# Patient Record
Sex: Female | Born: 1935 | Race: White | Hispanic: No | Marital: Married | Smoking: Never smoker
Health system: Southern US, Community
[De-identification: ages and names within clinical notes are randomized; demographics above are authoritative.]

## PROBLEM LIST (undated history)

## (undated) DIAGNOSIS — E119 Type 2 diabetes mellitus without complications: Secondary | ICD-10-CM

## (undated) DIAGNOSIS — E079 Disorder of thyroid, unspecified: Secondary | ICD-10-CM

## (undated) DIAGNOSIS — H409 Unspecified glaucoma: Secondary | ICD-10-CM

## (undated) DIAGNOSIS — M1990 Unspecified osteoarthritis, unspecified site: Secondary | ICD-10-CM

## (undated) DIAGNOSIS — F32A Depression, unspecified: Secondary | ICD-10-CM

## (undated) DIAGNOSIS — I10 Essential (primary) hypertension: Secondary | ICD-10-CM

## (undated) DIAGNOSIS — F419 Anxiety disorder, unspecified: Secondary | ICD-10-CM

## (undated) DIAGNOSIS — I4891 Unspecified atrial fibrillation: Secondary | ICD-10-CM

## (undated) HISTORY — PX: JOINT REPLACEMENT: SHX530

---

## 1997-12-02 ENCOUNTER — Other Ambulatory Visit: Admission: RE | Admit: 1997-12-02 | Discharge: 1997-12-02 | Payer: Self-pay | Admitting: Obstetrics and Gynecology

## 2004-10-31 ENCOUNTER — Ambulatory Visit: Payer: Self-pay | Admitting: Family Medicine

## 2004-11-23 ENCOUNTER — Ambulatory Visit: Payer: Self-pay | Admitting: Family Medicine

## 2004-12-17 ENCOUNTER — Ambulatory Visit: Payer: Self-pay | Admitting: Family Medicine

## 2005-01-02 ENCOUNTER — Ambulatory Visit: Payer: Self-pay | Admitting: Family Medicine

## 2005-02-12 ENCOUNTER — Ambulatory Visit: Payer: Self-pay | Admitting: Family Medicine

## 2005-04-24 ENCOUNTER — Ambulatory Visit: Payer: Self-pay | Admitting: Family Medicine

## 2005-06-14 ENCOUNTER — Ambulatory Visit: Payer: Self-pay | Admitting: Family Medicine

## 2005-07-25 ENCOUNTER — Ambulatory Visit: Payer: Self-pay | Admitting: Family Medicine

## 2005-08-08 ENCOUNTER — Ambulatory Visit: Payer: Self-pay | Admitting: Family Medicine

## 2005-09-13 ENCOUNTER — Ambulatory Visit: Payer: Self-pay | Admitting: Family Medicine

## 2005-11-08 ENCOUNTER — Ambulatory Visit: Payer: Self-pay | Admitting: Family Medicine

## 2017-06-09 DIAGNOSIS — R531 Weakness: Secondary | ICD-10-CM | POA: Diagnosis not present

## 2017-06-09 DIAGNOSIS — R197 Diarrhea, unspecified: Secondary | ICD-10-CM | POA: Diagnosis not present

## 2017-06-09 DIAGNOSIS — N12 Tubulo-interstitial nephritis, not specified as acute or chronic: Secondary | ICD-10-CM

## 2017-06-09 DIAGNOSIS — N179 Acute kidney failure, unspecified: Secondary | ICD-10-CM

## 2017-06-10 DIAGNOSIS — N179 Acute kidney failure, unspecified: Secondary | ICD-10-CM | POA: Diagnosis not present

## 2017-06-10 DIAGNOSIS — R197 Diarrhea, unspecified: Secondary | ICD-10-CM | POA: Diagnosis not present

## 2017-06-10 DIAGNOSIS — N12 Tubulo-interstitial nephritis, not specified as acute or chronic: Secondary | ICD-10-CM | POA: Diagnosis not present

## 2017-06-11 DIAGNOSIS — E785 Hyperlipidemia, unspecified: Secondary | ICD-10-CM | POA: Diagnosis not present

## 2017-06-11 DIAGNOSIS — I10 Essential (primary) hypertension: Secondary | ICD-10-CM | POA: Diagnosis not present

## 2017-06-11 DIAGNOSIS — N179 Acute kidney failure, unspecified: Secondary | ICD-10-CM | POA: Diagnosis not present

## 2017-06-11 DIAGNOSIS — N12 Tubulo-interstitial nephritis, not specified as acute or chronic: Secondary | ICD-10-CM | POA: Diagnosis not present

## 2017-06-11 DIAGNOSIS — R197 Diarrhea, unspecified: Secondary | ICD-10-CM | POA: Diagnosis not present

## 2017-06-11 DIAGNOSIS — R531 Weakness: Secondary | ICD-10-CM | POA: Diagnosis not present

## 2017-06-11 DIAGNOSIS — E1169 Type 2 diabetes mellitus with other specified complication: Secondary | ICD-10-CM | POA: Diagnosis not present

## 2017-06-12 DIAGNOSIS — E785 Hyperlipidemia, unspecified: Secondary | ICD-10-CM | POA: Diagnosis not present

## 2017-06-12 DIAGNOSIS — N179 Acute kidney failure, unspecified: Secondary | ICD-10-CM | POA: Diagnosis not present

## 2017-06-12 DIAGNOSIS — E1169 Type 2 diabetes mellitus with other specified complication: Secondary | ICD-10-CM | POA: Diagnosis not present

## 2017-06-12 DIAGNOSIS — R197 Diarrhea, unspecified: Secondary | ICD-10-CM | POA: Diagnosis not present

## 2017-06-12 DIAGNOSIS — I10 Essential (primary) hypertension: Secondary | ICD-10-CM | POA: Diagnosis not present

## 2017-06-12 DIAGNOSIS — R531 Weakness: Secondary | ICD-10-CM | POA: Diagnosis not present

## 2017-06-12 DIAGNOSIS — N12 Tubulo-interstitial nephritis, not specified as acute or chronic: Secondary | ICD-10-CM | POA: Diagnosis not present

## 2020-08-24 DIAGNOSIS — I340 Nonrheumatic mitral (valve) insufficiency: Secondary | ICD-10-CM | POA: Diagnosis not present

## 2020-08-24 DIAGNOSIS — I361 Nonrheumatic tricuspid (valve) insufficiency: Secondary | ICD-10-CM | POA: Diagnosis not present

## 2020-08-26 DIAGNOSIS — G459 Transient cerebral ischemic attack, unspecified: Secondary | ICD-10-CM

## 2020-08-30 ENCOUNTER — Emergency Department (HOSPITAL_COMMUNITY): Payer: Medicare Other

## 2020-08-30 ENCOUNTER — Encounter (HOSPITAL_COMMUNITY): Payer: Self-pay | Admitting: *Deleted

## 2020-08-30 ENCOUNTER — Other Ambulatory Visit: Payer: Self-pay

## 2020-08-30 ENCOUNTER — Inpatient Hospital Stay (HOSPITAL_COMMUNITY)
Admission: EM | Admit: 2020-08-30 | Discharge: 2020-09-17 | DRG: 853 | Disposition: A | Discharge status: Skilled Nursing Facility | Payer: Medicare Other | Attending: Family Medicine | Admitting: Family Medicine

## 2020-08-30 DIAGNOSIS — R791 Abnormal coagulation profile: Secondary | ICD-10-CM | POA: Diagnosis present

## 2020-08-30 DIAGNOSIS — I951 Orthostatic hypotension: Secondary | ICD-10-CM | POA: Diagnosis present

## 2020-08-30 DIAGNOSIS — Z7401 Bed confinement status: Secondary | ICD-10-CM

## 2020-08-30 DIAGNOSIS — A419 Sepsis, unspecified organism: Secondary | ICD-10-CM | POA: Diagnosis not present

## 2020-08-30 DIAGNOSIS — Z79890 Hormone replacement therapy (postmenopausal): Secondary | ICD-10-CM

## 2020-08-30 DIAGNOSIS — C3492 Malignant neoplasm of unspecified part of left bronchus or lung: Secondary | ICD-10-CM

## 2020-08-30 DIAGNOSIS — Z79899 Other long term (current) drug therapy: Secondary | ICD-10-CM

## 2020-08-30 DIAGNOSIS — F419 Anxiety disorder, unspecified: Secondary | ICD-10-CM | POA: Insufficient documentation

## 2020-08-30 DIAGNOSIS — Z7982 Long term (current) use of aspirin: Secondary | ICD-10-CM

## 2020-08-30 DIAGNOSIS — R6520 Severe sepsis without septic shock: Secondary | ICD-10-CM

## 2020-08-30 DIAGNOSIS — J42 Unspecified chronic bronchitis: Secondary | ICD-10-CM | POA: Diagnosis present

## 2020-08-30 DIAGNOSIS — M48061 Spinal stenosis, lumbar region without neurogenic claudication: Secondary | ICD-10-CM | POA: Diagnosis present

## 2020-08-30 DIAGNOSIS — R531 Weakness: Secondary | ICD-10-CM

## 2020-08-30 DIAGNOSIS — K5900 Constipation, unspecified: Secondary | ICD-10-CM | POA: Diagnosis present

## 2020-08-30 DIAGNOSIS — Z23 Encounter for immunization: Secondary | ICD-10-CM

## 2020-08-30 DIAGNOSIS — J189 Pneumonia, unspecified organism: Secondary | ICD-10-CM | POA: Diagnosis present

## 2020-08-30 DIAGNOSIS — R059 Cough, unspecified: Secondary | ICD-10-CM

## 2020-08-30 DIAGNOSIS — F32A Depression, unspecified: Secondary | ICD-10-CM | POA: Diagnosis present

## 2020-08-30 DIAGNOSIS — Z8673 Personal history of transient ischemic attack (TIA), and cerebral infarction without residual deficits: Secondary | ICD-10-CM

## 2020-08-30 DIAGNOSIS — W1830XA Fall on same level, unspecified, initial encounter: Secondary | ICD-10-CM | POA: Diagnosis present

## 2020-08-30 DIAGNOSIS — E871 Hypo-osmolality and hyponatremia: Secondary | ICD-10-CM | POA: Diagnosis present

## 2020-08-30 DIAGNOSIS — R609 Edema, unspecified: Secondary | ICD-10-CM

## 2020-08-30 DIAGNOSIS — R52 Pain, unspecified: Secondary | ICD-10-CM

## 2020-08-30 DIAGNOSIS — R937 Abnormal findings on diagnostic imaging of other parts of musculoskeletal system: Secondary | ICD-10-CM

## 2020-08-30 DIAGNOSIS — G8929 Other chronic pain: Secondary | ICD-10-CM | POA: Diagnosis present

## 2020-08-30 DIAGNOSIS — Z7984 Long term (current) use of oral hypoglycemic drugs: Secondary | ICD-10-CM

## 2020-08-30 DIAGNOSIS — I480 Paroxysmal atrial fibrillation: Secondary | ICD-10-CM | POA: Insufficient documentation

## 2020-08-30 DIAGNOSIS — E039 Hypothyroidism, unspecified: Secondary | ICD-10-CM

## 2020-08-30 DIAGNOSIS — F061 Catatonic disorder due to known physiological condition: Secondary | ICD-10-CM | POA: Diagnosis not present

## 2020-08-30 DIAGNOSIS — N390 Urinary tract infection, site not specified: Secondary | ICD-10-CM | POA: Diagnosis present

## 2020-08-30 DIAGNOSIS — E119 Type 2 diabetes mellitus without complications: Secondary | ICD-10-CM | POA: Diagnosis present

## 2020-08-30 DIAGNOSIS — E669 Obesity, unspecified: Secondary | ICD-10-CM | POA: Diagnosis present

## 2020-08-30 DIAGNOSIS — I10 Essential (primary) hypertension: Secondary | ICD-10-CM | POA: Diagnosis present

## 2020-08-30 DIAGNOSIS — M5136 Other intervertebral disc degeneration, lumbar region: Secondary | ICD-10-CM | POA: Diagnosis present

## 2020-08-30 DIAGNOSIS — G3184 Mild cognitive impairment, so stated: Secondary | ICD-10-CM | POA: Diagnosis present

## 2020-08-30 DIAGNOSIS — E785 Hyperlipidemia, unspecified: Secondary | ICD-10-CM | POA: Diagnosis present

## 2020-08-30 DIAGNOSIS — G9341 Metabolic encephalopathy: Secondary | ICD-10-CM | POA: Diagnosis not present

## 2020-08-30 DIAGNOSIS — M79641 Pain in right hand: Secondary | ICD-10-CM

## 2020-08-30 DIAGNOSIS — A4189 Other specified sepsis: Secondary | ICD-10-CM | POA: Diagnosis not present

## 2020-08-30 DIAGNOSIS — I959 Hypotension, unspecified: Secondary | ICD-10-CM

## 2020-08-30 DIAGNOSIS — Z20822 Contact with and (suspected) exposure to covid-19: Secondary | ICD-10-CM | POA: Diagnosis present

## 2020-08-30 DIAGNOSIS — S52611A Displaced fracture of right ulna styloid process, initial encounter for closed fracture: Secondary | ICD-10-CM | POA: Diagnosis present

## 2020-08-30 DIAGNOSIS — R627 Adult failure to thrive: Secondary | ICD-10-CM | POA: Diagnosis present

## 2020-08-30 DIAGNOSIS — M4186 Other forms of scoliosis, lumbar region: Secondary | ICD-10-CM | POA: Diagnosis present

## 2020-08-30 DIAGNOSIS — Z96651 Presence of right artificial knee joint: Secondary | ICD-10-CM | POA: Diagnosis present

## 2020-08-30 DIAGNOSIS — Z66 Do not resuscitate: Secondary | ICD-10-CM | POA: Diagnosis present

## 2020-08-30 HISTORY — DX: Unspecified atrial fibrillation: I48.91

## 2020-08-30 HISTORY — DX: Unspecified osteoarthritis, unspecified site: M19.90

## 2020-08-30 HISTORY — DX: Essential (primary) hypertension: I10

## 2020-08-30 HISTORY — DX: Depression, unspecified: F32.A

## 2020-08-30 HISTORY — DX: Unspecified glaucoma: H40.9

## 2020-08-30 HISTORY — DX: Type 2 diabetes mellitus without complications: E11.9

## 2020-08-30 HISTORY — DX: Anxiety disorder, unspecified: F41.9

## 2020-08-30 HISTORY — DX: Disorder of thyroid, unspecified: E07.9

## 2020-08-30 LAB — COMPREHENSIVE METABOLIC PANEL
ALT: 34 U/L (ref 0–44)
AST: 94 U/L — ABNORMAL HIGH (ref 15–41)
Albumin: 2.7 g/dL — ABNORMAL LOW (ref 3.5–5.0)
Alkaline Phosphatase: 63 U/L (ref 38–126)
Anion gap: 12 (ref 5–15)
BUN: 30 mg/dL — ABNORMAL HIGH (ref 8–23)
CO2: 21 mmol/L — ABNORMAL LOW (ref 22–32)
Calcium: 8.5 mg/dL — ABNORMAL LOW (ref 8.9–10.3)
Chloride: 97 mmol/L — ABNORMAL LOW (ref 98–111)
Creatinine, Ser: 1.25 mg/dL — ABNORMAL HIGH (ref 0.44–1.00)
GFR, Estimated: 42 mL/min — ABNORMAL LOW (ref >60)
Glucose, Bld: 149 mg/dL — ABNORMAL HIGH (ref 70–99)
Potassium: 4.9 mmol/L (ref 3.5–5.1)
Sodium: 130 mmol/L — ABNORMAL LOW (ref 135–145)
Total Bilirubin: 0.8 mg/dL (ref 0.3–1.2)
Total Protein: 6.3 g/dL — ABNORMAL LOW (ref 6.5–8.1)

## 2020-08-30 LAB — CBC WITH DIFFERENTIAL/PLATELET
Abs Immature Granulocytes: 0.11 10*3/uL — ABNORMAL HIGH (ref 0.00–0.07)
Basophils Absolute: 0 10*3/uL (ref 0.0–0.1)
Basophils Relative: 0 %
Eosinophils Absolute: 0 10*3/uL (ref 0.0–0.5)
Eosinophils Relative: 0 %
HCT: 38.7 % (ref 36.0–46.0)
Hemoglobin: 12.8 g/dL (ref 12.0–15.0)
Immature Granulocytes: 1 %
Lymphocytes Relative: 6 %
Lymphs Abs: 1 10*3/uL (ref 0.7–4.0)
MCH: 31.3 pg (ref 26.0–34.0)
MCHC: 33.1 g/dL (ref 30.0–36.0)
MCV: 94.6 fL (ref 80.0–100.0)
Monocytes Absolute: 1.5 10*3/uL — ABNORMAL HIGH (ref 0.1–1.0)
Monocytes Relative: 9 %
Neutro Abs: 14.2 10*3/uL — ABNORMAL HIGH (ref 1.7–7.7)
Neutrophils Relative %: 84 %
Platelets: 210 10*3/uL (ref 150–400)
RBC: 4.09 MIL/uL (ref 3.87–5.11)
RDW: 14.9 % (ref 11.5–15.5)
WBC: 16.9 10*3/uL — ABNORMAL HIGH (ref 4.0–10.5)
nRBC: 0 % (ref 0.0–0.2)

## 2020-08-30 LAB — LACTIC ACID, PLASMA
Lactic Acid, Venous: 2.4 mmol/L (ref 0.5–1.9)
Lactic Acid, Venous: 3 mmol/L (ref 0.5–1.9)

## 2020-08-30 LAB — RESP PANEL BY RT-PCR (FLU A&B, COVID) ARPGX2
Influenza A by PCR: NEGATIVE
Influenza B by PCR: NEGATIVE
SARS Coronavirus 2 by RT PCR: NEGATIVE

## 2020-08-30 LAB — URINALYSIS, ROUTINE W REFLEX MICROSCOPIC
Bilirubin Urine: NEGATIVE
Glucose, UA: 50 mg/dL — AB
Ketones, ur: NEGATIVE mg/dL
Leukocytes,Ua: NEGATIVE
Nitrite: NEGATIVE
Protein, ur: 100 mg/dL — AB
Specific Gravity, Urine: 1.027 (ref 1.005–1.030)
pH: 5 (ref 5.0–8.0)

## 2020-08-30 LAB — PROTIME-INR
INR: 1.3 — ABNORMAL HIGH (ref 0.8–1.2)
Prothrombin Time: 15.5 seconds — ABNORMAL HIGH (ref 11.4–15.2)

## 2020-08-30 LAB — TROPONIN I (HIGH SENSITIVITY)
Troponin I (High Sensitivity): 20 ng/L — ABNORMAL HIGH (ref <18)
Troponin I (High Sensitivity): 22 ng/L — ABNORMAL HIGH (ref <18)

## 2020-08-30 LAB — MAGNESIUM: Magnesium: 2.1 mg/dL (ref 1.7–2.4)

## 2020-08-30 LAB — APTT: aPTT: 25 seconds (ref 24–36)

## 2020-08-30 MED ORDER — SODIUM CHLORIDE 0.9% FLUSH
3.0000 mL | Freq: Two times a day (BID) | INTRAVENOUS | Status: DC
  Administered 2020-08-31 – 2020-09-16 (×32): 3 mL via INTRAVENOUS

## 2020-08-30 MED ORDER — ACETAMINOPHEN 325 MG PO TABS
650.0000 mg | ORAL_TABLET | Freq: Four times a day (QID) | ORAL | Status: DC | PRN
  Administered 2020-09-01 – 2020-09-16 (×20): 650 mg via ORAL
  Filled 2020-08-30 (×21): qty 2

## 2020-08-30 MED ORDER — SODIUM CHLORIDE 0.9 % IV BOLUS
1500.0000 mL | Freq: Once | INTRAVENOUS | Status: AC
  Administered 2020-08-30: 20:00:00 1500 mL via INTRAVENOUS

## 2020-08-30 MED ORDER — LEVOTHYROXINE SODIUM 50 MCG PO TABS
50.0000 ug | ORAL_TABLET | Freq: Every day | ORAL | Status: DC
  Administered 2020-08-31 – 2020-09-17 (×16): 50 ug via ORAL
  Filled 2020-08-30 (×17): qty 1

## 2020-08-30 MED ORDER — ENOXAPARIN SODIUM 40 MG/0.4ML ~~LOC~~ SOLN
40.0000 mg | SUBCUTANEOUS | Status: DC
  Administered 2020-08-30 – 2020-09-03 (×5): 40 mg via SUBCUTANEOUS
  Filled 2020-08-30 (×5): qty 0.4

## 2020-08-30 MED ORDER — CLONAZEPAM 0.5 MG PO TABS
0.5000 mg | ORAL_TABLET | Freq: Every day | ORAL | Status: DC
  Administered 2020-08-30 – 2020-09-05 (×7): 0.5 mg via ORAL
  Filled 2020-08-30 (×7): qty 1

## 2020-08-30 MED ORDER — SODIUM CHLORIDE 0.9 % IV SOLN
2.0000 g | Freq: Two times a day (BID) | INTRAVENOUS | Status: DC
  Administered 2020-08-30 – 2020-09-02 (×6): 2 g via INTRAVENOUS
  Filled 2020-08-30 (×6): qty 2

## 2020-08-30 MED ORDER — SODIUM CHLORIDE 0.9 % IV BOLUS (SEPSIS)
1000.0000 mL | Freq: Once | INTRAVENOUS | Status: AC
  Administered 2020-08-30: 20:00:00 1000 mL via INTRAVENOUS

## 2020-08-30 MED ORDER — POLYETHYLENE GLYCOL 3350 17 G PO PACK
17.0000 g | PACK | Freq: Every day | ORAL | Status: DC | PRN
  Administered 2020-09-04 – 2020-09-14 (×5): 17 g via ORAL
  Filled 2020-08-30 (×4): qty 1

## 2020-08-30 MED ORDER — ACETAMINOPHEN 650 MG RE SUPP: 650.0000 mg | Freq: Four times a day (QID) | RECTAL | Status: DC | PRN

## 2020-08-30 MED ORDER — CARVEDILOL 3.125 MG PO TABS
3.1250 mg | ORAL_TABLET | Freq: Two times a day (BID) | ORAL | Status: DC
  Administered 2020-08-31 – 2020-09-17 (×34): 3.125 mg via ORAL
  Filled 2020-08-30 (×37): qty 1

## 2020-08-30 MED ORDER — INSULIN ASPART 100 UNIT/ML ~~LOC~~ SOLN
0.0000 [IU] | Freq: Three times a day (TID) | SUBCUTANEOUS | Status: DC
  Administered 2020-08-31: 17:00:00 2 [IU] via SUBCUTANEOUS
  Administered 2020-08-31: 08:00:00 3 [IU] via SUBCUTANEOUS
  Administered 2020-09-01 (×2): 2 [IU] via SUBCUTANEOUS
  Administered 2020-09-01: 13:00:00 5 [IU] via SUBCUTANEOUS
  Administered 2020-09-02 (×3): 2 [IU] via SUBCUTANEOUS
  Administered 2020-09-03: 19:00:00 3 [IU] via SUBCUTANEOUS
  Administered 2020-09-03: 10:00:00 2 [IU] via SUBCUTANEOUS
  Administered 2020-09-03: 13:00:00 3 [IU] via SUBCUTANEOUS
  Administered 2020-09-04: 14:00:00 5 [IU] via SUBCUTANEOUS
  Administered 2020-09-04: 08:00:00 2 [IU] via SUBCUTANEOUS
  Administered 2020-09-05 (×2): 3 [IU] via SUBCUTANEOUS
  Administered 2020-09-05: 09:00:00 2 [IU] via SUBCUTANEOUS
  Administered 2020-09-06: 14:00:00 11 [IU] via SUBCUTANEOUS
  Administered 2020-09-06 – 2020-09-07 (×2): 3 [IU] via SUBCUTANEOUS
  Administered 2020-09-07: 18:00:00 8 [IU] via SUBCUTANEOUS
  Administered 2020-09-07 – 2020-09-08 (×2): 5 [IU] via SUBCUTANEOUS
  Administered 2020-09-08 – 2020-09-09 (×3): 3 [IU] via SUBCUTANEOUS
  Administered 2020-09-10 (×2): 2 [IU] via SUBCUTANEOUS
  Administered 2020-09-10: 5 [IU] via SUBCUTANEOUS
  Administered 2020-09-11: 2 [IU] via SUBCUTANEOUS
  Administered 2020-09-11: 3 [IU] via SUBCUTANEOUS
  Administered 2020-09-12: 2 [IU] via SUBCUTANEOUS
  Administered 2020-09-12: 3 [IU] via SUBCUTANEOUS
  Administered 2020-09-12 – 2020-09-13 (×2): 2 [IU] via SUBCUTANEOUS
  Administered 2020-09-13: 3 [IU] via SUBCUTANEOUS
  Administered 2020-09-13 – 2020-09-14 (×2): 2 [IU] via SUBCUTANEOUS
  Administered 2020-09-14: 3 [IU] via SUBCUTANEOUS
  Administered 2020-09-14 – 2020-09-17 (×7): 2 [IU] via SUBCUTANEOUS

## 2020-08-30 MED ORDER — LACTATED RINGERS IV SOLN: INTRAVENOUS | Status: AC

## 2020-08-30 NOTE — ED Provider Notes (Signed)
[name] EMERGENCY DEPARTMENT Provider Note   CSN: [id number] Arrival date & time: 08/30/20  1703     History CC:  Confusion, hypotension  Ashlee Greer is a 86 y.o. female with a history of A. fib present emergency department with hypotension and weakness.  Patient cannot provide a reliable history, but her daughter is present at bedside and does provide additional history.  Her daughter reports the patient was discharged from [facility] 1 day ago, after being admitted and treated for TIA, as well as pneumonia and UTI.  Her daughter reports the patient had a CT scan and MRI of the brain which was "normal".  The patient was also noted while in the hospital to have profound orthostatic hypotension, and had been referred for SNF placement, but the family instead felt they can manage it at home.  However upon discharge home, they noted the patient blood pressure dropped severely when she was attempting to stand up during PT, to the point the patient had syncope today.  Her daughter is a Marine scientist and states that she was not prepared for this level of management and believes the patient needed further management in the hospital before stabilization for discharge.  Her daughter also reports me the patient is being treated with Levaquin for pneumonia as well as Bactrim for cystitis while in the hospital.  The patient has a history of A. fib and her daughter believes that she is in A. fib all the time, reports that she was in A. fib throughout her hospital stay at [facility].  She states the patient is not currently taking any anticoagulation, although she had been Eliquis in the past.  He said the doctors to discuss this but decided not to initiate any further anticoagulation while in the hospital.  Her daughter confirms the only blood pressure medication the patient on his Coreg 3.125 mg BID which she has been compliant with.  HPI     Past Medical History:  Diagnosis Date   . A-fib ([place])   . Anxiety   . Arthritis   . Depression   . Diabetes mellitus without complication ([place])   . Glaucoma   . Hypertension   . Thyroid disease    hypo    Patient Active Problem List   Diagnosis Date Noted  . Severe sepsis ([place]) 08/30/2020  . Acute lower UTI 08/30/2020  . Pneumonia 08/30/2020  . Orthostatic hypotension 08/30/2020  . History of TIA (transient ischemic attack) 08/30/2020  . Unspecified atrial fibrillation ([place]) 08/30/2020  . Anxiety 08/30/2020  . Depression 08/30/2020  . Diabetes ([place]) 08/30/2020  . Hypothyroidism 08/30/2020     OB History   No obstetric history on file.     No family history on file.  Social History   Tobacco Use  . Smoking status: Never Smoker  . Smokeless tobacco: Never Used  Substance Use Topics  . Alcohol use: Not Currently  . Drug use: Not Currently    Home Medications Prior to Admission medications   Medication Sig Start Date End Date Taking? Authorizing Provider  aspirin 325 MG tablet Take 325 mg by mouth at bedtime.   Yes [provider]  carvedilol (COREG) 3.125 MG tablet Take 3.125 mg by mouth 2 (two) times daily with a meal.   Yes [provider]  citalopram (CELEXA) 40 MG tablet Take 40 mg by mouth at bedtime.   Yes [provider]  clonazePAM (KLONOPIN) 2 MG tablet Take 2 mg by mouth  at bedtime.   Yes [provider]  donepezil (ARICEPT) 10 MG tablet Take 10 mg by mouth at bedtime.   Yes [provider]  latanoprost (XALATAN) 0.005 % ophthalmic solution Place 1 drop into both eyes at bedtime.   Yes [provider]  levofloxacin (LEVAQUIN) 500 MG tablet Take 500 mg by mouth daily.   Yes [provider]  levothyroxine (SYNTHROID) 50 MCG tablet Take 50 mcg by mouth daily before breakfast.   Yes [provider]  metFORMIN (GLUCOPHAGE) 1000 MG tablet Take 1,000 mg by mouth 2 (two) times daily with a meal.   Yes [provider]   nortriptyline (PAMELOR) 10 MG capsule Take 20 mg by mouth at bedtime.   Yes [provider]  pantoprazole (PROTONIX) 40 MG tablet Take 40 mg by mouth daily.   Yes [provider]  psyllium (METAMUCIL) 58.6 % packet Take 1 packet by mouth at bedtime.   Yes [provider]  sulfamethoxazole-trimethoprim (BACTRIM DS) 800-160 MG tablet Take 1 tablet by mouth 2 (two) times daily.   Yes [provider]    Allergies    Nisoldipine, Penicillin g, Ace inhibitors, and Eliquis [apixaban]  Review of Systems   Review of Systems  Unable to perform ROS: Mental status change (level 5 caveat)    Physical Exam Updated Vital Signs BP (!) 132/96   Pulse (!) 108   Temp 98.4 F (36.9 C) (Oral)   Resp 19   Ht 5\' 1"  (1.549 m)   Wt 88.5 kg   SpO2 100%   BMI 36.84 kg/m   Physical Exam Vitals and nursing note reviewed.  Constitutional:      General: She is not in acute distress.    Appearance: She is well-developed and well-nourished.  HENT:     Head: Normocephalic and atraumatic.  Eyes:     Conjunctiva/sclera: Conjunctivae normal.  Cardiovascular:     Rate and Rhythm: Tachycardia present. Rhythm irregular.     Pulses: Normal pulses.     Comments: HR 105 Pulmonary:     Effort: Pulmonary effort is normal. No respiratory distress.     Breath sounds: Normal breath sounds.  Abdominal:     General: There is no distension.     Palpations: Abdomen is soft.     Tenderness: There is no abdominal tenderness.  Musculoskeletal:        General: No edema.     Cervical back: Neck supple.  Skin:    General: Skin is warm and dry.  Neurological:     General: No focal deficit present.     Mental Status: She is alert and oriented to person, place, and time.  Psychiatric:        Mood and Affect: Mood and affect normal.     ED Results / Procedures / Treatments   Labs (all labs ordered are listed, but only abnormal results are displayed) Labs Reviewed  LACTIC ACID,  PLASMA - Abnormal; Notable for the following components:      Result Value   Lactic Acid, Venous 2.4 (*)    All other components within normal limits  LACTIC ACID, PLASMA - Abnormal; Notable for the following components:   Lactic Acid, Venous 3.0 (*)    All other components within normal limits  COMPREHENSIVE METABOLIC PANEL - Abnormal; Notable for the following components:   Sodium 130 (*)    Chloride 97 (*)    CO2 21 (*)    Glucose, Bld 149 (*)  BUN 30 (*)    Creatinine, Ser 1.25 (*)    Calcium 8.5 (*)    Total Protein 6.3 (*)    Albumin 2.7 (*)    AST 94 (*)    GFR, Estimated 42 (*)    All other components within normal limits  CBC WITH DIFFERENTIAL/PLATELET - Abnormal; Notable for the following components:   WBC 16.9 (*)    Neutro Abs 14.2 (*)    Monocytes Absolute 1.5 (*)    Abs Immature Granulocytes 0.11 (*)    All other components within normal limits  PROTIME-INR - Abnormal; Notable for the following components:   Prothrombin Time 15.5 (*)    INR 1.3 (*)    All other components within normal limits  URINALYSIS, ROUTINE W REFLEX MICROSCOPIC - Abnormal; Notable for the following components:   Color, Urine AMBER (*)    APPearance HAZY (*)    Glucose, UA 50 (*)    Hgb urine dipstick SMALL (*)    Protein, ur 100 (*)    Bacteria, UA RARE (*)    All other components within normal limits  TROPONIN I (HIGH SENSITIVITY) - Abnormal; Notable for the following components:   Troponin I (High Sensitivity) 20 (*)    All other components within normal limits  TROPONIN I (HIGH SENSITIVITY) - Abnormal; Notable for the following components:   Troponin I (High Sensitivity) 22 (*)    All other components within normal limits  CULTURE, BLOOD (SINGLE)  RESP PANEL BY RT-PCR (FLU A&B, COVID) ARPGX2  URINE CULTURE  CULTURE, BLOOD (SINGLE)  MRSA PCR SCREENING  APTT  MAGNESIUM  PROTIME-INR  PROCALCITONIN  TSH  COMPREHENSIVE METABOLIC PANEL  CBC  LACTIC ACID, PLASMA  LACTIC ACID,  PLASMA  OSMOLALITY    EKG EKG Interpretation  Date/Time:  Wednesday August 30 2020 17:20:07 EST Ventricular Rate:  108 PR Interval:    QRS Duration: 86 QT Interval:  320 QTC Calculation: 421 R Axis:   3 Text Interpretation: Atrial fibrillation Ventricular premature complex Borderline low voltage, extremity leads No STEMI Confirmed by Octaviano Glow [phone]) on 08/30/2020 5:23:51 PM   Radiology DG Chest Port 1 View  Result Date: 08/30/2020 CLINICAL DATA:  Sepsis. EXAM: PORTABLE CHEST 1 VIEW COMPARISON:  08/28/2020 FINDINGS: Cardiomegaly remains stable. Mild infiltrate or atelectasis in the left lung base appears increased since previous study. Linear opacity at the right lung base is stable and may be due to subsegmental atelectasis or scarring. IMPRESSION: Increased mild left basilar infiltrate or atelectasis. Stable right basilar atelectasis versus scarring. Stable cardiomegaly. Electronically Signed   By: Marlaine Hind M.D.   On: 08/30/2020 18:27    Procedures .Critical Care Performed by: Wyvonnia Dusky, MD Authorized by: Wyvonnia Dusky, MD   Critical care provider statement:    Critical care time (minutes):  45   Critical care was necessary to treat or prevent imminent or life-threatening deterioration of the following conditions:  Sepsis   Critical care was time spent personally by me on the following activities:  Discussions with consultants, evaluation of patient's response to treatment, examination of patient, ordering and performing treatments and interventions, ordering and review of laboratory studies, ordering and review of radiographic studies, pulse oximetry, re-evaluation of patient's condition, obtaining history from patient or surrogate and review of old charts   (including critical care time)  Medications Ordered in ED Medications  lactated ringers infusion (0 mLs Intravenous Hold 08/30/20 2103)  ceFEPIme (MAXIPIME) 2 g in sodium chloride 0.9 % 100  mL  IVPB (0 g Intravenous Stopped 08/30/20 2142)  enoxaparin (LOVENOX) injection 40 mg (40 mg Subcutaneous Given 08/30/20 2102)  sodium chloride flush (NS) 0.9 % injection 3 mL (3 mLs Intravenous Not Given 08/30/20 2030)  acetaminophen (TYLENOL) tablet 650 mg (has no administration in time range)    Or  acetaminophen (TYLENOL) suppository 650 mg (has no administration in time range)  polyethylene glycol (MIRALAX / GLYCOLAX) packet 17 g (has no administration in time range)  insulin aspart (novoLOG) injection 0-15 Units (has no administration in time range)  carvedilol (COREG) tablet 3.125 mg (has no administration in time range)  levothyroxine (SYNTHROID) tablet 50 mcg (has no administration in time range)  clonazePAM (KLONOPIN) tablet 0.5 mg (0 mg Oral Hold 08/30/20 2119)  sodium chloride 0.9 % bolus 1,000 mL (0 mLs Intravenous Stopped 08/30/20 2103)  sodium chloride 0.9 % bolus 1,500 mL (1,500 mLs Intravenous New Bag/Given 08/30/20 2011)    ED Course  I have reviewed the triage vital signs and the nursing notes.  Pertinent labs & imaging results that were available during my care of the patient were reviewed by me and considered in my medical decision making (see chart for details).  This patient presents with concern for fever, fatigue, weakness. This involves an extensive number of treatment options, and is a complaint that carries with it a high risk of complications and morbidity.  The differential diagnosis includes sepsis (poss 2/2 PNA or UTI) vs Covid-19 vs orthostatic hypotension vs ACS vs other  She is in A Fib now HR 105 bpm, BP stable.  This may be chronic per her medical hx and family's report.  Febrile on arrival - will check lactate, if elevated consider sepsis initiation.  Covid also ordered.  I ordered, reviewed, and interpreted labs, which included WBC 16.9, lactate 2.4, Trop 20 --> 22, Cr 1.25, BUN 30 I ordered medication IV cefepime for sepsis coverage (suspected urinary  or pulmonary source, PCN allergies) I ordered imaging studies which included dg chest I independently visualized and interpreted imaging which showed basilar atelectasis and the monitor tracing which showed A Fib Additional history was obtained from patient's daughter at bedside   After the interventions stated above, I reevaluated the patient and found HR and BP stable.   Clinical Course as of 08/30/20 2259  Wed Aug 30, 2020  1722 No answer from Romie Levee emergency contact when I tried calling.  Patient's PCP office note from August 2021 has only Coreg 3.125 mg BID listed as HTN medication.  No blood thinners shown.  I do not have access to her hospital records from [facility] at this time. [MT]  1946 Daughter now at bedside, updated contact info, additional hx added to note.  With lactate elevated and WBC 16.9, patient made sepsis alert, 30 cc/kg NS bolus ordered, IV zosyn ordered for PNA and UTI coverage as suspected sources.   [MT]  2002 Pt and daughter updated regarding w/u and admission for sepsis w/u [MT]    Clinical Course User Index [MT] Langston Masker Carola Rhine, MD    Final Clinical Impression(s) / ED Diagnoses Final diagnoses:  Sepsis, due to unspecified organism, unspecified whether acute organ dysfunction present ([place])  Hypotension, unspecified hypotension type  Weakness    Rx / DC Orders ED Discharge Orders    None       Wyvonnia Dusky, MD 08/30/20 2259

## 2020-08-30 NOTE — Progress Notes (Signed)
Pharmacy Antibiotic Note  Ashlee Greer is a 86 y.o. female admitted on 08/30/2020 with sepsis.  Pharmacy has been consulted for Cefepime dosing.  WBC elevated at 16.9. LA 2.4. SCr 1.25.   Plan: -Cefepime 2 gm IV Q 12 hours -Monitor CBC, renal fx, cultures and clinical progress  Height: 5\' 1"  (154.9 cm) Weight: 88.5 kg (195 lb) IBW/kg (Calculated) : 47.8  Temp (24hrs), Avg:99.9 F (37.7 C), Min:98.4 F (36.9 C), Max:101.4 F (38.6 C)  Recent Labs  Lab 08/30/20 1833  WBC 16.9*  CREATININE 1.25*  LATICACIDVEN 2.4*    Estimated Creatinine Clearance: 33.3 mL/min (A) (by C-G formula based on SCr of 1.25 mg/dL (H)).    Allergies  Allergen Reactions  . Ace Inhibitors Other (See Comments)    HYPERKALEMIA  . Eliquis [Apixaban]   . Nisoldipine Swelling  . Penicillin G Swelling    Antimicrobials this admission: Cefepime 12/22 >>   Dose adjustments this admission:  Microbiology results: 12/22 BCx:  12/22 UCx:     Thank you for allowing pharmacy to be a part of this patient's care.  Albertina Parr, PharmD., BCPS, BCCCP Clinical Pharmacist Please refer to [facility] for unit-specific pharmacist

## 2020-08-30 NOTE — H&P (Addendum)
History and Physical   Ashlee Greer EGB:[id number] DOB: Feb 07, 1933 DOA: 08/30/2020  PCP: [name], MD   Patient coming from: Her family's home  Chief Complaint: Syncope, orthostatic hypotension  HPI: Ashlee Greer is a 86 y.o. female with medical history significant of A. fib not on anticoagulation, anxiety/depression, arthritis, diabetes, glaucoma, hypertension, hypothyroidism who presents with recurrent loss of consciousness secondary to orthostatic hypotension.  Patient was recently discharged from [facility] after treatment for TIA, UTI, pneumonia.  She also had some fatigue and orthostasis while she was there.  She was discharged on Bactrim for UTI and Levaquin for her pneumonia.  She was recommended to go to an SNF at discharge but family is an Therapist, sports and thought they could handle her at home.  Family states that however protested her discharged and got an extra day because of this prior to discharge.  However, after returning home, her blood pressures consistently dropped on standing and has gotten so bad that she had episodes of syncope while working with PT.  They came to [facility] the preferred not to go back to [facility].  Patient alert and oriented to self and situation in ED.  History obtained with assistance of patient's daughter.  Patient reports some chest pain though this is recurrent for her.  She also reports cough.  She denies abdominal pain, shortness of breath, subjective fever, constipation, diarrhea, nausea.  ED Course: Vital signs in the ED significant for temperature 101.4, respiratory rate in the 20s, heart rate in the 100s, blood pressure in the 371G to 626 systolic and saturating well.  Chest x-ray showed increased left basilar infiltrate versus atelectasis.  BMP showed sodium of 130, creatinine 1.25 unclear baseline, glucose 149.  LFTs showed AST 94, calcium 8.5 which corrects with albumin of 2.7, protein 6.3.  CBC showed leukocytosis to 16.9.  PT/INR  elevated to 15.5 and 1.3 respectively.  PTT normal.  Lactic acid elevated at 2.4 and 3 on repeat prior to IV fluid bolus.  Troponin mildly elevated at 20 and 22 on repeat.  Respiratory panel for flu and Covid negative.  Pending labs include urine culture, urinalysis, blood culture.  Patient started on cefepime and given IV fluid boluses and consult placed for admission.  Review of Systems: As per HPI otherwise all other systems reviewed and are negative.  Past Medical History:  Diagnosis Date  . A-fib ([place])   . Anxiety   . Arthritis   . Depression   . Diabetes mellitus without complication ([place])   . Glaucoma   . Hypertension   . Thyroid disease    hypo    Past Surgical History:  Procedure Laterality Date  . JOINT REPLACEMENT Right    knee replacement    Social History  reports that she has never smoked. She has never used smokeless tobacco. She reports previous alcohol use. She reports previous drug use.  Allergies  Allergen Reactions  . Ace Inhibitors Other (See Comments)    HYPERKALEMIA  . Eliquis [Apixaban]     Altered mental status   . Nisoldipine Swelling  . Penicillin G Swelling    No family history on file. Reviewed admission  Prior to Admission medications   Not on File    Physical Exam: Vitals:   08/30/20 1748 08/30/20 1930 08/30/20 1945  BP:  111/74 (!) 143/87  Pulse:  (!) 126 (!) 107  Resp: (!) 27 19 (!) 22  Temp: (!) 101.4 F (38.6 C)  98.4 F (36.9  C)  TempSrc: Rectal  Oral  SpO2:  98% 94%  Weight: 88.5 kg    Height: 5\' 1"  (1.549 m)     Physical Exam Constitutional:      General: She is not in acute distress.    Appearance: Normal appearance. She is obese.  HENT:     Head: Normocephalic and atraumatic.     Mouth/Throat:     Mouth: Mucous membranes are moist.     Pharynx: Oropharynx is clear.  Eyes:     Extraocular Movements: Extraocular movements intact.     Pupils: Pupils are equal, round, and reactive to light.  Cardiovascular:      Rate and Rhythm: Tachycardia present. Rhythm irregular.     Pulses: Normal pulses.     Heart sounds: Normal heart sounds.  Pulmonary:     Effort: Pulmonary effort is normal. No respiratory distress.     Comments: Trace rhonchi at left base Abdominal:     General: Bowel sounds are normal. There is no distension.     Palpations: Abdomen is soft.     Tenderness: There is no abdominal tenderness.  Musculoskeletal:        General: No swelling or deformity.  Skin:    General: Skin is warm and dry.  Neurological:     General: No focal deficit present.     Mental Status: Mental status is at baseline.    Labs on Admission: I have personally reviewed following labs and imaging studies  CBC: Recent Labs  Lab 08/30/20 1833  WBC 16.9*  NEUTROABS 14.2*  HGB 12.8  HCT 38.7  MCV 94.6  PLT 814    Basic Metabolic Panel: Recent Labs  Lab 08/30/20 1833  NA 130*  K 4.9  CL 97*  CO2 21*  GLUCOSE 149*  BUN 30*  CREATININE 1.25*  CALCIUM 8.5*  MG 2.1    GFR: Estimated Creatinine Clearance: 33.3 mL/min (A) (by C-G formula based on SCr of 1.25 mg/dL (H)).  Liver Function Tests: Recent Labs  Lab 08/30/20 1833  AST 94*  ALT 34  ALKPHOS 63  BILITOT 0.8  PROT 6.3*  ALBUMIN 2.7*    Urine analysis:    Component Value Date/Time   COLORURINE AMBER (A) 08/30/2020 1833   APPEARANCEUR HAZY (A) 08/30/2020 1833   LABSPEC 1.027 08/30/2020 1833   PHURINE 5.0 08/30/2020 1833   GLUCOSEU 50 (A) 08/30/2020 1833   HGBUR SMALL (A) 08/30/2020 1833   BILIRUBINUR NEGATIVE 08/30/2020 1833   [place] 08/30/2020 1833   PROTEINUR 100 (A) 08/30/2020 1833   NITRITE NEGATIVE 08/30/2020 1833   LEUKOCYTESUR NEGATIVE 08/30/2020 1833    Radiological Exams on Admission: DG Chest Port 1 View  Result Date: 08/30/2020 CLINICAL DATA:  Sepsis. EXAM: PORTABLE CHEST 1 VIEW COMPARISON:  08/28/2020 FINDINGS: Cardiomegaly remains stable. Mild infiltrate or atelectasis in the left lung base  appears increased since previous study. Linear opacity at the right lung base is stable and may be due to subsegmental atelectasis or scarring. IMPRESSION: Increased mild left basilar infiltrate or atelectasis. Stable right basilar atelectasis versus scarring. Stable cardiomegaly. Electronically Signed   By: Marlaine Hind M.D.   On: 08/30/2020 18:27    EKG: Independently reviewed.  Atrial fibrillation with a rate of108 bpm.  Low voltage.  Assessment/Plan Principal Problem:   Severe sepsis (HCC) Active Problems:   Acute lower UTI   Pneumonia   Orthostatic hypotension   History of TIA (transient ischemic attack)   Unspecified atrial fibrillation ([place])  Anxiety   Depression   Diabetes ([place])   Hypothyroidism   UTI Pneumonia Severe sepsis > Patient recently discharged from outside hospital where problems included UTI and pneumonia > Has been discharged on Bactrim for UTI and Levaquin for pneumonia but has fever continues to be on hold. > We treated for severe sepsis with fever of 100.4, leukocytosis to 16.9, tachypnea in the 20s, tachycardia in the 100s, lactic acid elevated to 2.4 and 3 indicating or dysfunction. > Source is urinary versus pulmonary > Status post 2.5 L bolus in ED, lactic acid increased from 2.4 to 3, however repeat lactic acid was checked before IV fluid bolus was given. - Telemetry - Continue cefepime started in ED - MRSA PCR screen - Continue IV fluids - Repeat lactic acid and trend - Follow PT/INR - Follow-up urinalysis, urine cultures, blood cultures - Trend fever curve and white count  Orthostatic hypotension > History of this at recent hospitalization, had severe episodes when discharged likely worsening in the setting of sepsis as above. > No recurrent syncopal episodes, will not repeat orthostatics at this time - We will need follow-up orthostatics prior to discharge  History of TIA - Recent work-up at outside hospital  Hypertension Atrial  fibrillation > Not on anticoagulation, despite recent TIA work-up, do not have records from this. - Continue home Coreg  CKD Hypovolemic hyponatremia > Creatinine is elevated to 1.25, daughter states that she has had known renal insufficiency.  This is likely her renal insufficiency/CKD. > Hyponatremia likely due to decreased volume, will trend, can consider checking Legionella urinary antigen considering concurrent pneumonia. - Avoid nephrotoxic agents - Trend renal function and electrolytes - Check serum Osm  Anxiety and depression > Daughter states she does very poorly she does not get her evening clonazepam > Awaiting med rec for details of medications and doses - Continue lower dose clonazepam nightly  Diabetes > On Metformin at home - SSI  Hypothyroidism - Continue home Synthroid  DVT prophylaxis: Lovenox  Code Status:   Partial, no ACLS, no CPR.  Intubation okay. Family Communication:  Discussed with daughter at bedside. Disposition Plan:   Patient is from:  Home  Anticipated DC to:  Nursing facility  Anticipated DC date:  Pending clinical course  Anticipated DC barriers: None  Consults called:  None  Admission status:  Observation, telemetry   Severity of Illness: The appropriate patient status for this patient is OBSERVATION. Observation status is judged to be reasonable and necessary in order to provide the required intensity of service to ensure the patient's safety. The patient's presenting symptoms, physical exam findings, and initial radiographic and laboratory data in the context of their medical condition is felt to place them at decreased risk for further clinical deterioration. Furthermore, it is anticipated that the patient will be medically stable for discharge from the hospital within 2 midnights of admission. The following factors support the patient status of observation.   " The patient's presenting symptoms include orthostatic hypotension, fever,  tachypnea, tachycardia. " The physical exam findings include obesity, mild rhonchi left base otherwise stable exam. " The initial radiographic and laboratory data are concerning for worsening infiltrate, elevated lactic acid, elevated white count, hyponatremia, elevated creatinine.   Marcelyn Bruins MD [facility]  How to contact the [facility] Attending or Consulting provider [place] or covering provider during after hours [place], for this patient?   1. Check the care team in [facility] and look for a) attending/consulting TR[facility] provider listed  and b) the [facility] team listed 2. Log into [email or web address] and use [place]'s universal password to access. If you do not have the password, please contact the hospital operator. 3. Locate the [facility] provider you are looking for under [facility] and page to a number that you can be directly reached. 4. If you still have difficulty reaching the provider, please page the [facility] (Director on Call) for the Hospitalists listed on amion for assistance.  08/30/2020, 9:00 PM

## 2020-08-30 NOTE — ED Notes (Signed)
Pt restated to 88% on room air. Was repositioned and sat up with no improvement in spo2 level. Pt placed on 2 L supplemental oxygen spo2 increased to 94%

## 2020-08-30 NOTE — ED Triage Notes (Signed)
Pt recently discharged from [facility] yesterday, after being tx for tia, uti and pnx.  Pt had multiple episodes of losing consciousness today, when she stood up.  Pt alert to self and situation presently.  CBG 225.

## 2020-08-30 NOTE — Sepsis Progress Note (Addendum)
Following for code sepsis monitoring. Lactic acid is rising. Repeat lactic acid trends are ordered.   Notified bedside nurse of need to draw repeat lactic acid.  Difficult stick. Continued attempts being made

## 2020-08-31 DIAGNOSIS — R4182 Altered mental status, unspecified: Secondary | ICD-10-CM | POA: Diagnosis not present

## 2020-08-31 DIAGNOSIS — M79609 Pain in unspecified limb: Secondary | ICD-10-CM | POA: Diagnosis not present

## 2020-08-31 DIAGNOSIS — Z7984 Long term (current) use of oral hypoglycemic drugs: Secondary | ICD-10-CM | POA: Diagnosis not present

## 2020-08-31 DIAGNOSIS — E785 Hyperlipidemia, unspecified: Secondary | ICD-10-CM | POA: Diagnosis present

## 2020-08-31 DIAGNOSIS — F061 Catatonic disorder due to known physiological condition: Secondary | ICD-10-CM | POA: Diagnosis not present

## 2020-08-31 DIAGNOSIS — E039 Hypothyroidism, unspecified: Secondary | ICD-10-CM | POA: Diagnosis present

## 2020-08-31 DIAGNOSIS — E119 Type 2 diabetes mellitus without complications: Secondary | ICD-10-CM | POA: Diagnosis present

## 2020-08-31 DIAGNOSIS — F419 Anxiety disorder, unspecified: Secondary | ICD-10-CM | POA: Diagnosis present

## 2020-08-31 DIAGNOSIS — C3492 Malignant neoplasm of unspecified part of left bronchus or lung: Secondary | ICD-10-CM | POA: Diagnosis present

## 2020-08-31 DIAGNOSIS — A419 Sepsis, unspecified organism: Secondary | ICD-10-CM | POA: Diagnosis not present

## 2020-08-31 DIAGNOSIS — R791 Abnormal coagulation profile: Secondary | ICD-10-CM | POA: Diagnosis present

## 2020-08-31 DIAGNOSIS — G9341 Metabolic encephalopathy: Secondary | ICD-10-CM | POA: Diagnosis not present

## 2020-08-31 DIAGNOSIS — Z7982 Long term (current) use of aspirin: Secondary | ICD-10-CM | POA: Diagnosis not present

## 2020-08-31 DIAGNOSIS — W1830XA Fall on same level, unspecified, initial encounter: Secondary | ICD-10-CM | POA: Diagnosis not present

## 2020-08-31 DIAGNOSIS — J189 Pneumonia, unspecified organism: Secondary | ICD-10-CM | POA: Diagnosis present

## 2020-08-31 DIAGNOSIS — I480 Paroxysmal atrial fibrillation: Secondary | ICD-10-CM | POA: Diagnosis not present

## 2020-08-31 DIAGNOSIS — N390 Urinary tract infection, site not specified: Secondary | ICD-10-CM | POA: Diagnosis present

## 2020-08-31 DIAGNOSIS — Z66 Do not resuscitate: Secondary | ICD-10-CM | POA: Diagnosis present

## 2020-08-31 DIAGNOSIS — Z96651 Presence of right artificial knee joint: Secondary | ICD-10-CM | POA: Diagnosis present

## 2020-08-31 DIAGNOSIS — F32A Depression, unspecified: Secondary | ICD-10-CM | POA: Diagnosis present

## 2020-08-31 DIAGNOSIS — Z8673 Personal history of transient ischemic attack (TIA), and cerebral infarction without residual deficits: Secondary | ICD-10-CM | POA: Diagnosis not present

## 2020-08-31 DIAGNOSIS — R627 Adult failure to thrive: Secondary | ICD-10-CM | POA: Diagnosis present

## 2020-08-31 DIAGNOSIS — Z23 Encounter for immunization: Secondary | ICD-10-CM | POA: Diagnosis present

## 2020-08-31 DIAGNOSIS — S52611A Displaced fracture of right ulna styloid process, initial encounter for closed fracture: Secondary | ICD-10-CM | POA: Diagnosis present

## 2020-08-31 DIAGNOSIS — R531 Weakness: Secondary | ICD-10-CM | POA: Diagnosis present

## 2020-08-31 DIAGNOSIS — M7989 Other specified soft tissue disorders: Secondary | ICD-10-CM | POA: Diagnosis not present

## 2020-08-31 DIAGNOSIS — Z7401 Bed confinement status: Secondary | ICD-10-CM | POA: Diagnosis not present

## 2020-08-31 DIAGNOSIS — E871 Hypo-osmolality and hyponatremia: Secondary | ICD-10-CM | POA: Diagnosis present

## 2020-08-31 DIAGNOSIS — Z79899 Other long term (current) drug therapy: Secondary | ICD-10-CM | POA: Diagnosis not present

## 2020-08-31 DIAGNOSIS — R6520 Severe sepsis without septic shock: Secondary | ICD-10-CM | POA: Diagnosis not present

## 2020-08-31 DIAGNOSIS — A4189 Other specified sepsis: Secondary | ICD-10-CM | POA: Diagnosis present

## 2020-08-31 DIAGNOSIS — Z20822 Contact with and (suspected) exposure to covid-19: Secondary | ICD-10-CM | POA: Diagnosis present

## 2020-08-31 DIAGNOSIS — R918 Other nonspecific abnormal finding of lung field: Secondary | ICD-10-CM | POA: Diagnosis not present

## 2020-08-31 DIAGNOSIS — Z79890 Hormone replacement therapy (postmenopausal): Secondary | ICD-10-CM | POA: Diagnosis not present

## 2020-08-31 DIAGNOSIS — Z01818 Encounter for other preprocedural examination: Secondary | ICD-10-CM | POA: Diagnosis not present

## 2020-08-31 LAB — COMPREHENSIVE METABOLIC PANEL
ALT: 33 U/L (ref 0–44)
AST: 85 U/L — ABNORMAL HIGH (ref 15–41)
Albumin: 2.3 g/dL — ABNORMAL LOW (ref 3.5–5.0)
Alkaline Phosphatase: 57 U/L (ref 38–126)
Anion gap: 10 (ref 5–15)
BUN: 22 mg/dL (ref 8–23)
CO2: 21 mmol/L — ABNORMAL LOW (ref 22–32)
Calcium: 8 mg/dL — ABNORMAL LOW (ref 8.9–10.3)
Chloride: 102 mmol/L (ref 98–111)
Creatinine, Ser: 0.95 mg/dL (ref 0.44–1.00)
GFR, Estimated: 59 mL/min — ABNORMAL LOW (ref >60)
Glucose, Bld: 149 mg/dL — ABNORMAL HIGH (ref 70–99)
Potassium: 4.6 mmol/L (ref 3.5–5.1)
Sodium: 133 mmol/L — ABNORMAL LOW (ref 135–145)
Total Bilirubin: 0.8 mg/dL (ref 0.3–1.2)
Total Protein: 5.7 g/dL — ABNORMAL LOW (ref 6.5–8.1)

## 2020-08-31 LAB — CBC
HCT: 36.5 % (ref 36.0–46.0)
Hemoglobin: 11.5 g/dL — ABNORMAL LOW (ref 12.0–15.0)
MCH: 30.3 pg (ref 26.0–34.0)
MCHC: 31.5 g/dL (ref 30.0–36.0)
MCV: 96.1 fL (ref 80.0–100.0)
Platelets: 188 10*3/uL (ref 150–400)
RBC: 3.8 MIL/uL — ABNORMAL LOW (ref 3.87–5.11)
RDW: 15 % (ref 11.5–15.5)
WBC: 13.4 10*3/uL — ABNORMAL HIGH (ref 4.0–10.5)
nRBC: 0 % (ref 0.0–0.2)

## 2020-08-31 LAB — CBG MONITORING, ED
Glucose-Capillary: 145 mg/dL — ABNORMAL HIGH (ref 70–99)
Glucose-Capillary: 158 mg/dL — ABNORMAL HIGH (ref 70–99)
Glucose-Capillary: 142 mg/dL — ABNORMAL HIGH (ref 70–99)

## 2020-08-31 LAB — LACTIC ACID, PLASMA: Lactic Acid, Venous: 2.1 mmol/L (ref 0.5–1.9)

## 2020-08-31 LAB — PROCALCITONIN: Procalcitonin: 0.68 ng/mL

## 2020-08-31 LAB — MRSA PCR SCREENING: MRSA by PCR: NEGATIVE

## 2020-08-31 LAB — OSMOLALITY: Osmolality: 289 mOsm/kg (ref 275–295)

## 2020-08-31 LAB — PROTIME-INR
INR: 1.4 — ABNORMAL HIGH (ref 0.8–1.2)
Prothrombin Time: 16.5 seconds — ABNORMAL HIGH (ref 11.4–15.2)

## 2020-08-31 LAB — TSH: TSH: 2.479 u[IU]/mL (ref 0.350–4.500)

## 2020-08-31 MED ORDER — PANTOPRAZOLE SODIUM 40 MG PO TBEC
40.0000 mg | DELAYED_RELEASE_TABLET | Freq: Every day | ORAL | Status: DC
Start: 2020-08-31 — End: 2020-09-17
  Administered 2020-09-01 – 2020-09-17 (×17): 40 mg via ORAL
  Filled 2020-08-31 (×17): qty 1

## 2020-08-31 MED ORDER — LATANOPROST 0.005 % OP SOLN
1.0000 [drp] | Freq: Every day | OPHTHALMIC | Status: DC
  Administered 2020-08-31 – 2020-09-16 (×17): 1 [drp] via OPHTHALMIC
  Filled 2020-08-31 (×2): qty 2.5

## 2020-08-31 MED ORDER — ASPIRIN 325 MG PO TABS
325.0000 mg | ORAL_TABLET | Freq: Every day | ORAL | Status: DC
  Administered 2020-08-31 – 2020-09-03 (×4): 325 mg via ORAL
  Filled 2020-08-31 (×4): qty 1

## 2020-08-31 MED ORDER — DONEPEZIL HCL 10 MG PO TABS
10.0000 mg | ORAL_TABLET | Freq: Every day | ORAL | Status: DC
  Administered 2020-08-31 – 2020-09-16 (×17): 10 mg via ORAL
  Filled 2020-08-31 (×17): qty 1

## 2020-08-31 MED ORDER — METRONIDAZOLE 500 MG PO TABS
500.0000 mg | ORAL_TABLET | Freq: Three times a day (TID) | ORAL | Status: DC
  Administered 2020-08-31 – 2020-09-06 (×19): 500 mg via ORAL
  Filled 2020-08-31 (×19): qty 1

## 2020-08-31 NOTE — Hospital Course (Addendum)
Ashlee Greer is a 86 y.o. female with medical history significant of A. fib not on anticoagulation, anxiety/depression, arthritis, diabetes, glaucoma, hypertension, hypothyroidism who presented with recurrent loss of consciousness secondary to orthostatic hypotension.  Patient was recently discharged from [facility] after treatment for TIA, UTI, pneumonia.  She also had some fatigue and orthostasis while she was there.  She was discharged on Bactrim for UTI and Levaquin for her pneumonia.  She was recommended to go to an SNF at discharge but family is an Therapist, sports and stated they would care for patient at home.  However, her BP remained low prior to discharge which concerned family.  Due to ongoing ortho hypoTN on returning home, family brought patient to the hospital.    Workup in the ER was notable for temp 101.4, tachypnea, tachycardia. CXR showed left basilar infiltrate vs atelectasis.  Lactic was elevated and she was started on IVF and cefepime. Covid and flu swab negative.  She continued to improve with antibiotics. She was evaluated by physical therapy and recommended for SNF rehab at discharge.  This was discussed with her family who was also in agreement.

## 2020-08-31 NOTE — Progress Notes (Signed)
Declined PT this afternoon and agreed to work tomorrow.  08/31/20 1700  PT Visit Information  Reason Eval/Treat Not Completed Fatigue/lethargy limiting ability to participate    Mee Hives, PT MS Acute Rehab Dept. Number: [place]

## 2020-08-31 NOTE — ED Notes (Signed)
Lunch Tray Ordered @ 1046. 

## 2020-08-31 NOTE — Assessment & Plan Note (Signed)
-  See orthostatic hypotension -Clonazepam dose significantly reduced and holding nortriptyline -Celexa on hold but will try to resume soon

## 2020-08-31 NOTE — Assessment & Plan Note (Signed)
Continue Synthroid °

## 2020-08-31 NOTE — ED Notes (Signed)
phlebotomy at bedside unable to collect Lactic acid. Sepsis RN informed

## 2020-08-31 NOTE — ED Notes (Signed)
CBG 158  

## 2020-08-31 NOTE — ED Notes (Signed)
Help get patient reposition in the bed patient is resting with call bell in reach and family at bedside

## 2020-08-31 NOTE — Assessment & Plan Note (Signed)
-   Continue SSI and CBG monitoring ?

## 2020-08-31 NOTE — Assessment & Plan Note (Addendum)
-  Also possibly multifactorial in setting of infection as well as polypharmacy.  Her medications reviewed and she is on sedating medications including clonazepam as well as anticholinergic mechanisms with nortriptyline which will be held; I have also counseled her bedside that her clonazepam dose needs to be reduced at discharge and ideally would benefit from weaning off of it altogether -resume celexa at lower dose, 20 mg daily on 12/25 -Continue monitoring blood pressure which is improving -Repeat orthostatic blood pressure: negative on 12/24

## 2020-08-31 NOTE — Sepsis Progress Note (Signed)
Sepsis monitoring Complete

## 2020-08-31 NOTE — Assessment & Plan Note (Addendum)
-  Discussed bedside with patient; Eliquis made her confused in the past.  Denies any significant bleeding events.  She is open to alternative anticoagulation -Discussed that we would trial Xarelto (started 12/27) -Continue Coreg

## 2020-08-31 NOTE — Progress Notes (Signed)
   08/31/20 1901  Assess: MEWS Score  Temp 98.3 F (36.8 C)  BP 138/69  Pulse Rate (!) 102  Resp (!) 24  Level of Consciousness Alert  SpO2 98 %  O2 Device Nasal Cannula  O2 Flow Rate (L/min) 4 L/min  Assess: MEWS Score  MEWS Temp 0  MEWS Systolic 0  MEWS Pulse 1  MEWS RR 1  MEWS LOC 0  MEWS Score 2  MEWS Score Color Yellow  Assess: if the MEWS score is Yellow or Red  Were vital signs taken at a resting state? Yes  Focused Assessment No change from prior assessment  Early Detection of Sepsis Score *See Row Information* High  MEWS guidelines implemented *See Row Information* Yes  Treat  Pain Scale 0-10  Pain Score 0  Escalate  MEWS: Escalate Yellow: discuss with charge nurse/RN and consider discussing with provider and RRT  Notify: Charge Nurse/RN  Name of Charge Nurse/RN Notified Tina  Date Charge Nurse/RN Notified 08/31/20  Time Charge Nurse/RN Notified 1910

## 2020-08-31 NOTE — Progress Notes (Signed)
PROGRESS NOTE    Ashlee Greer   KYH:[id number]  DOB: [birth year]  DOA: 08/30/2020     0  PCP: [name], MD  CC: hypotension at home  Hospital Course: Ashlee Greer is a 86 y.o. female with medical history significant of A. fib not on anticoagulation, anxiety/depression, arthritis, diabetes, glaucoma, hypertension, hypothyroidism who presented with recurrent loss of consciousness secondary to orthostatic hypotension.  Patient was recently discharged from [facility] after treatment for TIA, UTI, pneumonia.  She also had some fatigue and orthostasis while she was there.  She was discharged on Bactrim for UTI and Levaquin for her pneumonia.  She was recommended to go to an SNF at discharge but family is an Therapist, sports and stated they would care for patient at home.  However, her BP remained low prior to discharge which concerned family.  Due to ongoing ortho hypoTN on returning home, family brought patient to the hospital.    Workup in the ER was notable for temp 101.4, tachypnea, tachycardia. CXR showed left basilar infiltrate vs atelectasis.  Lactic was elevated and she was started on IVF and cefepime. Covid and flu swab negative.    Interval History:  Seen in the ER, comfortable and no distress. Mentation slowed but answer all questions easily and appropriately.   Old records reviewed in assessment of this patient  ROS: Constitutional: negative for chills, Respiratory: positive for cough, Cardiovascular: negative for chest pain and Gastrointestinal: negative for abdominal pain  Assessment & Plan: * Severe sepsis ([place]) - source suspected pulm at this time given subtle LLE opacity; possibility of aspiration if syncopizing as well - PCT 0.68, continue trending - continue cefepime; add flagyl given elevated PCT and above - follow up cultures  Orthostatic hypotension -Also possibly multifactorial in setting of infection as well as polypharmacy.  Her medications reviewed and  she is on sedating medications including clonazepam as well as anticholinergic mechanisms with nortriptyline which will be held; I have also counseled her bedside that her clonazepam dose needs to be reduced at discharge and ideally would benefit from weaning off of it altogether -Also continue holding Celexa for now -Continue monitoring blood pressure which is improving -Repeat orthostatic blood pressure in a.m.  History of TIA (transient ischemic attack) -Continue aspirin for now -Obtaining records from [facility] and depression -See orthostatic hypotension -Clonazepam dose significantly reduced and holding nortriptyline -Celexa on hold but will try to resume soon  Unspecified atrial fibrillation ([place]) -Still in A. fib with.  Unclear discussion about anticoagulation.  Obtaining records from [place] for further clarification -Continue Coreg for now  Acute lower UTI -She does endorse some symptoms.  UA not very impressive however (neg nitrite, neg LE, 0-5 WBC, and only rare bacteria) - regardless treatment for PNA will cover urine most likely   Hypothyroidism -Continue Synthroid  Diabetes (HCC) -Continue SSI and CBG monitoring    Antimicrobials: Cefepime 12/22>>current  Flagyl 12/23>>current  DVT prophylaxis: Lovenox Code Status: Partial, Intubate only  Family Communication:  Disposition Plan: Status is: Inpatient  Remains inpatient appropriate because:Ongoing diagnostic testing needed not appropriate for outpatient work up, Unsafe d/c plan, IV treatments appropriate due to intensity of illness or inability to take PO and Inpatient level of care appropriate due to severity of illness   Dispo: The patient is from: Home              Anticipated d/c is to: Home  Anticipated d/c date is: 3 days              Patient currently is not medically stable to d/c.       Objective: Blood pressure (!) 142/81, pulse 95, temperature 98.8 F (37.1 C),  temperature source Oral, resp. rate (!) 25, height 5\' 1"  (1.549 m), weight 88.5 kg, SpO2 97 %.  Examination: General appearance: alert, cooperative and no distress Head: Normocephalic, without obvious abnormality, atraumatic Eyes: EOMI Lungs: mildly coarse BS, no wheezing Heart: irregularly irregular rhythm and S1, S2 normal Abdomen: normal findings: bowel sounds normal Extremities: no edema Skin: mobility and turgor normal Neurologic: Grossly normal  Consultants:   n/a  Procedures:   n/a  Data Reviewed: I have personally reviewed following labs and imaging studies Results for orders placed or performed during the hospital encounter of 08/30/20 (from the past 24 hour(s))  Lactic acid, plasma     Status: Abnormal   Collection Time: 08/30/20  6:33 PM  Result Value Ref Range   Lactic Acid, Venous 2.4 (HH) 0.5 - 1.9 mmol/L  Comprehensive metabolic panel     Status: Abnormal   Collection Time: 08/30/20  6:33 PM  Result Value Ref Range   Sodium 130 (L) 135 - 145 mmol/L   Potassium 4.9 3.5 - 5.1 mmol/L   Chloride 97 (L) 98 - 111 mmol/L   CO2 21 (L) 22 - 32 mmol/L   Glucose, Bld 149 (H) 70 - 99 mg/dL   BUN 30 (H) 8 - 23 mg/dL   Creatinine, Ser 1.25 (H) 0.44 - 1.00 mg/dL   Calcium 8.5 (L) 8.9 - 10.3 mg/dL   Total Protein 6.3 (L) 6.5 - 8.1 g/dL   Albumin 2.7 (L) 3.5 - 5.0 g/dL   AST 94 (H) 15 - 41 U/L   ALT 34 0 - 44 U/L   Alkaline Phosphatase 63 38 - 126 U/L   Total Bilirubin 0.8 0.3 - 1.2 mg/dL   GFR, Estimated 42 (L) >60 mL/min   Anion gap 12 5 - 15  CBC WITH DIFFERENTIAL     Status: Abnormal   Collection Time: 08/30/20  6:33 PM  Result Value Ref Range   WBC 16.9 (H) 4.0 - 10.5 K/uL   RBC 4.09 3.87 - 5.11 MIL/uL   Hemoglobin 12.8 12.0 - 15.0 g/dL   HCT 38.7 36.0 - 46.0 %   MCV 94.6 80.0 - 100.0 fL   MCH 31.3 26.0 - 34.0 pg   MCHC 33.1 30.0 - 36.0 g/dL   RDW 14.9 11.5 - 15.5 %   Platelets 210 150 - 400 K/uL   nRBC 0.0 0.0 - 0.2 %   Neutrophils Relative % 84 %    Neutro Abs 14.2 (H) 1.7 - 7.7 K/uL   Lymphocytes Relative 6 %   Lymphs Abs 1.0 0.7 - 4.0 K/uL   Monocytes Relative 9 %   Monocytes Absolute 1.5 (H) 0.1 - 1.0 K/uL   Eosinophils Relative 0 %   Eosinophils Absolute 0.0 0.0 - 0.5 K/uL   Basophils Relative 0 %   Basophils Absolute 0.0 0.0 - 0.1 K/uL   Immature Granulocytes 1 %   Abs Immature Granulocytes 0.11 (H) 0.00 - 0.07 K/uL  Protime-INR     Status: Abnormal   Collection Time: 08/30/20  6:33 PM  Result Value Ref Range   Prothrombin Time 15.5 (H) 11.4 - 15.2 seconds   INR 1.3 (H) 0.8 - 1.2  APTT     Status: None  Collection Time: 08/30/20  6:33 PM  Result Value Ref Range   aPTT 25 24 - 36 seconds  Blood culture (routine single)     Status: None (Preliminary result)   Collection Time: 08/30/20  6:33 PM   Specimen: BLOOD  Result Value Ref Range   Specimen Description BLOOD BLOOD RIGHT FOREARM    Special Requests      BOTTLES DRAWN AEROBIC ONLY Blood Culture results may not be optimal due to an inadequate volume of blood received in culture bottles   Culture      NO GROWTH < 12 HOURS Performed at [facility] [address] [place], [place] [address]    Report Status PENDING   Urinalysis, Routine w reflex microscopic Urine, Clean Catch     Status: Abnormal   Collection Time: 08/30/20  6:33 PM  Result Value Ref Range   Color, Urine AMBER (A) YELLOW   APPearance HAZY (A) CLEAR   Specific Gravity, Urine 1.027 1.005 - 1.030   pH 5.0 5.0 - 8.0   Glucose, UA 50 (A) NEGATIVE mg/dL   Hgb urine dipstick SMALL (A) NEGATIVE   Bilirubin Urine NEGATIVE NEGATIVE   Ketones, ur NEGATIVE NEGATIVE mg/dL   Protein, ur 100 (A) NEGATIVE mg/dL   Nitrite NEGATIVE NEGATIVE   Leukocytes,Ua NEGATIVE NEGATIVE   RBC / HPF 0-5 0 - 5 RBC/hpf   WBC, UA 0-5 0 - 5 WBC/hpf   Bacteria, UA RARE (A) NONE SEEN   Squamous Epithelial / LPF 0-5 0 - 5   Hyaline Casts, UA PRESENT   Troponin I (High Sensitivity)     Status: Abnormal   Collection Time:  08/30/20  6:33 PM  Result Value Ref Range   Troponin I (High Sensitivity) 20 (H) <18 ng/L  Magnesium     Status: None   Collection Time: 08/30/20  6:33 PM  Result Value Ref Range   Magnesium 2.1 1.7 - 2.4 mg/dL  Resp Panel by RT-PCR (Flu A&B, Covid) Nasopharyngeal Swab     Status: None   Collection Time: 08/30/20  6:33 PM   Specimen: Nasopharyngeal Swab; Nasopharyngeal(NP) swabs in vial transport medium  Result Value Ref Range   SARS Coronavirus 2 by RT PCR NEGATIVE NEGATIVE   Influenza A by PCR NEGATIVE NEGATIVE   Influenza B by PCR NEGATIVE NEGATIVE  Lactic acid, plasma     Status: Abnormal   Collection Time: 08/30/20  7:24 PM  Result Value Ref Range   Lactic Acid, Venous 3.0 (HH) 0.5 - 1.9 mmol/L  Troponin I (High Sensitivity)     Status: Abnormal   Collection Time: 08/30/20  7:24 PM  Result Value Ref Range   Troponin I (High Sensitivity) 22 (H) <18 ng/L  Culture, blood (single)     Status: None (Preliminary result)   Collection Time: 08/30/20  8:25 PM   Specimen: BLOOD RIGHT HAND  Result Value Ref Range   Specimen Description BLOOD RIGHT HAND    Special Requests      BOTTLES DRAWN AEROBIC AND ANAEROBIC Blood Culture results may not be optimal due to an inadequate volume of blood received in culture bottles   Culture      NO GROWTH < 12 HOURS Performed at [facility], [address], [place], [place] [address]    Report Status PENDING   CBG monitoring, ED     Status: Abnormal   Collection Time: 08/31/20  1:04 AM  Result Value Ref Range   Glucose-Capillary 145 (H)  70 - 99 mg/dL  TSH     Status: None   Collection Time: 08/31/20  1:07 AM  Result Value Ref Range   TSH 2.479 0.350 - 4.500 uIU/mL  MRSA PCR Screening     Status: None   Collection Time: 08/31/20  1:07 AM   Specimen: Nasal Mucosa; Nasopharyngeal  Result Value Ref Range   MRSA by PCR NEGATIVE NEGATIVE  Lactic acid, plasma     Status: Abnormal   Collection Time: 08/31/20  1:07 AM  Result Value Ref  Range   Lactic Acid, Venous 2.1 (HH) 0.5 - 1.9 mmol/L  Osmolality     Status: None   Collection Time: 08/31/20  1:07 AM  Result Value Ref Range   Osmolality 289 275 - 295 mOsm/kg  Protime-INR     Status: Abnormal   Collection Time: 08/31/20  6:03 AM  Result Value Ref Range   Prothrombin Time 16.5 (H) 11.4 - 15.2 seconds   INR 1.4 (H) 0.8 - 1.2  Procalcitonin     Status: None   Collection Time: 08/31/20  6:03 AM  Result Value Ref Range   Procalcitonin 0.68 ng/mL  Comprehensive metabolic panel     Status: Abnormal   Collection Time: 08/31/20  6:03 AM  Result Value Ref Range   Sodium 133 (L) 135 - 145 mmol/L   Potassium 4.6 3.5 - 5.1 mmol/L   Chloride 102 98 - 111 mmol/L   CO2 21 (L) 22 - 32 mmol/L   Glucose, Bld 149 (H) 70 - 99 mg/dL   BUN 22 8 - 23 mg/dL   Creatinine, Ser 0.95 0.44 - 1.00 mg/dL   Calcium 8.0 (L) 8.9 - 10.3 mg/dL   Total Protein 5.7 (L) 6.5 - 8.1 g/dL   Albumin 2.3 (L) 3.5 - 5.0 g/dL   AST 85 (H) 15 - 41 U/L   ALT 33 0 - 44 U/L   Alkaline Phosphatase 57 38 - 126 U/L   Total Bilirubin 0.8 0.3 - 1.2 mg/dL   GFR, Estimated 59 (L) >60 mL/min   Anion gap 10 5 - 15  CBC     Status: Abnormal   Collection Time: 08/31/20  6:03 AM  Result Value Ref Range   WBC 13.4 (H) 4.0 - 10.5 K/uL   RBC 3.80 (L) 3.87 - 5.11 MIL/uL   Hemoglobin 11.5 (L) 12.0 - 15.0 g/dL   HCT 36.5 36.0 - 46.0 %   MCV 96.1 80.0 - 100.0 fL   MCH 30.3 26.0 - 34.0 pg   MCHC 31.5 30.0 - 36.0 g/dL   RDW 15.0 11.5 - 15.5 %   Platelets 188 150 - 400 K/uL   nRBC 0.0 0.0 - 0.2 %  CBG monitoring, ED     Status: Abnormal   Collection Time: 08/31/20  7:51 AM  Result Value Ref Range   Glucose-Capillary 158 (H) 70 - 99 mg/dL    Recent Results (from the past 240 hour(s))  Blood culture (routine single)     Status: None (Preliminary result)   Collection Time: 08/30/20  6:33 PM   Specimen: BLOOD  Result Value Ref Range Status   Specimen Description BLOOD BLOOD RIGHT FOREARM  Final   Special Requests    Final    BOTTLES DRAWN AEROBIC ONLY Blood Culture results may not be optimal due to an inadequate volume of blood received in culture bottles   Culture   Final    NO GROWTH < 12 HOURS Performed at [facility], [address] [place], [place] [address]    Report Status PENDING  Incomplete  Resp Panel by RT-PCR (Flu A&B, Covid) Nasopharyngeal Swab     Status: None   Collection Time: 08/30/20  6:33 PM   Specimen: Nasopharyngeal Swab; Nasopharyngeal(NP) swabs in vial transport medium  Result Value Ref Range Status   SARS Coronavirus 2 by RT PCR NEGATIVE NEGATIVE Final    Comment: (NOTE) SARS-CoV-2 target nucleic acids are NOT DETECTED.  The SARS-CoV-2 RNA is generally detectable in upper respiratory specimens during the acute phase of infection. The lowest concentration of SARS-CoV-2 viral copies this assay can detect is 138 copies/mL. A negative result does not preclude SARS-Cov-2 infection and should not be used as the sole basis for treatment or other patient management decisions. A negative result may occur with  improper specimen collection/handling, submission of specimen other than nasopharyngeal swab, presence of viral mutation(s) within the areas targeted by this assay, and inadequate number of viral copies(<138 copies/mL). A negative result must be combined with clinical observations, patient history, and epidemiological information. The expected result is Negative.  Fact Sheet for Patients:  EntrepreneurPulse.com.au  Fact Sheet for Healthcare Providers:  IncredibleEmployment.be  This test is no t yet approved or cleared by the [place] FDA and  has been authorized for detection and/or diagnosis of SARS-CoV-2 by FDA under an Emergency Use Authorization (EUA). This EUA will remain  in effect (meaning this test can be used) for the duration of the COVID-19 declaration under Section 564(b)(1) of the Act, 21 U.S.C.section  360bbb-3(b)(1), unless the authorization is terminated  or revoked sooner.       Influenza A by PCR NEGATIVE NEGATIVE Final   Influenza B by PCR NEGATIVE NEGATIVE Final    Comment: (NOTE) The Xpert Xpress SARS-CoV-2/FLU/RSV plus assay is intended as an aid in the diagnosis of influenza from Nasopharyngeal swab specimens and should not be used as a sole basis for treatment. Nasal washings and aspirates are unacceptable for Xpert Xpress SARS-CoV-2/FLU/RSV testing.  Fact Sheet for Patients: EntrepreneurPulse.com.au  Fact Sheet for Healthcare Providers: IncredibleEmployment.be  This test is not yet approved or cleared by the [place] FDA and has been authorized for detection and/or diagnosis of SARS-CoV-2 by FDA under an Emergency Use Authorization (EUA). This EUA will remain in effect (meaning this test can be used) for the duration of the COVID-19 declaration under Section 564(b)(1) of the Act, 21 U.S.C. section 360bbb-3(b)(1), unless the authorization is terminated or revoked.  Performed at [facility], [place] [address] [place], [place] [address]   Culture, blood (single)     Status: None (Preliminary result)   Collection Time: 08/30/20  8:25 PM   Specimen: BLOOD RIGHT HAND  Result Value Ref Range Status   Specimen Description BLOOD RIGHT HAND  Final   Special Requests   Final    BOTTLES DRAWN AEROBIC AND ANAEROBIC Blood Culture results may not be optimal due to an inadequate volume of blood received in culture bottles   Culture   Final    NO GROWTH < 12 HOURS Performed at [facility], [place] [address], [place],  [address]    Report Status PENDING  Incomplete  MRSA PCR Screening     Status: None   Collection Time: 08/31/20  1:07 AM   Specimen: Nasal Mucosa; Nasopharyngeal  Result Value Ref Range Status   MRSA by PCR NEGATIVE NEGATIVE Final    Comment:  The GeneXpert MRSA Assay (FDA approved for NASAL  specimens only), is one component of a comprehensive MRSA colonization surveillance program. It is not intended to diagnose MRSA infection nor to guide or monitor treatment for MRSA infections. Performed at [facility], [place] [address], [place], [place] [address]      Radiology Studies: DG Chest Port 1 View  Result Date: 08/30/2020 CLINICAL DATA:  Sepsis. EXAM: PORTABLE CHEST 1 VIEW COMPARISON:  08/28/2020 FINDINGS: Cardiomegaly remains stable. Mild infiltrate or atelectasis in the left lung base appears increased since previous study. Linear opacity at the right lung base is stable and may be due to subsegmental atelectasis or scarring. IMPRESSION: Increased mild left basilar infiltrate or atelectasis. Stable right basilar atelectasis versus scarring. Stable cardiomegaly. Electronically Signed   By: Marlaine Hind M.D.   On: 08/30/2020 18:27   DG Chest Port 1 View  Final Result      Scheduled Meds: . aspirin  325 mg Oral QHS  . carvedilol  3.125 mg Oral BID WC  . clonazePAM  0.5 mg Oral QHS  . donepezil  10 mg Oral QHS  . enoxaparin (LOVENOX) injection  40 mg Subcutaneous Q24H  . insulin aspart  0-15 Units Subcutaneous TID WC  . latanoprost  1 drop Both Eyes QHS  . levothyroxine  50 mcg Oral QAC breakfast  . metroNIDAZOLE  500 mg Oral Q8H  . pantoprazole  40 mg Oral Daily  . sodium chloride flush  3 mL Intravenous Q12H   PRN Meds: acetaminophen **OR** acetaminophen, polyethylene glycol Continuous Infusions: . ceFEPime (MAXIPIME) IV Stopped (08/31/20 1117)     LOS: 0 days  Time spent: Greater than 50% of the 35 minute visit was spent in counseling/coordination of care for the patient as laid out in the A&P.   Dwyane Dee, MD [facility] 08/31/2020, 4:13 PM

## 2020-08-31 NOTE — Assessment & Plan Note (Signed)
-  She does endorse some symptoms.  UA not very impressive however (neg nitrite, neg LE, 0-5 WBC, and only rare bacteria) - regardless treatment for PNA will cover urine most likely

## 2020-08-31 NOTE — Assessment & Plan Note (Addendum)
-   see afib - never received records from [facility]

## 2020-08-31 NOTE — ED Notes (Signed)
Repeat lactic collected at this time

## 2020-08-31 NOTE — Assessment & Plan Note (Addendum)
-   source suspected pulm at this time given subtle LLE opacity; possibility of aspiration if syncopizing as well - PCT 0.68>>0.79>>0.5 - now clinically improved on IV abx; okay to change to Azithro (continue flagyl) and will complete course (d/c cefepime) - follow up cultures: negative to date - finishing out 7 day course total

## 2020-09-01 DIAGNOSIS — R6520 Severe sepsis without septic shock: Secondary | ICD-10-CM | POA: Diagnosis not present

## 2020-09-01 DIAGNOSIS — A419 Sepsis, unspecified organism: Secondary | ICD-10-CM | POA: Diagnosis not present

## 2020-09-01 LAB — RESPIRATORY PANEL BY PCR

## 2020-09-01 LAB — BASIC METABOLIC PANEL
Anion gap: 7 (ref 5–15)
BUN: 16 mg/dL (ref 8–23)
CO2: 24 mmol/L (ref 22–32)
Calcium: 8.4 mg/dL — ABNORMAL LOW (ref 8.9–10.3)
Chloride: 102 mmol/L (ref 98–111)
Creatinine, Ser: 0.94 mg/dL (ref 0.44–1.00)
GFR, Estimated: 59 mL/min — ABNORMAL LOW (ref >60)
Glucose, Bld: 127 mg/dL — ABNORMAL HIGH (ref 70–99)
Potassium: 4.1 mmol/L (ref 3.5–5.1)
Sodium: 133 mmol/L — ABNORMAL LOW (ref 135–145)

## 2020-09-01 LAB — CBC WITH DIFFERENTIAL/PLATELET
Abs Immature Granulocytes: 0.11 10*3/uL — ABNORMAL HIGH (ref 0.00–0.07)
Basophils Absolute: 0 10*3/uL (ref 0.0–0.1)
Basophils Relative: 0 %
Eosinophils Absolute: 0.1 10*3/uL (ref 0.0–0.5)
Eosinophils Relative: 1 %
HCT: 32.8 % — ABNORMAL LOW (ref 36.0–46.0)
Hemoglobin: 10.4 g/dL — ABNORMAL LOW (ref 12.0–15.0)
Immature Granulocytes: 1 %
Lymphocytes Relative: 13 %
Lymphs Abs: 1.6 10*3/uL (ref 0.7–4.0)
MCH: 29.8 pg (ref 26.0–34.0)
MCHC: 31.7 g/dL (ref 30.0–36.0)
MCV: 94 fL (ref 80.0–100.0)
Monocytes Absolute: 1 10*3/uL (ref 0.1–1.0)
Monocytes Relative: 8 %
Neutro Abs: 9.1 10*3/uL — ABNORMAL HIGH (ref 1.7–7.7)
Neutrophils Relative %: 77 %
Platelets: 192 10*3/uL (ref 150–400)
RBC: 3.49 MIL/uL — ABNORMAL LOW (ref 3.87–5.11)
RDW: 14.8 % (ref 11.5–15.5)
WBC: 11.8 10*3/uL — ABNORMAL HIGH (ref 4.0–10.5)
nRBC: 0 % (ref 0.0–0.2)

## 2020-09-01 LAB — PROCALCITONIN: Procalcitonin: 0.79 ng/mL

## 2020-09-01 LAB — GLUCOSE, CAPILLARY
Glucose-Capillary: 163 mg/dL — ABNORMAL HIGH (ref 70–99)
Glucose-Capillary: 148 mg/dL — ABNORMAL HIGH (ref 70–99)
Glucose-Capillary: 249 mg/dL — ABNORMAL HIGH (ref 70–99)
Glucose-Capillary: 149 mg/dL — ABNORMAL HIGH (ref 70–99)
Glucose-Capillary: 117 mg/dL — ABNORMAL HIGH (ref 70–99)

## 2020-09-01 LAB — MAGNESIUM: Magnesium: 1.9 mg/dL (ref 1.7–2.4)

## 2020-09-01 NOTE — TOC Initial Note (Addendum)
Transition of Care [facility]) - Initial/Assessment Note    Patient Details  Name: Ashlee Greer MRN: [id number] Date of Birth: March 13, 1933  Transition of [facility] CM/SW Contact:    Verdell Carmine, RN Phone Number: 09/01/2020, 9:20 AM  Clinical Narrative:                 Patient just at [facility] with UTI and Pneumonia presents here with AMS, fever. Sepsis, lactic acid elevation. Lives with family who is Therapist, sports. DO not want SNF will care for patient at home. MD have decreased sedative medications to have patient be more alert and decrease orthostasis. Patient currently not receiving home health services.  CM will follow for needs.  1000 called daughter and spoke to her regarding SNF vs home with home health. She will discuss with family and call this RNCM back.  With decision. In the meantime, PT will evaluate and make recommendations.  Daughter sates that patient only received one dose of COVID immunization, she reacted strongly to it and was sickly feeling. She is willing for her to have a second dose when MD states it ok for her to have it.    Expected Discharge Plan: [place] Barriers to Discharge: Continued Medical Work up   Patient Goals and CMS Choice        Expected Discharge Plan and Services Expected Discharge Plan: [place]   Discharge Planning Services: CM Consult Post Acute Care Choice: Durable Medical Equipment Living arrangements for the past 2 months: Single Family Home                                      Prior Living Arrangements/Services Living arrangements for the past 2 months: Single Family Home Lives with:: Adult Children,Relatives Patient language and need for interpreter reviewed:: Yes        Need for Family Participation in Patient Care: Yes (Comment) Care giver support system in place?: Yes (comment) Current home services: DME Criminal Activity/Legal Involvement Pertinent to Current  Situation/Hospitalization: No - Comment as needed  Activities of Daily Living Home Assistive Devices/Equipment: Walker (specify type) ADL Screening (condition at time of admission) Patient's cognitive ability adequate to safely complete daily activities?: No Is the patient deaf or have difficulty hearing?: Yes Does the patient have difficulty seeing, even when wearing glasses/contacts?: Yes Does the patient have difficulty concentrating, remembering, or making decisions?: Yes Patient able to express need for assistance with ADLs?: Yes Does the patient have difficulty dressing or bathing?: Yes Independently performs ADLs?: No Communication: Independent Does the patient have difficulty walking or climbing stairs?: Yes Weakness of Legs: Both Weakness of Arms/Hands: Both  Permission Sought/Granted                  Emotional Assessment       Orientation: : Fluctuating Orientation (Suspected and/or reported Sundowners) Alcohol / Substance Use: Not Applicable Psych Involvement: No (comment)  Admission diagnosis:  Weakness [R53.1] Severe sepsis ([place]) [A41.9, R65.20] Hypotension, unspecified hypotension type [I95.9] Sepsis, due to unspecified organism, unspecified whether acute organ dysfunction present [facility]) [A41.9] Patient Active Problem List   Diagnosis Date Noted  . Severe sepsis ([place]) 08/30/2020  . Acute lower UTI 08/30/2020  . Pneumonia 08/30/2020  . Orthostatic hypotension 08/30/2020  . History of TIA (transient ischemic attack) 08/30/2020  . Unspecified atrial fibrillation ([place]) 08/30/2020  . Anxiety and depression  08/30/2020  . Depression 08/30/2020  . Diabetes ([place]) 08/30/2020  . Hypothyroidism 08/30/2020   PCP:  [name], MD Pharmacy:   [facility] [place] - [address] [place] [address] Phone: [phone] Fax: [phone]     Social Determinants of Health (SDOH) Interventions    Readmission  Risk Interventions No flowsheet data found.

## 2020-09-01 NOTE — Evaluation (Signed)
Physical Therapy Evaluation Patient Details Name: Ashlee Greer MRN: [id number] DOB: 06-11-1933 Today's Date: 09/01/2020   History of Present Illness  Ashlee Greer is a 86 y.o. female with medical history significant of A. fib not on anticoagulation, anxiety/depression, arthritis, diabetes, glaucoma, hypertension, hypothyroidism who presents with recurrent loss of consciousness secondary to orthostatic hypotension. Patient was recently discharged from [facility] after treatment for TIA, UTI, pneumonia.  She also had some fatigue and orthostasis while she was there. She was recommended to go to an SNF at discharge but family is an Therapist, sports and thought they could handle her at home.  Family states that however protested her discharged and got an extra day because of this prior to discharge.  However, after returning home, her blood pressures consistently dropped on standing and has gotten so bad that she had episodes of syncope while working with PT.  Clinical Impression  PTA, patient was requiring heavy assistance for mobility over the past week. Patient demos R side neglect with heavy L lateral lean, cues required for attending to R side. Patient requires heavy assistance for all mobility with poor sitting and standing balance. Attempted orthostatics,however unable to get standing BP (see flowsheet for values). Patient presents with deficits in mobility, endurance, strength, and balance. Patient will benefit from skilled PT services to address listed deficits. Recommend SNF following discharge to maximize functional mobility.     Follow Up Recommendations SNF;Supervision/Assistance - 24 hour    Equipment Recommendations   (defer to post acute Greer)    Recommendations for Other Services       Precautions / Restrictions Precautions Precautions: Fall Restrictions Weight Bearing Restrictions: No      Mobility  Bed Mobility Overal bed mobility: Needs Assistance Bed Mobility: Supine to  Sit;Sit to Supine;Rolling Rolling: Max assist;+2 for physical assistance;+2 for safety/equipment   Supine to sit: Max assist;+2 for physical assistance;+2 for safety/equipment;HOB elevated Sit to supine: Total assist;+2 for physical assistance;+2 for safety/equipment   General bed mobility comments: patient able to initiate B LE movement and rolling. Overall maxA-totalA+2 for bed mobility    Transfers Overall transfer level: Needs assistance Equipment used: Rolling Ashlee Greer (2 wheeled) Transfers: Sit to/from Stand Sit to Stand: Max assist;+2 physical assistance;+2 safety/equipment;From elevated surface         General transfer comment: patient able to initiate but unable to complete without heavy assistance. Patient able to stand ~1 minute to assess orthostatic vitals however unable to get standing BP  Ambulation/Gait                Stairs            Wheelchair Mobility    Modified Rankin (Stroke Patients Only)       Balance Overall balance assessment: Needs assistance Sitting-balance support: Single extremity supported;Feet supported Sitting balance-Leahy Scale: Poor Sitting balance - Comments: required modA to maintain sitting EOB with heavy L lateral lean Postural control: Left lateral lean Standing balance support: Bilateral upper extremity supported Standing balance-Leahy Scale: Zero Standing balance comment: maxA+2 to maintain standing to get standing orthostatic vitals                             Pertinent Vitals/Pain Pain Assessment: Faces Faces Pain Scale: Hurts a little bit Pain Location: unable to locate Pain Descriptors / Indicators: Grimacing Pain Intervention(s): Limited activity within patient's tolerance;Monitored during session    Home Living Family/patient expects to be discharged to:: Skilled  nursing facility                 Additional Comments: family agreeable to SNF recommendation    Prior Function Level of  Independence: Needs assistance   Gait / Transfers Assistance Needed: +2 assist for mobility in the past week  ADL's / Homemaking Assistance Needed: requires assistance        Hand Dominance        Extremity/Trunk Assessment   Upper Extremity Assessment Upper Extremity Assessment: Generalized weakness    Lower Extremity Assessment Lower Extremity Assessment: Generalized weakness       Communication   Communication: No difficulties  Cognition Arousal/Alertness: Awake/alert Behavior During Therapy: WFL for tasks assessed/performed Overall Cognitive Status: Impaired/Different from baseline                                 General Comments: beginning of session, patient A&Ox4, however at end of session patient pleasantly confused and asks PT "Did you buy this house" and when asked where are we, she states "Ashlee Greer". Notified RN of confusion. Daughter states she has been like that throughout the day      General Comments General comments (skin integrity, edema, etc.): Patient with R side neglect and tendency to lean heavily to L, cues required to attend to R side. Encouraged use of R hand for feeding and have discussions on the R    Exercises     Assessment/Plan    PT Assessment Patient needs continued PT services  PT Problem List Decreased strength;Decreased balance;Decreased activity tolerance;Decreased mobility       PT Treatment Interventions DME instruction;Gait training;Functional mobility training;Therapeutic activities;Therapeutic exercise;Balance training;Patient/family education;Wheelchair mobility training    PT Goals (Current goals can be found in the Care Plan section)  Acute Greer PT Goals Patient Stated Goal: to get stronger PT Goal Formulation: With patient/family Time For Goal Achievement: 09/15/20 Potential to Achieve Goals: Fair    Frequency Min 2X/week   Barriers to discharge        Co-evaluation                AM-PAC PT "6 Clicks" Mobility  Outcome Measure Help needed turning from your back to your side while in a flat bed without using bedrails?: A Lot Help needed moving from lying on your back to sitting on the side of a flat bed without using bedrails?: A Lot Help needed moving to and from a bed to a chair (including a wheelchair)?: Total Help needed standing up from a chair using your arms (e.g., wheelchair or bedside chair)?: A Lot Help needed to walk in hospital room?: Total Help needed climbing 3-5 steps with a railing? : Total 6 Click Score: 9    End of Session Equipment Utilized During Treatment: Gait belt Activity Tolerance: Patient limited by fatigue Patient left: in bed;with call bell/phone within reach;with bed alarm set Nurse Communication: Mobility status PT Visit Diagnosis: Unsteadiness on feet (R26.81);Other abnormalities of gait and mobility (R26.89);Muscle weakness (generalized) (M62.81);Difficulty in walking, not elsewhere classified (R26.2)    Time: 7793-9030 PT Time Calculation (min) (ACUTE ONLY): 47 min   Charges:   PT Evaluation $PT Eval Moderate Complexity: 1 Mod PT Treatments $Therapeutic Activity: 23-37 mins        Perrin Maltese, PT, DPT Acute Rehabilitation Services Pager [phone] Office [phone]   Melene Plan Bartelson 09/01/2020, 12:55 PM

## 2020-09-01 NOTE — Progress Notes (Signed)
Pharmacy Antibiotic Note  Ashlee Greer is a 86 y.o. female admitted on 08/30/2020 with sepsis thought to be from UTI and PNA.  Pharmacy has been consulted for cefepime dosing; also on PO Flagyl. Today is day 2 of therapy.  Patient is afebrile, WBC trending down, and renal function is stable.  Plan: -Continue cefepime 2 g IV q12h -Monitor CBC, renal fx, cultures and clinical progress  Height: 5\' 1"  (154.9 cm) Weight: 88.5 kg (195 lb) IBW/kg (Calculated) : 47.8  Temp (24hrs), Avg:98.8 F (37.1 C), Min:98.3 F (36.8 C), Max:99.9 F (37.7 C)  Recent Labs  Lab 08/30/20 1833 08/30/20 1924 08/31/20 0107 08/31/20 0603 09/01/20 0145  WBC 16.9*  --   --  13.4* 11.8*  CREATININE 1.25*  --   --  0.95 0.94  LATICACIDVEN 2.4* 3.0* 2.1*  --   --     Estimated Creatinine Clearance: 44.3 mL/min (by C-G formula based on SCr of 0.94 mg/dL).    Allergies  Allergen Reactions  . Nisoldipine Shortness Of Breath, Swelling and Other (See Comments)    Possibly made the body swell  . Penicillin G Shortness Of Breath, Swelling and Other (See Comments)    Body swells  . Ace Inhibitors Other (See Comments)    HYPERKALEMIA  . Eliquis [Apixaban] Other (See Comments)    Altered mental status     Antimicrobials this admission: Cefepime 12/22 >>  Flagyl 12/23 >>  Dose adjustments this admission:  Microbiology results: 12/22 BCx: ngtd 12/22 UCx:   12/23 MRSA PCR neg   Thank you for involving pharmacy in this patient's care.  Renold Genta, PharmD, BCPS Clinical Pharmacist Clinical phone for 09/01/2020 until 3p is [phone] 09/01/2020 10:51 AM  **Pharmacist phone directory can be found on [email or web address] listed under [place]**

## 2020-09-01 NOTE — Progress Notes (Signed)
PROGRESS NOTE    HANNAN TETZLAFF   AOZ:[id number]  DOB: [birth year]  DOA: 08/30/2020     1  PCP: [name], MD  CC: hypotension at home  Hospital Course: Ashlee Greer is a 86 y.o. female with medical history significant of A. fib not on anticoagulation, anxiety/depression, arthritis, diabetes, glaucoma, hypertension, hypothyroidism who presented with recurrent loss of consciousness secondary to orthostatic hypotension.  Patient was recently discharged from [facility] after treatment for TIA, UTI, pneumonia.  She also had some fatigue and orthostasis while she was there.  She was discharged on Bactrim for UTI and Levaquin for her pneumonia.  She was recommended to go to an SNF at discharge but family is an Therapist, sports and stated they would care for patient at home.  However, her BP remained low prior to discharge which concerned family.  Due to ongoing ortho hypoTN on returning home, family brought patient to the hospital.    Workup in the ER was notable for temp 101.4, tachypnea, tachycardia. CXR showed left basilar infiltrate vs atelectasis.  Lactic was elevated and she was started on IVF and cefepime. Covid and flu swab negative.    Interval History:  No events overnight. Complaining of L knee pain. Says she's been bedbound for about 1 month. Doesn't seem enthusiastic about getting up with PT and says she's been told this often.  They declined rehab from [facility] at discharge and her daughter is a retired Marine scientist so they chose to take her home.  Patient was too tired to work with PT yesterday.   Old records reviewed in assessment of this patient  ROS: Constitutional: negative for chills, Respiratory: positive for cough, Cardiovascular: negative for chest pain and Gastrointestinal: negative for abdominal pain  Assessment & Plan: * Severe sepsis ([place]) - source suspected pulm at this time given subtle LLE opacity; possibility of aspiration if syncopizing as well - PCT 0.68,  continue trending - continue cefepime; add flagyl given elevated PCT and above - follow up cultures  Orthostatic hypotension -Also possibly multifactorial in setting of infection as well as polypharmacy.  Her medications reviewed and she is on sedating medications including clonazepam as well as anticholinergic mechanisms with nortriptyline which will be held; I have also counseled her bedside that her clonazepam dose needs to be reduced at discharge and ideally would benefit from weaning off of it altogether -Also continue holding Celexa for now -Continue monitoring blood pressure which is improving -Repeat orthostatic blood pressure  History of TIA (transient ischemic attack) -Continue aspirin for now -Obtaining [facility] and depression -See orthostatic hypotension -Clonazepam dose significantly reduced and holding nortriptyline -Celexa on hold but will try to resume soon  Unspecified atrial fibrillation ([place]) -Still in A. fib with.  Unclear discussion about anticoagulation.  Obtaining records from [place] [facility] for further clarification -Continue Coreg for now  Acute lower UTI -She does endorse some symptoms.  UA not very impressive however (neg nitrite, neg LE, 0-5 WBC, and only rare bacteria) - regardless treatment for PNA will cover urine most likely   Hypothyroidism -Continue Synthroid  Diabetes (HCC) -Continue SSI and CBG monitoring   Antimicrobials: Cefepime 12/22>>current  Flagyl 12/23>>current  DVT prophylaxis: Lovenox Code Status: Partial, Intubate only  Family Communication:  Disposition Plan: Status is: Inpatient  Remains inpatient appropriate because:Ongoing diagnostic testing needed not appropriate for outpatient work up, Unsafe d/c plan, IV treatments appropriate due to intensity of illness or inability to take PO and Inpatient level of care  appropriate due to severity of illness   Dispo: The patient is from: Home               Anticipated d/c is to: Home              Anticipated d/c date is: 1 day              Patient currently is not medically stable to d/c.  Objective: Blood pressure 132/70, pulse 92, temperature 98.3 F (36.8 C), resp. rate 20, height 5\' 1"  (1.549 m), weight 88.5 kg, SpO2 99 %.  Examination: General appearance: alert, cooperative and no distress Head: Normocephalic, without obvious abnormality, atraumatic Eyes: EOMI Lungs: mildly coarse BS, no wheezing Heart: irregularly irregular rhythm and S1, S2 normal Abdomen: normal findings: bowel sounds normal Extremities: no edema Skin: mobility and turgor normal Neurologic: Grossly normal  Consultants:   n/a  Procedures:   n/a  Data Reviewed: I have personally reviewed following labs and imaging studies Results for orders placed or performed during the hospital encounter of 08/30/20 (from the past 24 hour(s))  CBG monitoring, ED     Status: Abnormal   Collection Time: 08/31/20  5:08 PM  Result Value Ref Range   Glucose-Capillary 142 (H) 70 - 99 mg/dL  Glucose, capillary     Status: Abnormal   Collection Time: 08/31/20  9:03 PM  Result Value Ref Range   Glucose-Capillary 163 (H) 70 - 99 mg/dL  Basic metabolic panel     Status: Abnormal   Collection Time: 09/01/20  1:45 AM  Result Value Ref Range   Sodium 133 (L) 135 - 145 mmol/L   Potassium 4.1 3.5 - 5.1 mmol/L   Chloride 102 98 - 111 mmol/L   CO2 24 22 - 32 mmol/L   Glucose, Bld 127 (H) 70 - 99 mg/dL   BUN 16 8 - 23 mg/dL   Creatinine, Ser 0.94 0.44 - 1.00 mg/dL   Calcium 8.4 (L) 8.9 - 10.3 mg/dL   GFR, Estimated 59 (L) >60 mL/min   Anion gap 7 5 - 15  CBC with Differential/Platelet     Status: Abnormal   Collection Time: 09/01/20  1:45 AM  Result Value Ref Range   WBC 11.8 (H) 4.0 - 10.5 K/uL   RBC 3.49 (L) 3.87 - 5.11 MIL/uL   Hemoglobin 10.4 (L) 12.0 - 15.0 g/dL   HCT 32.8 (L) 36.0 - 46.0 %   MCV 94.0 80.0 - 100.0 fL   MCH 29.8 26.0 - 34.0 pg   MCHC 31.7 30.0 - 36.0  g/dL   RDW 14.8 11.5 - 15.5 %   Platelets 192 150 - 400 K/uL   nRBC 0.0 0.0 - 0.2 %   Neutrophils Relative % 77 %   Neutro Abs 9.1 (H) 1.7 - 7.7 K/uL   Lymphocytes Relative 13 %   Lymphs Abs 1.6 0.7 - 4.0 K/uL   Monocytes Relative 8 %   Monocytes Absolute 1.0 0.1 - 1.0 K/uL   Eosinophils Relative 1 %   Eosinophils Absolute 0.1 0.0 - 0.5 K/uL   Basophils Relative 0 %   Basophils Absolute 0.0 0.0 - 0.1 K/uL   Immature Granulocytes 1 %   Abs Immature Granulocytes 0.11 (H) 0.00 - 0.07 K/uL  Magnesium     Status: None   Collection Time: 09/01/20  1:45 AM  Result Value Ref Range   Magnesium 1.9 1.7 - 2.4 mg/dL  Procalcitonin     Status: None   Collection Time:  09/01/20  1:45 AM  Result Value Ref Range   Procalcitonin 0.79 ng/mL  Glucose, capillary     Status: Abnormal   Collection Time: 09/01/20  7:55 AM  Result Value Ref Range   Glucose-Capillary 148 (H) 70 - 99 mg/dL    Recent Results (from the past 240 hour(s))  Blood culture (routine single)     Status: None (Preliminary result)   Collection Time: 08/30/20  6:33 PM   Specimen: BLOOD  Result Value Ref Range Status   Specimen Description BLOOD BLOOD RIGHT FOREARM  Final   Special Requests   Final    BOTTLES DRAWN AEROBIC ONLY Blood Culture results may not be optimal due to an inadequate volume of blood received in culture bottles   Culture   Final    NO GROWTH < 12 HOURS Performed at [facility], [place] [address] [place], [place] [address]    Report Status PENDING  Incomplete  Resp Panel by RT-PCR (Flu A&B, Covid) Nasopharyngeal Swab     Status: None   Collection Time: 08/30/20  6:33 PM   Specimen: Nasopharyngeal Swab; Nasopharyngeal(NP) swabs in vial transport medium  Result Value Ref Range Status   SARS Coronavirus 2 by RT PCR NEGATIVE NEGATIVE Final    Comment: (NOTE) SARS-CoV-2 target nucleic acids are NOT DETECTED.  The SARS-CoV-2 RNA is generally detectable in upper respiratory specimens during the acute  phase of infection. The lowest concentration of SARS-CoV-2 viral copies this assay can detect is 138 copies/mL. A negative result does not preclude SARS-Cov-2 infection and should not be used as the sole basis for treatment or other patient management decisions. A negative result may occur with  improper specimen collection/handling, submission of specimen other than nasopharyngeal swab, presence of viral mutation(s) within the areas targeted by this assay, and inadequate number of viral copies(<138 copies/mL). A negative result must be combined with clinical observations, patient history, and epidemiological information. The expected result is Negative.  Fact Sheet for Patients:  EntrepreneurPulse.com.au  Fact Sheet for Healthcare Providers:  IncredibleEmployment.be  This test is no t yet approved or cleared by the [place] FDA and  has been authorized for detection and/or diagnosis of SARS-CoV-2 by FDA under an Emergency Use Authorization (EUA). This EUA will remain  in effect (meaning this test can be used) for the duration of the COVID-19 declaration under Section 564(b)(1) of the Act, 21 U.S.C.section 360bbb-3(b)(1), unless the authorization is terminated  or revoked sooner.       Influenza A by PCR NEGATIVE NEGATIVE Final   Influenza B by PCR NEGATIVE NEGATIVE Final    Comment: (NOTE) The Xpert Xpress SARS-CoV-2/FLU/RSV plus assay is intended as an aid in the diagnosis of influenza from Nasopharyngeal swab specimens and should not be used as a sole basis for treatment. Nasal washings and aspirates are unacceptable for Xpert Xpress SARS-CoV-2/FLU/RSV testing.  Fact Sheet for Patients: EntrepreneurPulse.com.au  Fact Sheet for Healthcare Providers: IncredibleEmployment.be  This test is not yet approved or cleared by the [place] FDA and has been authorized for detection and/or diagnosis of  SARS-CoV-2 by FDA under an Emergency Use Authorization (EUA). This EUA will remain in effect (meaning this test can be used) for the duration of the COVID-19 declaration under Section 564(b)(1) of the Act, 21 U.S.C. section 360bbb-3(b)(1), unless the authorization is terminated or revoked.  Performed at [facility], [place] [address], [place], [place] [address]   Culture, blood (single)     Status: None (  Preliminary result)   Collection Time: 08/30/20  8:25 PM   Specimen: BLOOD RIGHT HAND  Result Value Ref Range Status   Specimen Description BLOOD RIGHT HAND  Final   Special Requests   Final    BOTTLES DRAWN AEROBIC AND ANAEROBIC Blood Culture results may not be optimal due to an inadequate volume of blood received in culture bottles   Culture   Final    NO GROWTH < 12 HOURS Performed at [facility], [place] [address], [place], [place] [address]    Report Status PENDING  Incomplete  MRSA PCR Screening     Status: None   Collection Time: 08/31/20  1:07 AM   Specimen: Nasal Mucosa; Nasopharyngeal  Result Value Ref Range Status   MRSA by PCR NEGATIVE NEGATIVE Final    Comment:        The GeneXpert MRSA Assay (FDA approved for NASAL specimens only), is one component of a comprehensive MRSA colonization surveillance program. It is not intended to diagnose MRSA infection nor to guide or monitor treatment for MRSA infections. Performed at [facility], [place] [address] [place], [place] [address]      Radiology Studies: DG Chest Port 1 View  Result Date: 08/30/2020 CLINICAL DATA:  Sepsis. EXAM: PORTABLE CHEST 1 VIEW COMPARISON:  08/28/2020 FINDINGS: Cardiomegaly remains stable. Mild infiltrate or atelectasis in the left lung base appears increased since previous study. Linear opacity at the right lung base is stable and may be due to subsegmental atelectasis or scarring. IMPRESSION: Increased mild left basilar infiltrate or atelectasis. Stable right basilar  atelectasis versus scarring. Stable cardiomegaly. Electronically Signed   By: Marlaine Hind M.D.   On: 08/30/2020 18:27   DG Chest Port 1 View  Final Result      Scheduled Meds: . aspirin  325 mg Oral QHS  . carvedilol  3.125 mg Oral BID WC  . clonazePAM  0.5 mg Oral QHS  . donepezil  10 mg Oral QHS  . enoxaparin (LOVENOX) injection  40 mg Subcutaneous Q24H  . insulin aspart  0-15 Units Subcutaneous TID WC  . latanoprost  1 drop Both Eyes QHS  . levothyroxine  50 mcg Oral QAC breakfast  . metroNIDAZOLE  500 mg Oral Q8H  . pantoprazole  40 mg Oral Daily  . sodium chloride flush  3 mL Intravenous Q12H   PRN Meds: acetaminophen **OR** acetaminophen, polyethylene glycol Continuous Infusions: . ceFEPime (MAXIPIME) IV 2 g (08/31/20 2126)     LOS: 1 day  Time spent: Greater than 50% of the 35 minute visit was spent in counseling/coordination of care for the patient as laid out in the A&P.   Dwyane Dee, MD [facility] 09/01/2020, 9:39 AM

## 2020-09-02 ENCOUNTER — Inpatient Hospital Stay (HOSPITAL_COMMUNITY): Payer: Medicare Other

## 2020-09-02 DIAGNOSIS — R6520 Severe sepsis without septic shock: Secondary | ICD-10-CM | POA: Diagnosis not present

## 2020-09-02 DIAGNOSIS — A419 Sepsis, unspecified organism: Secondary | ICD-10-CM | POA: Diagnosis not present

## 2020-09-02 LAB — BASIC METABOLIC PANEL
Anion gap: 10 (ref 5–15)
BUN: 15 mg/dL (ref 8–23)
CO2: 22 mmol/L (ref 22–32)
Calcium: 8.4 mg/dL — ABNORMAL LOW (ref 8.9–10.3)
Chloride: 101 mmol/L (ref 98–111)
Creatinine, Ser: 0.79 mg/dL (ref 0.44–1.00)
GFR, Estimated: >60 mL/min (ref >60)
Glucose, Bld: 123 mg/dL — ABNORMAL HIGH (ref 70–99)
Potassium: 4 mmol/L (ref 3.5–5.1)
Sodium: 133 mmol/L — ABNORMAL LOW (ref 135–145)

## 2020-09-02 LAB — CBC WITH DIFFERENTIAL/PLATELET
Abs Immature Granulocytes: 0.06 10*3/uL (ref 0.00–0.07)
Basophils Absolute: 0 10*3/uL (ref 0.0–0.1)
Basophils Relative: 0 %
Eosinophils Absolute: 0.1 10*3/uL (ref 0.0–0.5)
Eosinophils Relative: 1 %
HCT: 30.8 % — ABNORMAL LOW (ref 36.0–46.0)
Hemoglobin: 10.4 g/dL — ABNORMAL LOW (ref 12.0–15.0)
Immature Granulocytes: 1 %
Lymphocytes Relative: 13 %
Lymphs Abs: 1.4 10*3/uL (ref 0.7–4.0)
MCH: 31 pg (ref 26.0–34.0)
MCHC: 33.8 g/dL (ref 30.0–36.0)
MCV: 91.9 fL (ref 80.0–100.0)
Monocytes Absolute: 0.7 10*3/uL (ref 0.1–1.0)
Monocytes Relative: 7 %
Neutro Abs: 8.4 10*3/uL — ABNORMAL HIGH (ref 1.7–7.7)
Neutrophils Relative %: 78 %
Platelets: 199 10*3/uL (ref 150–400)
RBC: 3.35 MIL/uL — ABNORMAL LOW (ref 3.87–5.11)
RDW: 14.9 % (ref 11.5–15.5)
WBC: 10.7 10*3/uL — ABNORMAL HIGH (ref 4.0–10.5)
nRBC: 0 % (ref 0.0–0.2)

## 2020-09-02 LAB — GLUCOSE, CAPILLARY
Glucose-Capillary: 127 mg/dL — ABNORMAL HIGH (ref 70–99)
Glucose-Capillary: 131 mg/dL — ABNORMAL HIGH (ref 70–99)
Glucose-Capillary: 137 mg/dL — ABNORMAL HIGH (ref 70–99)
Glucose-Capillary: 226 mg/dL — ABNORMAL HIGH (ref 70–99)

## 2020-09-02 LAB — MAGNESIUM: Magnesium: 1.9 mg/dL (ref 1.7–2.4)

## 2020-09-02 LAB — PROCALCITONIN: Procalcitonin: 0.5 ng/mL

## 2020-09-02 MED ORDER — AZITHROMYCIN 250 MG PO TABS
500.0000 mg | ORAL_TABLET | Freq: Every day | ORAL | Status: AC
  Administered 2020-09-02 – 2020-09-07 (×6): 500 mg via ORAL
  Filled 2020-09-02 (×6): qty 2

## 2020-09-02 MED ORDER — CITALOPRAM HYDROBROMIDE 20 MG PO TABS
20.0000 mg | ORAL_TABLET | Freq: Every day | ORAL | Status: DC
  Administered 2020-09-02 – 2020-09-16 (×15): 20 mg via ORAL
  Filled 2020-09-02 (×15): qty 1

## 2020-09-02 NOTE — TOC Progression Note (Signed)
Transition of Care [facility]) - Progression Note    Patient Details  Name: Ashlee Greer MRN: [id number] Date of Birth: 06-08-1933  Transition of Care [facility]) CM/SW [place], [place] Phone Number: [phone] 09/02/2020, 10:30 AM  Clinical Narrative:     CSW was messaged by RN Case Manager Colletta [place] that patient's daughter was interested in 6 a SNF placement. Preference for patient is [facility] and [facility].  CSW called [place] to obtain the fax number. CSW faxed referral and therapy note to Soil scientist Leanne at [phone]. Also referral faxed out to other facilities as well.  TOC team will continue to assist with discharge planning needs.  Expected Discharge Plan: [place] Barriers to Discharge: Continued Medical Work up  Expected Discharge Plan and Services Expected Discharge Plan: [place]   Discharge Planning Services: CM Consult Post Acute Care Choice: Durable Medical Equipment Living arrangements for the past 2 months: Single Family Home                                       Social Determinants of Health (SDOH) Interventions    Readmission Risk Interventions No flowsheet data found.

## 2020-09-02 NOTE — Progress Notes (Addendum)
PROGRESS NOTE    Ashlee Greer   TUU:[id number]  DOB: 12-07-1932  DOA: 08/30/2020     2  PCP: [name], MD  CC: hypotension at home  Hospital Course: Ashlee Greer is a 86 y.o. female with medical history significant of A. fib not on anticoagulation, anxiety/depression, arthritis, diabetes, glaucoma, hypertension, hypothyroidism who presented with recurrent loss of consciousness secondary to orthostatic hypotension.  Patient was recently discharged from [facility] after treatment for TIA, UTI, pneumonia.  She also had some fatigue and orthostasis while she was there.  She was discharged on Bactrim for UTI and Levaquin for her pneumonia.  She was recommended to go to an SNF at discharge but family is an Therapist, sports and stated they would care for patient at home.  However, her BP remained low prior to discharge which concerned family.  Due to ongoing ortho hypoTN on returning home, family brought patient to the hospital.    Workup in the ER was notable for temp 101.4, tachypnea, tachycardia. CXR showed left basilar infiltrate vs atelectasis.  Lactic was elevated and she was started on IVF and cefepime. Covid and flu swab negative.  She continued to improve with antibiotics. She was evaluated by physical therapy and recommended for SNF rehab at discharge.  This was discussed with her family who was also in agreement.   Interval History:  Feeling okay overall.  Cough and breathing has improved.  Discussed with family today regarding her weakness and deconditioning.  Talked with Carlos American and Rod Holler.  They are in agreement with pursuing rehab.  Social work has initiated process.  Old records reviewed in assessment of this patient  ROS: Constitutional: negative for chills, Respiratory: positive for cough, Cardiovascular: negative for chest pain and Gastrointestinal: negative for abdominal pain  Assessment & Plan: * Severe sepsis ([place]) - source suspected pulm at this time given subtle  LLE opacity; possibility of aspiration if syncopizing as well - PCT 0.68>>0.79>>0.5 - now clinically improved on IV abx; okay to change to Azithro (continue flagyl) and will complete course (d/c cefepime) - follow up cultures: negative to date  Orthostatic hypotension-resolved as of 09/02/2020 -Also possibly multifactorial in setting of infection as well as polypharmacy.  Her medications reviewed and she is on sedating medications including clonazepam as well as anticholinergic mechanisms with nortriptyline which will be held; I have also counseled her bedside that her clonazepam dose needs to be reduced at discharge and ideally would benefit from weaning off of it altogether -resume celexa at lower dose, 20 mg daily on 12/25 -Continue monitoring blood pressure which is improving -Repeat orthostatic blood pressure: negative on 12/24  History of TIA (transient ischemic attack) -Continue aspirin for now -Obtaining records from [facility] and depression -See orthostatic hypotension -Clonazepam dose significantly reduced and holding nortriptyline -Celexa on hold but will try to resume soon  Unspecified atrial fibrillation ([place]) -Still in A. fib with.  Unclear discussion about anticoagulation.  Obtaining records from [place] for further clarification -Continue Coreg for now  Acute lower UTI -She does endorse some symptoms.  UA not very impressive however (neg nitrite, neg LE, 0-5 WBC, and only rare bacteria) - regardless treatment for PNA will cover urine most likely   Hypothyroidism -Continue Synthroid  Diabetes (HCC) -Continue SSI and CBG monitoring   Antimicrobials: Cefepime 12/22>>12/25 Flagyl 12/23>>12/25 Augmentin 12/25>>current  DVT prophylaxis: Lovenox Code Status: Partial, Intubate only  Family Communication:  Disposition Plan: Status is: Inpatient  Remains inpatient appropriate because:Ongoing diagnostic testing  needed not appropriate for outpatient work up,  Unsafe d/c plan, IV treatments appropriate due to intensity of illness or inability to take PO and Inpatient level of care appropriate due to severity of illness   Dispo: The patient is from: Home              Anticipated d/c is to: Home              Anticipated d/c date is: 1 day              Patient currently is not medically stable to d/c.  Objective: Blood pressure 135/84, pulse 79, temperature 98.3 F (36.8 C), temperature source Oral, resp. rate 18, height 5\' 1"  (1.549 m), weight 88.5 kg, SpO2 96 %.  Examination: General appearance: alert, cooperative and no distress Head: Normocephalic, without obvious abnormality, atraumatic Eyes: EOMI Lungs: mildly coarse BS, no wheezing Heart: irregularly irregular rhythm and S1, S2 normal Abdomen: normal findings: bowel sounds normal Extremities: no edema Skin: mobility and turgor normal Neurologic: Grossly normal, just very weak/deconditioned   Consultants:   n/a  Procedures:   n/a  Data Reviewed: I have personally reviewed following labs and imaging studies Results for orders placed or performed during the hospital encounter of 08/30/20 (from the past 24 hour(s))  Glucose, capillary     Status: Abnormal   Collection Time: 09/01/20 11:46 AM  Result Value Ref Range   Glucose-Capillary 249 (H) 70 - 99 mg/dL  Respiratory Panel by PCR     Status: None   Collection Time: 09/01/20  1:48 PM   Specimen: Nasopharyngeal Swab; Respiratory  Result Value Ref Range   Adenovirus NOT DETECTED NOT DETECTED   Coronavirus 229E NOT DETECTED NOT DETECTED   Coronavirus HKU1 NOT DETECTED NOT DETECTED   Coronavirus NL63 NOT DETECTED NOT DETECTED   Coronavirus OC43 NOT DETECTED NOT DETECTED   Metapneumovirus NOT DETECTED NOT DETECTED   Rhinovirus / Enterovirus NOT DETECTED NOT DETECTED   Influenza A NOT DETECTED NOT DETECTED   Influenza B NOT DETECTED NOT DETECTED   Parainfluenza Virus 1 NOT DETECTED NOT DETECTED   Parainfluenza Virus 2 NOT  DETECTED NOT DETECTED   Parainfluenza Virus 3 NOT DETECTED NOT DETECTED   Parainfluenza Virus 4 NOT DETECTED NOT DETECTED   Respiratory Syncytial Virus NOT DETECTED NOT DETECTED   Bordetella pertussis NOT DETECTED NOT DETECTED   Bordetella Parapertussis NOT DETECTED NOT DETECTED   Chlamydophila pneumoniae NOT DETECTED NOT DETECTED   Mycoplasma pneumoniae NOT DETECTED NOT DETECTED  Glucose, capillary     Status: Abnormal   Collection Time: 09/01/20  4:53 PM  Result Value Ref Range   Glucose-Capillary 149 (H) 70 - 99 mg/dL  Glucose, capillary     Status: Abnormal   Collection Time: 09/01/20  9:13 PM  Result Value Ref Range   Glucose-Capillary 117 (H) 70 - 99 mg/dL  Basic metabolic panel     Status: Abnormal   Collection Time: 09/02/20  1:42 AM  Result Value Ref Range   Sodium 133 (L) 135 - 145 mmol/L   Potassium 4.0 3.5 - 5.1 mmol/L   Chloride 101 98 - 111 mmol/L   CO2 22 22 - 32 mmol/L   Glucose, Bld 123 (H) 70 - 99 mg/dL   BUN 15 8 - 23 mg/dL   Creatinine, Ser 0.79 0.44 - 1.00 mg/dL   Calcium 8.4 (L) 8.9 - 10.3 mg/dL   GFR, Estimated >60 >60 mL/min   Anion gap 10 5 - 15  CBC with Differential/Platelet     Status: Abnormal   Collection Time: 09/02/20  1:42 AM  Result Value Ref Range   WBC 10.7 (H) 4.0 - 10.5 K/uL   RBC 3.35 (L) 3.87 - 5.11 MIL/uL   Hemoglobin 10.4 (L) 12.0 - 15.0 g/dL   HCT 30.8 (L) 36.0 - 46.0 %   MCV 91.9 80.0 - 100.0 fL   MCH 31.0 26.0 - 34.0 pg   MCHC 33.8 30.0 - 36.0 g/dL   RDW 14.9 11.5 - 15.5 %   Platelets 199 150 - 400 K/uL   nRBC 0.0 0.0 - 0.2 %   Neutrophils Relative % 78 %   Neutro Abs 8.4 (H) 1.7 - 7.7 K/uL   Lymphocytes Relative 13 %   Lymphs Abs 1.4 0.7 - 4.0 K/uL   Monocytes Relative 7 %   Monocytes Absolute 0.7 0.1 - 1.0 K/uL   Eosinophils Relative 1 %   Eosinophils Absolute 0.1 0.0 - 0.5 K/uL   Basophils Relative 0 %   Basophils Absolute 0.0 0.0 - 0.1 K/uL   Immature Granulocytes 1 %   Abs Immature Granulocytes 0.06 0.00 - 0.07  K/uL  Magnesium     Status: None   Collection Time: 09/02/20  1:42 AM  Result Value Ref Range   Magnesium 1.9 1.7 - 2.4 mg/dL  Procalcitonin     Status: None   Collection Time: 09/02/20  1:42 AM  Result Value Ref Range   Procalcitonin 0.50 ng/mL  Glucose, capillary     Status: Abnormal   Collection Time: 09/02/20  8:15 AM  Result Value Ref Range   Glucose-Capillary 127 (H) 70 - 99 mg/dL    Recent Results (from the past 240 hour(s))  Blood culture (routine single)     Status: None (Preliminary result)   Collection Time: 08/30/20  6:33 PM   Specimen: BLOOD  Result Value Ref Range Status   Specimen Description BLOOD BLOOD RIGHT FOREARM  Final   Special Requests   Final    BOTTLES DRAWN AEROBIC ONLY Blood Culture results may not be optimal due to an inadequate volume of blood received in culture bottles   Culture   Final    NO GROWTH 2 DAYS Performed at [facility], [place] [address], [place], [place] [address]    Report Status PENDING  Incomplete  Resp Panel by RT-PCR (Flu A&B, Covid) Nasopharyngeal Swab     Status: None   Collection Time: 08/30/20  6:33 PM   Specimen: Nasopharyngeal Swab; Nasopharyngeal(NP) swabs in vial transport medium  Result Value Ref Range Status   SARS Coronavirus 2 by RT PCR NEGATIVE NEGATIVE Final    Comment: (NOTE) SARS-CoV-2 target nucleic acids are NOT DETECTED.  The SARS-CoV-2 RNA is generally detectable in upper respiratory specimens during the acute phase of infection. The lowest concentration of SARS-CoV-2 viral copies this assay can detect is 138 copies/mL. A negative result does not preclude SARS-Cov-2 infection and should not be used as the sole basis for treatment or other patient management decisions. A negative result may occur with  improper specimen collection/handling, submission of specimen other than nasopharyngeal swab, presence of viral mutation(s) within the areas targeted by this assay, and inadequate number of  viral copies(<138 copies/mL). A negative result must be combined with clinical observations, patient history, and epidemiological information. The expected result is Negative.  Fact Sheet for Patients:  EntrepreneurPulse.com.au  Fact Sheet for Healthcare Providers:  IncredibleEmployment.be  This test is no t yet approved  or cleared by the [place] and  has been authorized for detection and/or diagnosis of SARS-CoV-2 by FDA under an Emergency Use Authorization (EUA). This EUA will remain  in effect (meaning this test can be used) for the duration of the COVID-19 declaration under Section 564(b)(1) of the Act, 21 U.S.C.section 360bbb-3(b)(1), unless the authorization is terminated  or revoked sooner.       Influenza A by PCR NEGATIVE NEGATIVE Final   Influenza B by PCR NEGATIVE NEGATIVE Final    Comment: (NOTE) The Xpert Xpress SARS-CoV-2/FLU/RSV plus assay is intended as an aid in the diagnosis of influenza from Nasopharyngeal swab specimens and should not be used as a sole basis for treatment. Nasal washings and aspirates are unacceptable for Xpert Xpress SARS-CoV-2/FLU/RSV testing.  Fact Sheet for Patients: EntrepreneurPulse.com.au  Fact Sheet for Healthcare Providers: IncredibleEmployment.be  This test is not yet approved or cleared by the [place] FDA and has been authorized for detection and/or diagnosis of SARS-CoV-2 by FDA under an Emergency Use Authorization (EUA). This EUA will remain in effect (meaning this test can be used) for the duration of the COVID-19 declaration under Section 564(b)(1) of the Act, 21 U.S.C. section 360bbb-3(b)(1), unless the authorization is terminated or revoked.  Performed at [facility], [place] [address], [place], [place] [address]   Culture, blood (single)     Status: None (Preliminary result)   Collection Time: 08/30/20  8:25 PM   Specimen:  BLOOD RIGHT HAND  Result Value Ref Range Status   Specimen Description BLOOD RIGHT HAND  Final   Special Requests   Final    BOTTLES DRAWN AEROBIC AND ANAEROBIC Blood Culture results may not be optimal due to an inadequate volume of blood received in culture bottles   Culture   Final    NO GROWTH 2 DAYS Performed at [facility], [place] [address], [place], [place] [address]    Report Status PENDING  Incomplete  MRSA PCR Screening     Status: None   Collection Time: 08/31/20  1:07 AM   Specimen: Nasal Mucosa; Nasopharyngeal  Result Value Ref Range Status   MRSA by PCR NEGATIVE NEGATIVE Final    Comment:        The GeneXpert MRSA Assay (FDA approved for NASAL specimens only), is one component of a comprehensive MRSA colonization surveillance program. It is not intended to diagnose MRSA infection nor to guide or monitor treatment for MRSA infections. Performed at [facility], [place] [address], [place], [place] [address]   Respiratory Panel by PCR     Status: None   Collection Time: 09/01/20  1:48 PM   Specimen: Nasopharyngeal Swab; Respiratory  Result Value Ref Range Status   Adenovirus NOT DETECTED NOT DETECTED Final   Coronavirus 229E NOT DETECTED NOT DETECTED Final    Comment: (NOTE) The Coronavirus on the Respiratory Panel, DOES NOT test for the novel  Coronavirus (2019 nCoV)    Coronavirus HKU1 NOT DETECTED NOT DETECTED Final   Coronavirus NL63 NOT DETECTED NOT DETECTED Final   Coronavirus OC43 NOT DETECTED NOT DETECTED Final   Metapneumovirus NOT DETECTED NOT DETECTED Final   Rhinovirus / Enterovirus NOT DETECTED NOT DETECTED Final   Influenza A NOT DETECTED NOT DETECTED Final   Influenza B NOT DETECTED NOT DETECTED Final   Parainfluenza Virus 1 NOT DETECTED NOT DETECTED Final   Parainfluenza Virus 2 NOT DETECTED NOT DETECTED Final   Parainfluenza Virus 3 NOT DETECTED NOT DETECTED  Final   Parainfluenza Virus 4 NOT DETECTED NOT DETECTED Final   Respiratory  Syncytial Virus NOT DETECTED NOT DETECTED Final   Bordetella pertussis NOT DETECTED NOT DETECTED Final   Bordetella Parapertussis NOT DETECTED NOT DETECTED Final   Chlamydophila pneumoniae NOT DETECTED NOT DETECTED Final   Mycoplasma pneumoniae NOT DETECTED NOT DETECTED Final    Comment: Performed at [facility], [place] [address] [place], [place] [address]     Radiology Studies: No results found. DG Chest Port 1 View  Final Result      Scheduled Meds: . aspirin  325 mg Oral QHS  . azithromycin  500 mg Oral Daily  . carvedilol  3.125 mg Oral BID WC  . citalopram  20 mg Oral QHS  . clonazePAM  0.5 mg Oral QHS  . donepezil  10 mg Oral QHS  . enoxaparin (LOVENOX) injection  40 mg Subcutaneous Q24H  . insulin aspart  0-15 Units Subcutaneous TID WC  . latanoprost  1 drop Both Eyes QHS  . levothyroxine  50 mcg Oral QAC breakfast  . metroNIDAZOLE  500 mg Oral Q8H  . pantoprazole  40 mg Oral Daily  . sodium chloride flush  3 mL Intravenous Q12H   PRN Meds: acetaminophen **OR** acetaminophen, polyethylene glycol Continuous Infusions:    LOS: 2 days  Time spent: Greater than 50% of the 35 minute visit was spent in counseling/coordination of care for the patient as laid out in the A&P.   Dwyane Dee, MD [facility] 09/02/2020, 11:23 AM

## 2020-09-02 NOTE — NC FL2 (Signed)
[place] LEVEL OF CARE SCREENING TOOL     IDENTIFICATION  Patient Name: Ashlee Greer Birthdate: 1933/07/09 Sex: female Admission Date (Current Location): 08/30/2020  [facility] and [place] Number:  Herbalist and Address:  [facility], [place] [address], [place], [place] [address]      Provider Number: [id number]  Attending Physician Name and Address:  Dwyane Dee, MD  Relative Name and Phone Number:  Judson Roch [phone]    Current Level of Care: Hospital Recommended Level of Care: [place] Prior Approval Number:    Date Approved/Denied:   PASRR Number: [id number]  Discharge Plan: SNF    Current Diagnoses: Patient Active Problem List   Diagnosis Date Noted  . Severe sepsis ([place]) 08/30/2020  . Acute lower UTI 08/30/2020  . Pneumonia 08/30/2020  . Orthostatic hypotension 08/30/2020  . History of TIA (transient ischemic attack) 08/30/2020  . Unspecified atrial fibrillation ([place]) 08/30/2020  . Anxiety and depression 08/30/2020  . Depression 08/30/2020  . Diabetes ([place]) 08/30/2020  . Hypothyroidism 08/30/2020    Orientation RESPIRATION BLADDER Height & Weight     Self,Time  O2 (4l) Continent Weight: 195 lb (88.5 kg) Height:  5\' 1"  (154.9 cm)  BEHAVIORAL SYMPTOMS/MOOD NEUROLOGICAL BOWEL NUTRITION STATUS      Continent Diet (see discharge summary)  AMBULATORY STATUS COMMUNICATION OF NEEDS Skin   Total Care Verbally Other (Comment) (dry, redness buttocks)                       Personal Care Assistance Level of Assistance  Bathing,Feeding,Dressing Bathing Assistance: Maximum assistance Feeding assistance: Limited assistance Dressing Assistance: Maximum assistance     Functional Limitations Info  Sight,Hearing,Speech Sight Info: Impaired Hearing Info: Adequate Speech Info: Adequate    SPECIAL CARE FACTORS FREQUENCY  PT (By licensed PT),OT (By licensed OT)     PT Frequency: 5x per  week OT Frequency: 5x per week            Contractures Contractures Info: Not present    Additional Factors Info  Code Status,Allergies,Insulin Sliding Scale Code Status Info: Partial Allergies Info: Nisoldipine, Penicillin G, Ace Inhibitors, Eliquis (Apixaban)   Insulin Sliding Scale Info: insulin aspart (novoLOG) injection 0-15 Units   with meals       Current Medications (09/02/2020):  This is the current hospital active medication list Current Facility-Administered Medications  Medication Dose Route Frequency Provider Last Rate Last Admin  . acetaminophen (TYLENOL) tablet 650 mg  650 mg Oral Q6H PRN Marcelyn Bruins, MD   650 mg at 09/02/20 0827   Or  . acetaminophen (TYLENOL) suppository 650 mg  650 mg Rectal Q6H PRN Marcelyn Bruins, MD      . aspirin tablet 325 mg  325 mg Oral Standley Brooking, MD   325 mg at 09/01/20 2228  . carvedilol (COREG) tablet 3.125 mg  3.125 mg Oral BID WC Marcelyn Bruins, MD   3.125 mg at 09/02/20 0827  . ceFEPIme (MAXIPIME) 2 g in sodium chloride 0.9 % 100 mL IVPB  2 g Intravenous Q12H Marcelyn Bruins, MD 200 mL/hr at 09/01/20 2233 2 g at 09/01/20 2233  . clonazePAM (KLONOPIN) tablet 0.5 mg  0.5 mg Oral QHS Marcelyn Bruins, MD   0.5 mg at 09/01/20 2228  . donepezil (ARICEPT) tablet 10 mg  10 mg Oral Standley Brooking, MD   10 mg at 09/01/20 2228  . enoxaparin (  LOVENOX) injection 40 mg  40 mg Subcutaneous Q24H Marcelyn Bruins, MD   40 mg at 09/01/20 2228  . insulin aspart (novoLOG) injection 0-15 Units  0-15 Units Subcutaneous TID WC Marcelyn Bruins, MD   2 Units at 09/02/20 0827  . latanoprost (XALATAN) 0.005 % ophthalmic solution 1 drop  1 drop Both Eyes QHS Dwyane Dee, MD   1 drop at 09/01/20 2229  . levothyroxine (SYNTHROID) tablet 50 mcg  50 mcg Oral QAC breakfast Marcelyn Bruins, MD   50 mcg at 09/02/20 0513  . metroNIDAZOLE (FLAGYL) tablet 500 mg  500 mg Oral Lenise Arena, MD   500 mg at 09/02/20  0569  . pantoprazole (PROTONIX) EC tablet 40 mg  40 mg Oral Daily Dwyane Dee, MD   40 mg at 09/01/20 1029  . polyethylene glycol (MIRALAX / GLYCOLAX) packet 17 g  17 g Oral Daily PRN Marcelyn Bruins, MD      . sodium chloride flush (NS) 0.9 % injection 3 mL  3 mL Intravenous Q12H Marcelyn Bruins, MD   3 mL at 09/01/20 2229     Discharge Medications: Please see discharge summary for a list of discharge medications.  Relevant Imaging Results:  Relevant Lab Results:   Additional Information SSN# [id number] received 1st dose of vaccine  Bary Castilla, LCSW

## 2020-09-02 NOTE — TOC Progression Note (Signed)
Transition of Care [facility]) - Progression Note    Patient Details  Name: Ashlee Greer MRN: [id number] Date of Birth: Oct 08, 1932  Transition of Care [facility]) CM/SW [place], RN Phone Number: 09/02/2020, 9:01 AM  Clinical Narrative:    Damaris Schooner to Daughter Judson Roch about rehab. They are requesting inpatient rehab , however this is not the recommendation from PT and the patient doe not appear to be able to withstand 3 hours of intensive therapy. They are requesting [place] or [place]. They are familiar with Medicare.gov and their choices.Discussed HH afterward.     Expected Discharge Plan: [place] Barriers to Discharge: Continued Medical Work up  Expected Discharge Plan and Services Expected Discharge Plan: [place]   Discharge Planning Services: CM Consult Post Acute Care Choice: Durable Medical Equipment Living arrangements for the past 2 months: Single Family Home                                       Social Determinants of Health (SDOH) Interventions    Readmission Risk Interventions No flowsheet data found.

## 2020-09-03 DIAGNOSIS — R6520 Severe sepsis without septic shock: Secondary | ICD-10-CM | POA: Diagnosis not present

## 2020-09-03 DIAGNOSIS — A419 Sepsis, unspecified organism: Secondary | ICD-10-CM | POA: Diagnosis not present

## 2020-09-03 LAB — CBC WITH DIFFERENTIAL/PLATELET
Abs Immature Granulocytes: 0.1 10*3/uL — ABNORMAL HIGH (ref 0.00–0.07)
Basophils Absolute: 0 10*3/uL (ref 0.0–0.1)
Basophils Relative: 0 %
Eosinophils Absolute: 0.1 10*3/uL (ref 0.0–0.5)
Eosinophils Relative: 1 %
HCT: 34.2 % — ABNORMAL LOW (ref 36.0–46.0)
Hemoglobin: 11.2 g/dL — ABNORMAL LOW (ref 12.0–15.0)
Immature Granulocytes: 1 %
Lymphocytes Relative: 16 %
Lymphs Abs: 1.6 10*3/uL (ref 0.7–4.0)
MCH: 30.3 pg (ref 26.0–34.0)
MCHC: 32.7 g/dL (ref 30.0–36.0)
MCV: 92.4 fL (ref 80.0–100.0)
Monocytes Absolute: 1 10*3/uL (ref 0.1–1.0)
Monocytes Relative: 11 %
Neutro Abs: 6.9 10*3/uL (ref 1.7–7.7)
Neutrophils Relative %: 71 %
Platelets: 230 10*3/uL (ref 150–400)
RBC: 3.7 MIL/uL — ABNORMAL LOW (ref 3.87–5.11)
RDW: 14.8 % (ref 11.5–15.5)
WBC: 9.7 10*3/uL (ref 4.0–10.5)
nRBC: 0 % (ref 0.0–0.2)

## 2020-09-03 LAB — BASIC METABOLIC PANEL
Anion gap: 8 (ref 5–15)
BUN: 15 mg/dL (ref 8–23)
CO2: 25 mmol/L (ref 22–32)
Calcium: 8.4 mg/dL — ABNORMAL LOW (ref 8.9–10.3)
Chloride: 103 mmol/L (ref 98–111)
Creatinine, Ser: 0.77 mg/dL (ref 0.44–1.00)
GFR, Estimated: >60 mL/min (ref >60)
Glucose, Bld: 143 mg/dL — ABNORMAL HIGH (ref 70–99)
Potassium: 4.5 mmol/L (ref 3.5–5.1)
Sodium: 136 mmol/L (ref 135–145)

## 2020-09-03 LAB — MAGNESIUM: Magnesium: 2 mg/dL (ref 1.7–2.4)

## 2020-09-03 LAB — GLUCOSE, CAPILLARY
Glucose-Capillary: 149 mg/dL — ABNORMAL HIGH (ref 70–99)
Glucose-Capillary: 176 mg/dL — ABNORMAL HIGH (ref 70–99)
Glucose-Capillary: 170 mg/dL — ABNORMAL HIGH (ref 70–99)
Glucose-Capillary: 204 mg/dL — ABNORMAL HIGH (ref 70–99)

## 2020-09-03 MED ORDER — MENTHOL 3 MG MT LOZG: 1.0000 | LOZENGE | OROMUCOSAL | Status: DC | PRN

## 2020-09-03 MED ORDER — PHENOL 1.4 % MT LIQD: 1.0000 | OROMUCOSAL | Status: DC | PRN

## 2020-09-03 NOTE — Progress Notes (Signed)
PROGRESS NOTE    Ashlee Greer   MVE:[id number]  DOB: 21-Oct-1932  DOA: 08/30/2020     3  PCP: [name], MD  CC: hypotension at home  Hospital Course: CHAUNICE OBIE is a 86 y.o. female with medical history significant of A. fib not on anticoagulation, anxiety/depression, arthritis, diabetes, glaucoma, hypertension, hypothyroidism who presented with recurrent loss of consciousness secondary to orthostatic hypotension.  Patient was recently discharged from [facility] after treatment for TIA, UTI, pneumonia.  She also had some fatigue and orthostasis while she was there.  She was discharged on Bactrim for UTI and Levaquin for her pneumonia.  She was recommended to go to an SNF at discharge but family is an Therapist, sports and stated they would care for patient at home.  However, her BP remained low prior to discharge which concerned family.  Due to ongoing ortho hypoTN on returning home, family brought patient to the hospital.    Workup in the ER was notable for temp 101.4, tachypnea, tachycardia. CXR showed left basilar infiltrate vs atelectasis.  Lactic was elevated and she was started on IVF and cefepime. Covid and flu swab negative.  She continued to improve with antibiotics. She was evaluated by physical therapy and recommended for SNF rehab at discharge.  This was discussed with her family who was also in agreement.   Interval History:  States that her breathing/cough is improving each day.  Informed her of a chronic fracture noted in her right wrist on x-rays yesterday.  She endorses ongoing chronic pain in her wrist from this.  Understands plan is for pursuing rehab for discharge.  Old records reviewed in assessment of this patient  ROS: Constitutional: negative for chills, Respiratory: positive for cough, Cardiovascular: negative for chest pain and Gastrointestinal: negative for abdominal pain  Assessment & Plan: * Severe sepsis ([place]) - source suspected pulm at this time  given subtle LLE opacity; possibility of aspiration if syncopizing as well - PCT 0.68>>0.79>>0.5 - now clinically improved on IV abx; okay to change to Azithro (continue flagyl) and will complete course (d/c cefepime) - follow up cultures: negative to date  Orthostatic hypotension-resolved as of 09/02/2020 -Also possibly multifactorial in setting of infection as well as polypharmacy.  Her medications reviewed and she is on sedating medications including clonazepam as well as anticholinergic mechanisms with nortriptyline which will be held; I have also counseled her bedside that her clonazepam dose needs to be reduced at discharge and ideally would benefit from weaning off of it altogether -resume celexa at lower dose, 20 mg daily on 12/25 -Continue monitoring blood pressure which is improving -Repeat orthostatic blood pressure: negative on 12/24  History of TIA (transient ischemic attack) -Continue aspirin for now -Obtaining records from [facility] and depression -See orthostatic hypotension -Clonazepam dose significantly reduced and holding nortriptyline -Celexa on hold but will try to resume soon  Unspecified atrial fibrillation ([place]) -Still in A. fib with.  Unclear discussion about anticoagulation.  Obtaining records from [place] for further clarification -Continue Coreg for now  Acute lower UTI -She does endorse some symptoms.  UA not very impressive however (neg nitrite, neg LE, 0-5 WBC, and only rare bacteria) - regardless treatment for PNA will cover urine most likely   Hypothyroidism -Continue Synthroid  Diabetes (HCC) -Continue SSI and CBG monitoring   Antimicrobials: Cefepime 12/22>>12/25 Flagyl 12/23>>current Azithromycin 09/02/2020>>current  DVT prophylaxis: Lovenox Code Status: Partial, Intubate only  Family Communication:  Disposition Plan: Status is: Inpatient  Remains inpatient  appropriate because:Ongoing diagnostic testing needed not appropriate  for outpatient work up, Unsafe d/c plan, IV treatments appropriate due to intensity of illness or inability to take PO and Inpatient level of care appropriate due to severity of illness   Dispo: The patient is from: Home              Anticipated d/c is to: Home              Anticipated d/c date is: 1 day              Patient currently is not medically stable to d/c.  Objective: Blood pressure (!) 156/109, pulse 98, temperature 98.1 F (36.7 C), temperature source Oral, resp. rate 20, height 5\' 1"  (1.549 m), weight 88.5 kg, SpO2 97 %.  Examination: General appearance: alert, cooperative and no distress Head: Normocephalic, without obvious abnormality, atraumatic Eyes: EOMI Lungs: mildly coarse BS, no wheezing Heart: irregularly irregular rhythm and S1, S2 normal Abdomen: normal findings: bowel sounds normal Extremities: no edema; TTP in right wrist Skin: mobility and turgor normal  Psych: odd affect Neurologic: Grossly normal, just very weak/deconditioned   Consultants:   n/a  Procedures:   n/a  Data Reviewed: I have personally reviewed following labs and imaging studies Results for orders placed or performed during the hospital encounter of 08/30/20 (from the past 24 hour(s))  Glucose, capillary     Status: Abnormal   Collection Time: 09/02/20  5:06 PM  Result Value Ref Range   Glucose-Capillary 137 (H) 70 - 99 mg/dL  Glucose, capillary     Status: Abnormal   Collection Time: 09/02/20  7:27 PM  Result Value Ref Range   Glucose-Capillary 226 (H) 70 - 99 mg/dL  Basic metabolic panel     Status: Abnormal   Collection Time: 09/03/20  4:36 AM  Result Value Ref Range   Sodium 136 135 - 145 mmol/L   Potassium 4.5 3.5 - 5.1 mmol/L   Chloride 103 98 - 111 mmol/L   CO2 25 22 - 32 mmol/L   Glucose, Bld 143 (H) 70 - 99 mg/dL   BUN 15 8 - 23 mg/dL   Creatinine, Ser 0.77 0.44 - 1.00 mg/dL   Calcium 8.4 (L) 8.9 - 10.3 mg/dL   GFR, Estimated >60 >60 mL/min   Anion gap 8 5 - 15   CBC with Differential/Platelet     Status: Abnormal   Collection Time: 09/03/20  4:36 AM  Result Value Ref Range   WBC 9.7 4.0 - 10.5 K/uL   RBC 3.70 (L) 3.87 - 5.11 MIL/uL   Hemoglobin 11.2 (L) 12.0 - 15.0 g/dL   HCT 34.2 (L) 36.0 - 46.0 %   MCV 92.4 80.0 - 100.0 fL   MCH 30.3 26.0 - 34.0 pg   MCHC 32.7 30.0 - 36.0 g/dL   RDW 14.8 11.5 - 15.5 %   Platelets 230 150 - 400 K/uL   nRBC 0.0 0.0 - 0.2 %   Neutrophils Relative % 71 %   Neutro Abs 6.9 1.7 - 7.7 K/uL   Lymphocytes Relative 16 %   Lymphs Abs 1.6 0.7 - 4.0 K/uL   Monocytes Relative 11 %   Monocytes Absolute 1.0 0.1 - 1.0 K/uL   Eosinophils Relative 1 %   Eosinophils Absolute 0.1 0.0 - 0.5 K/uL   Basophils Relative 0 %   Basophils Absolute 0.0 0.0 - 0.1 K/uL   Immature Granulocytes 1 %   Abs Immature Granulocytes 0.10 (H) 0.00 -  0.07 K/uL  Magnesium     Status: None   Collection Time: 09/03/20  4:36 AM  Result Value Ref Range   Magnesium 2.0 1.7 - 2.4 mg/dL  Glucose, capillary     Status: Abnormal   Collection Time: 09/03/20  8:16 AM  Result Value Ref Range   Glucose-Capillary 149 (H) 70 - 99 mg/dL  Glucose, capillary     Status: Abnormal   Collection Time: 09/03/20 11:56 AM  Result Value Ref Range   Glucose-Capillary 176 (H) 70 - 99 mg/dL    Recent Results (from the past 240 hour(s))  Blood culture (routine single)     Status: None (Preliminary result)   Collection Time: 08/30/20  6:33 PM   Specimen: BLOOD  Result Value Ref Range Status   Specimen Description BLOOD BLOOD RIGHT FOREARM  Final   Special Requests   Final    BOTTLES DRAWN AEROBIC ONLY Blood Culture results may not be optimal due to an inadequate volume of blood received in culture bottles   Culture   Final    NO GROWTH 3 DAYS Performed at [facility], [place] [address], [place], [place] [address]    Report Status PENDING  Incomplete  Resp Panel by RT-PCR (Flu A&B, Covid) Nasopharyngeal Swab     Status: None   Collection Time: 08/30/20   6:33 PM   Specimen: Nasopharyngeal Swab; Nasopharyngeal(NP) swabs in vial transport medium  Result Value Ref Range Status   SARS Coronavirus 2 by RT PCR NEGATIVE NEGATIVE Final    Comment: (NOTE) SARS-CoV-2 target nucleic acids are NOT DETECTED.  The SARS-CoV-2 RNA is generally detectable in upper respiratory specimens during the acute phase of infection. The lowest concentration of SARS-CoV-2 viral copies this assay can detect is 138 copies/mL. A negative result does not preclude SARS-Cov-2 infection and should not be used as the sole basis for treatment or other patient management decisions. A negative result may occur with  improper specimen collection/handling, submission of specimen other than nasopharyngeal swab, presence of viral mutation(s) within the areas targeted by this assay, and inadequate number of viral copies(<138 copies/mL). A negative result must be combined with clinical observations, patient history, and epidemiological information. The expected result is Negative.  Fact Sheet for Patients:  EntrepreneurPulse.com.au  Fact Sheet for Healthcare Providers:  IncredibleEmployment.be  This test is no t yet approved or cleared by the [place] FDA and  has been authorized for detection and/or diagnosis of SARS-CoV-2 by FDA under an Emergency Use Authorization (EUA). This EUA will remain  in effect (meaning this test can be used) for the duration of the COVID-19 declaration under Section 564(b)(1) of the Act, 21 U.S.C.section 360bbb-3(b)(1), unless the authorization is terminated  or revoked sooner.       Influenza A by PCR NEGATIVE NEGATIVE Final   Influenza B by PCR NEGATIVE NEGATIVE Final    Comment: (NOTE) The Xpert Xpress SARS-CoV-2/FLU/RSV plus assay is intended as an aid in the diagnosis of influenza from Nasopharyngeal swab specimens and should not be used as a sole basis for treatment. Nasal washings and aspirates  are unacceptable for Xpert Xpress SARS-CoV-2/FLU/RSV testing.  Fact Sheet for Patients: EntrepreneurPulse.com.au  Fact Sheet for Healthcare Providers: IncredibleEmployment.be  This test is not yet approved or cleared by the [place] FDA and has been authorized for detection and/or diagnosis of SARS-CoV-2 by FDA under an Emergency Use Authorization (EUA). This EUA will remain in effect (meaning this test can be used) for the duration  of the COVID-19 declaration under Section 564(b)(1) of the Act, 21 U.S.C. section 360bbb-3(b)(1), unless the authorization is terminated or revoked.  Performed at [facility], [place] [address] [place], [place] [address]   Culture, blood (single)     Status: None (Preliminary result)   Collection Time: 08/30/20  8:25 PM   Specimen: BLOOD RIGHT HAND  Result Value Ref Range Status   Specimen Description BLOOD RIGHT HAND  Final   Special Requests   Final    BOTTLES DRAWN AEROBIC AND ANAEROBIC Blood Culture results may not be optimal due to an inadequate volume of blood received in culture bottles   Culture   Final    NO GROWTH 3 DAYS Performed at [facility], [place] [address], [place], [place] [address]    Report Status PENDING  Incomplete  MRSA PCR Screening     Status: None   Collection Time: 08/31/20  1:07 AM   Specimen: Nasal Mucosa; Nasopharyngeal  Result Value Ref Range Status   MRSA by PCR NEGATIVE NEGATIVE Final    Comment:        The GeneXpert MRSA Assay (FDA approved for NASAL specimens only), is one component of a comprehensive MRSA colonization surveillance program. It is not intended to diagnose MRSA infection nor to guide or monitor treatment for MRSA infections. Performed at [facility], [place] [address], [place], [place] [address]   Respiratory Panel by PCR     Status: None   Collection Time: 09/01/20  1:48 PM   Specimen: Nasopharyngeal Swab; Respiratory  Result Value  Ref Range Status   Adenovirus NOT DETECTED NOT DETECTED Final   Coronavirus 229E NOT DETECTED NOT DETECTED Final    Comment: (NOTE) The Coronavirus on the Respiratory Panel, DOES NOT test for the novel  Coronavirus (2019 nCoV)    Coronavirus HKU1 NOT DETECTED NOT DETECTED Final   Coronavirus NL63 NOT DETECTED NOT DETECTED Final   Coronavirus OC43 NOT DETECTED NOT DETECTED Final   Metapneumovirus NOT DETECTED NOT DETECTED Final   Rhinovirus / Enterovirus NOT DETECTED NOT DETECTED Final   Influenza A NOT DETECTED NOT DETECTED Final   Influenza B NOT DETECTED NOT DETECTED Final   Parainfluenza Virus 1 NOT DETECTED NOT DETECTED Final   Parainfluenza Virus 2 NOT DETECTED NOT DETECTED Final   Parainfluenza Virus 3 NOT DETECTED NOT DETECTED Final   Parainfluenza Virus 4 NOT DETECTED NOT DETECTED Final   Respiratory Syncytial Virus NOT DETECTED NOT DETECTED Final   Bordetella pertussis NOT DETECTED NOT DETECTED Final   Bordetella Parapertussis NOT DETECTED NOT DETECTED Final   Chlamydophila pneumoniae NOT DETECTED NOT DETECTED Final   Mycoplasma pneumoniae NOT DETECTED NOT DETECTED Final    Comment: Performed at [facility], [place]. [address] [place], [place] [address]     Radiology Studies: DG Wrist Complete Right  Result Date: 09/02/2020 CLINICAL DATA:  Medial right wrist pain medial wrist pain, initial encounter EXAM: RIGHT WRIST - COMPLETE 3+ VIEW COMPARISON:  None. FINDINGS: Radiocarpal degenerative changes are identified. No acute fracture is seen. Degenerative changes of the first [facility] joint are seen as well. Mild generalized soft tissue swelling is seen. No acute fracture is noted. 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 IMPRESSION: Degenerative changes and prior traumatic changes without acute abnormality Electronically Signed   By: Inez Catalina M.D.   On: 09/02/2020 16:32   DG Hand Complete Right  Result Date: 09/02/2020 CLINICAL DATA:  Medial  wrist  pain, fall 1 week ago, initial encounter EXAM: RIGHT HAND - COMPLETE 3+ VIEW COMPARISON:  None. FINDINGS: Degenerative changes are noted at the radiocarpal joint the first [facility] joint. Interphalangeal degenerative changes are noted as well. Widening of the scapholunate space is noted better visualized than on the prior wrist films likely related to prior trauma. No acute fracture or dislocation is seen. Mild soft tissue swelling is noted about the wrist. 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 IMPRESSION: Degenerative and posttraumatic changes as described. No acute abnormality noted. Electronically Signed   By: Inez Catalina M.D.   On: 09/02/2020 16:36   DG Hand Complete Right  Final Result    DG Wrist Complete Right  Final Result    DG Chest Port 1 View  Final Result      Scheduled Meds: . aspirin  325 mg Oral QHS  . azithromycin  500 mg Oral Daily  . carvedilol  3.125 mg Oral BID WC  . citalopram  20 mg Oral QHS  . clonazePAM  0.5 mg Oral QHS  . donepezil  10 mg Oral QHS  . enoxaparin (LOVENOX) injection  40 mg Subcutaneous Q24H  . insulin aspart  0-15 Units Subcutaneous TID WC  . latanoprost  1 drop Both Eyes QHS  . levothyroxine  50 mcg Oral QAC breakfast  . metroNIDAZOLE  500 mg Oral Q8H  . pantoprazole  40 mg Oral Daily  . sodium chloride flush  3 mL Intravenous Q12H   PRN Meds: acetaminophen **OR** acetaminophen, menthol-cetylpyridinium, phenol, polyethylene glycol Continuous Infusions:    LOS: 3 days  Time spent: Greater than 50% of the 35 minute visit was spent in counseling/coordination of care for the patient as laid out in the A&P.   Dwyane Dee, MD [facility] 09/03/2020, 12:50 PM

## 2020-09-04 DIAGNOSIS — I480 Paroxysmal atrial fibrillation: Secondary | ICD-10-CM | POA: Diagnosis not present

## 2020-09-04 DIAGNOSIS — R6520 Severe sepsis without septic shock: Secondary | ICD-10-CM | POA: Diagnosis not present

## 2020-09-04 DIAGNOSIS — A419 Sepsis, unspecified organism: Secondary | ICD-10-CM | POA: Diagnosis not present

## 2020-09-04 LAB — BASIC METABOLIC PANEL
Anion gap: 12 (ref 5–15)
BUN: 12 mg/dL (ref 8–23)
CO2: 24 mmol/L (ref 22–32)
Calcium: 8.5 mg/dL — ABNORMAL LOW (ref 8.9–10.3)
Chloride: 98 mmol/L (ref 98–111)
Creatinine, Ser: 0.71 mg/dL (ref 0.44–1.00)
GFR, Estimated: >60 mL/min (ref >60)
Glucose, Bld: 132 mg/dL — ABNORMAL HIGH (ref 70–99)
Potassium: 4.8 mmol/L (ref 3.5–5.1)
Sodium: 134 mmol/L — ABNORMAL LOW (ref 135–145)

## 2020-09-04 LAB — GLUCOSE, CAPILLARY
Glucose-Capillary: 141 mg/dL — ABNORMAL HIGH (ref 70–99)
Glucose-Capillary: 167 mg/dL — ABNORMAL HIGH (ref 70–99)
Glucose-Capillary: 202 mg/dL — ABNORMAL HIGH (ref 70–99)
Glucose-Capillary: 106 mg/dL — ABNORMAL HIGH (ref 70–99)
Glucose-Capillary: 177 mg/dL — ABNORMAL HIGH (ref 70–99)

## 2020-09-04 LAB — CULTURE, BLOOD (SINGLE)
Culture: NO GROWTH
Culture: NO GROWTH

## 2020-09-04 LAB — CBC WITH DIFFERENTIAL/PLATELET
Abs Immature Granulocytes: 0.15 10*3/uL — ABNORMAL HIGH (ref 0.00–0.07)
Basophils Absolute: 0.1 10*3/uL (ref 0.0–0.1)
Basophils Relative: 1 %
Eosinophils Absolute: 0.1 10*3/uL (ref 0.0–0.5)
Eosinophils Relative: 1 %
HCT: 32.5 % — ABNORMAL LOW (ref 36.0–46.0)
Hemoglobin: 11 g/dL — ABNORMAL LOW (ref 12.0–15.0)
Immature Granulocytes: 1 %
Lymphocytes Relative: 14 %
Lymphs Abs: 1.5 10*3/uL (ref 0.7–4.0)
MCH: 30.8 pg (ref 26.0–34.0)
MCHC: 33.8 g/dL (ref 30.0–36.0)
MCV: 91 fL (ref 80.0–100.0)
Monocytes Absolute: 1 10*3/uL (ref 0.1–1.0)
Monocytes Relative: 10 %
Neutro Abs: 7.9 10*3/uL — ABNORMAL HIGH (ref 1.7–7.7)
Neutrophils Relative %: 73 %
Platelets: 269 10*3/uL (ref 150–400)
RBC: 3.57 MIL/uL — ABNORMAL LOW (ref 3.87–5.11)
RDW: 14.8 % (ref 11.5–15.5)
WBC: 10.7 10*3/uL — ABNORMAL HIGH (ref 4.0–10.5)
nRBC: 0 % (ref 0.0–0.2)

## 2020-09-04 LAB — MAGNESIUM: Magnesium: 1.9 mg/dL (ref 1.7–2.4)

## 2020-09-04 MED ORDER — RIVAROXABAN 20 MG PO TABS
20.0000 mg | ORAL_TABLET | Freq: Every day | ORAL | Status: DC
  Administered 2020-09-04 – 2020-09-05 (×2): 20 mg via ORAL
  Filled 2020-09-04 (×2): qty 1

## 2020-09-04 NOTE — Progress Notes (Signed)
PROGRESS NOTE    Ashlee Greer   LAG:[id number]  DOB: 1933/06/25  DOA: 08/30/2020     4  PCP: [name], MD  CC: hypotension at home  Hospital Course: Ashlee Greer is a 86 y.o. female with medical history significant of A. fib not on anticoagulation, anxiety/depression, arthritis, diabetes, glaucoma, hypertension, hypothyroidism who presented with recurrent loss of consciousness secondary to orthostatic hypotension.  Patient was recently discharged from [facility] after treatment for TIA, UTI, pneumonia.  She also had some fatigue and orthostasis while she was there.  She was discharged on Bactrim for UTI and Levaquin for her pneumonia.  She was recommended to go to an SNF at discharge but family is an Therapist, sports and stated they would care for patient at home.  However, her BP remained low prior to discharge which concerned family.  Due to ongoing ortho hypoTN on returning home, family brought patient to the hospital.    Workup in the ER was notable for temp 101.4, tachypnea, tachycardia. CXR showed left basilar infiltrate vs atelectasis.  Lactic was elevated and she was started on IVF and cefepime. Covid and flu swab negative.  She continued to improve with antibiotics. She was evaluated by physical therapy and recommended for SNF rehab at discharge.  This was discussed with her family who was also in agreement.   Interval History:  No events overnight.  Urine symptoms have resolved and respiratory symptoms have been improving steadily.  We discussed anticoagulation, she is amenable for trial of Xarelto since Eliquis made her confused when last tried. Understands we are also awaiting rehab bed placement.  Old records reviewed in assessment of this patient  ROS: Constitutional: negative for chills, Respiratory: positive for cough, Cardiovascular: negative for chest pain and Gastrointestinal: negative for abdominal pain  Assessment & Plan: * Severe sepsis (HCC)-resolved as  of 09/04/2020 - source suspected pulm at this time given subtle LLE opacity; possibility of aspiration if syncopizing as well - PCT 0.68>>0.79>>0.5 - now clinically improved on IV abx; okay to change to Azithro (continue flagyl) and will complete course (d/c cefepime) - follow up cultures: negative to date - finishing out 7 day course total   Orthostatic hypotension-resolved as of 09/02/2020 -Also possibly multifactorial in setting of infection as well as polypharmacy.  Her medications reviewed and she is on sedating medications including clonazepam as well as anticholinergic mechanisms with nortriptyline which will be held; I have also counseled her bedside that her clonazepam dose needs to be reduced at discharge and ideally would benefit from weaning off of it altogether -resume celexa at lower dose, 20 mg daily on 12/25 -Continue monitoring blood pressure which is improving -Repeat orthostatic blood pressure: negative on 12/24  History of TIA (transient ischemic attack) - see afib - never received records from [facility] and depression -See orthostatic hypotension -Clonazepam dose significantly reduced and holding nortriptyline -Celexa on hold but will try to resume soon  PAF (paroxysmal atrial fibrillation) ([place]) -Discussed bedside with patient; Eliquis made her confused in the past.  Denies any significant bleeding events.  She is open to alternative anticoagulation -Discussed that we would trial Xarelto -Continue Coreg  Acute lower UTI-resolved as of 09/04/2020 -She does endorse some symptoms.  UA not very impressive however (neg nitrite, neg LE, 0-5 WBC, and only rare bacteria) - regardless treatment for PNA will cover urine most likely   Hypothyroidism -Continue Synthroid  Diabetes (HCC) -Continue SSI and CBG monitoring   Antimicrobials: Cefepime  12/22>>12/25 Flagyl 12/23>>current Azithromycin 09/02/2020>>current  DVT prophylaxis: Xarelto Code Status:  Partial, Intubate only  Family Communication:  Disposition Plan: Status is: Inpatient  Remains inpatient appropriate because:Ongoing diagnostic testing needed not appropriate for outpatient work up, Unsafe d/c plan, IV treatments appropriate due to intensity of illness or inability to take PO and Inpatient level of care appropriate due to severity of illness   Dispo: The patient is from: Home              Anticipated d/c is to: Home              Anticipated d/c date is: 1 day              Patient currently is not medically stable to d/c.  Objective: Blood pressure (!) 155/106, pulse 93, temperature 98.6 F (37 C), temperature source Oral, resp. rate 20, height 5\' 1"  (1.549 m), weight 88.5 kg, SpO2 95 %.  Examination: General appearance: alert, cooperative and no distress Head: Normocephalic, without obvious abnormality, atraumatic Eyes: EOMI Lungs: mildly coarse BS, no wheezing Heart: irregularly irregular rhythm and S1, S2 normal Abdomen: normal findings: bowel sounds normal Extremities: no edema; TTP in right wrist Skin: mobility and turgor normal  Psych: odd affect Neurologic: Grossly normal, just very weak/deconditioned   Consultants:   n/a  Procedures:   n/a  Data Reviewed: I have personally reviewed following labs and imaging studies Results for orders placed or performed during the hospital encounter of 08/30/20 (from the past 24 hour(s))  Glucose, capillary     Status: Abnormal   Collection Time: 09/03/20  6:10 PM  Result Value Ref Range   Glucose-Capillary 170 (H) 70 - 99 mg/dL  Glucose, capillary     Status: Abnormal   Collection Time: 09/03/20  8:46 PM  Result Value Ref Range   Glucose-Capillary 204 (H) 70 - 99 mg/dL  Basic metabolic panel     Status: Abnormal   Collection Time: 09/04/20  6:09 AM  Result Value Ref Range   Sodium 134 (L) 135 - 145 mmol/L   Potassium 4.8 3.5 - 5.1 mmol/L   Chloride 98 98 - 111 mmol/L   CO2 24 22 - 32 mmol/L   Glucose, Bld  132 (H) 70 - 99 mg/dL   BUN 12 8 - 23 mg/dL   Creatinine, Ser 0.71 0.44 - 1.00 mg/dL   Calcium 8.5 (L) 8.9 - 10.3 mg/dL   GFR, Estimated >60 >60 mL/min   Anion gap 12 5 - 15  CBC with Differential/Platelet     Status: Abnormal   Collection Time: 09/04/20  6:09 AM  Result Value Ref Range   WBC 10.7 (H) 4.0 - 10.5 K/uL   RBC 3.57 (L) 3.87 - 5.11 MIL/uL   Hemoglobin 11.0 (L) 12.0 - 15.0 g/dL   HCT 32.5 (L) 36.0 - 46.0 %   MCV 91.0 80.0 - 100.0 fL   MCH 30.8 26.0 - 34.0 pg   MCHC 33.8 30.0 - 36.0 g/dL   RDW 14.8 11.5 - 15.5 %   Platelets 269 150 - 400 K/uL   nRBC 0.0 0.0 - 0.2 %   Neutrophils Relative % 73 %   Neutro Abs 7.9 (H) 1.7 - 7.7 K/uL   Lymphocytes Relative 14 %   Lymphs Abs 1.5 0.7 - 4.0 K/uL   Monocytes Relative 10 %   Monocytes Absolute 1.0 0.1 - 1.0 K/uL   Eosinophils Relative 1 %   Eosinophils Absolute 0.1 0.0 - 0.5 K/uL  Basophils Relative 1 %   Basophils Absolute 0.1 0.0 - 0.1 K/uL   Immature Granulocytes 1 %   Abs Immature Granulocytes 0.15 (H) 0.00 - 0.07 K/uL  Magnesium     Status: None   Collection Time: 09/04/20  6:09 AM  Result Value Ref Range   Magnesium 1.9 1.7 - 2.4 mg/dL  Glucose, capillary     Status: Abnormal   Collection Time: 09/04/20  7:35 AM  Result Value Ref Range   Glucose-Capillary 141 (H) 70 - 99 mg/dL  Glucose, capillary     Status: Abnormal   Collection Time: 09/04/20 11:14 AM  Result Value Ref Range   Glucose-Capillary 167 (H) 70 - 99 mg/dL  Glucose, capillary     Status: Abnormal   Collection Time: 09/04/20  1:49 PM  Result Value Ref Range   Glucose-Capillary 202 (H) 70 - 99 mg/dL   Comment 1 Notify RN    Comment 2 Document in Chart     Recent Results (from the past 240 hour(s))  Blood culture (routine single)     Status: None   Collection Time: 08/30/20  6:33 PM   Specimen: BLOOD  Result Value Ref Range Status   Specimen Description BLOOD BLOOD RIGHT FOREARM  Final   Special Requests   Final    BOTTLES DRAWN AEROBIC ONLY  Blood Culture results may not be optimal due to an inadequate volume of blood received in culture bottles   Culture   Final    NO GROWTH 5 DAYS Performed at [facility], [place] [address] [place], [place] [address]    Report Status 09/04/2020 FINAL  Final  Resp Panel by RT-PCR (Flu A&B, Covid) Nasopharyngeal Swab     Status: None   Collection Time: 08/30/20  6:33 PM   Specimen: Nasopharyngeal Swab; Nasopharyngeal(NP) swabs in vial transport medium  Result Value Ref Range Status   SARS Coronavirus 2 by RT PCR NEGATIVE NEGATIVE Final    Comment: (NOTE) SARS-CoV-2 target nucleic acids are NOT DETECTED.  The SARS-CoV-2 RNA is generally detectable in upper respiratory specimens during the acute phase of infection. The lowest concentration of SARS-CoV-2 viral copies this assay can detect is 138 copies/mL. A negative result does not preclude SARS-Cov-2 infection and should not be used as the sole basis for treatment or other patient management decisions. A negative result may occur with  improper specimen collection/handling, submission of specimen other than nasopharyngeal swab, presence of viral mutation(s) within the areas targeted by this assay, and inadequate number of viral copies(<138 copies/mL). A negative result must be combined with clinical observations, patient history, and epidemiological information. The expected result is Negative.  Fact Sheet for Patients:  EntrepreneurPulse.com.au  Fact Sheet for Healthcare Providers:  IncredibleEmployment.be  This test is no t yet approved or cleared by the [place] FDA and  has been authorized for detection and/or diagnosis of SARS-CoV-2 by FDA under an Emergency Use Authorization (EUA). This EUA will remain  in effect (meaning this test can be used) for the duration of the COVID-19 declaration under Section 564(b)(1) of the Act, 21 U.S.C.section 360bbb-3(b)(1), unless the authorization is  terminated  or revoked sooner.       Influenza A by PCR NEGATIVE NEGATIVE Final   Influenza B by PCR NEGATIVE NEGATIVE Final    Comment: (NOTE) The Xpert Xpress SARS-CoV-2/FLU/RSV plus assay is intended as an aid in the diagnosis of influenza from Nasopharyngeal swab specimens and should not be used as a sole basis  for treatment. Nasal washings and aspirates are unacceptable for Xpert Xpress SARS-CoV-2/FLU/RSV testing.  Fact Sheet for Patients: EntrepreneurPulse.com.au  Fact Sheet for Healthcare Providers: IncredibleEmployment.be  This test is not yet approved or cleared by the [place] FDA and has been authorized for detection and/or diagnosis of SARS-CoV-2 by FDA under an Emergency Use Authorization (EUA). This EUA will remain in effect (meaning this test can be used) for the duration of the COVID-19 declaration under Section 564(b)(1) of the Act, 21 U.S.C. section 360bbb-3(b)(1), unless the authorization is terminated or revoked.  Performed at [facility], [place] [address] [place], [place] [address]   Culture, blood (single)     Status: None   Collection Time: 08/30/20  8:25 PM   Specimen: BLOOD RIGHT HAND  Result Value Ref Range Status   Specimen Description BLOOD RIGHT HAND  Final   Special Requests   Final    BOTTLES DRAWN AEROBIC AND ANAEROBIC Blood Culture results may not be optimal due to an inadequate volume of blood received in culture bottles   Culture   Final    NO GROWTH 5 DAYS Performed at [facility], [place] [address], [place], [place] [address]    Report Status 09/04/2020 FINAL  Final  MRSA PCR Screening     Status: None   Collection Time: 08/31/20  1:07 AM   Specimen: Nasal Mucosa; Nasopharyngeal  Result Value Ref Range Status   MRSA by PCR NEGATIVE NEGATIVE Final    Comment:        The GeneXpert MRSA Assay (FDA approved for NASAL specimens only), is one component of a comprehensive MRSA  colonization surveillance program. It is not intended to diagnose MRSA infection nor to guide or monitor treatment for MRSA infections. Performed at [facility], [place] [address], [place], [place] [address]   Respiratory Panel by PCR     Status: None   Collection Time: 09/01/20  1:48 PM   Specimen: Nasopharyngeal Swab; Respiratory  Result Value Ref Range Status   Adenovirus NOT DETECTED NOT DETECTED Final   Coronavirus 229E NOT DETECTED NOT DETECTED Final    Comment: (NOTE) The Coronavirus on the Respiratory Panel, DOES NOT test for the novel  Coronavirus (2019 nCoV)    Coronavirus HKU1 NOT DETECTED NOT DETECTED Final   Coronavirus NL63 NOT DETECTED NOT DETECTED Final   Coronavirus OC43 NOT DETECTED NOT DETECTED Final   Metapneumovirus NOT DETECTED NOT DETECTED Final   Rhinovirus / Enterovirus NOT DETECTED NOT DETECTED Final   Influenza A NOT DETECTED NOT DETECTED Final   Influenza B NOT DETECTED NOT DETECTED Final   Parainfluenza Virus 1 NOT DETECTED NOT DETECTED Final   Parainfluenza Virus 2 NOT DETECTED NOT DETECTED Final   Parainfluenza Virus 3 NOT DETECTED NOT DETECTED Final   Parainfluenza Virus 4 NOT DETECTED NOT DETECTED Final   Respiratory Syncytial Virus NOT DETECTED NOT DETECTED Final   Bordetella pertussis NOT DETECTED NOT DETECTED Final   Bordetella Parapertussis NOT DETECTED NOT DETECTED Final   Chlamydophila pneumoniae NOT DETECTED NOT DETECTED Final   Mycoplasma pneumoniae NOT DETECTED NOT DETECTED Final    Comment: Performed at [facility], [place]. [address] [place], [place] [address]     Radiology Studies: DG Wrist Complete Right  Result Date: 09/02/2020 CLINICAL DATA:  Medial right wrist pain medial wrist pain, initial encounter EXAM: RIGHT WRIST - COMPLETE 3+ VIEW COMPARISON:  None. FINDINGS: Radiocarpal degenerative changes are identified. No acute fracture is seen.  Degenerative changes of the first [facility] joint are seen as well. Mild generalized  soft tissue swelling is seen. No acute fracture is noted. 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 IMPRESSION: Degenerative changes and prior traumatic changes without acute abnormality Electronically Signed   By: Inez Catalina M.D.   On: 09/02/2020 16:32   DG Hand Complete Right  Result Date: 09/02/2020 CLINICAL DATA:  Medial wrist pain, fall 1 week ago, initial encounter EXAM: RIGHT HAND - COMPLETE 3+ VIEW COMPARISON:  None. FINDINGS: Degenerative changes are noted at the radiocarpal joint the first [facility] joint. Interphalangeal degenerative changes are noted as well. Widening of the scapholunate space is noted better visualized than on the prior wrist films likely related to prior trauma. No acute fracture or dislocation is seen. Mild soft tissue swelling is noted about the wrist. 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 IMPRESSION: Degenerative and posttraumatic changes as described. No acute abnormality noted. Electronically Signed   By: Inez Catalina M.D.   On: 09/02/2020 16:36   DG Hand Complete Right  Final Result    DG Wrist Complete Right  Final Result    DG Chest Port 1 View  Final Result      Scheduled Meds: . azithromycin  500 mg Oral Daily  . carvedilol  3.125 mg Oral BID WC  . citalopram  20 mg Oral QHS  . clonazePAM  0.5 mg Oral QHS  . donepezil  10 mg Oral QHS  . insulin aspart  0-15 Units Subcutaneous TID WC  . latanoprost  1 drop Both Eyes QHS  . levothyroxine  50 mcg Oral QAC breakfast  . metroNIDAZOLE  500 mg Oral Q8H  . pantoprazole  40 mg Oral Daily  . rivaroxaban  20 mg Oral Q supper  . sodium chloride flush  3 mL Intravenous Q12H   PRN Meds: acetaminophen **OR** acetaminophen, menthol-cetylpyridinium, phenol, polyethylene glycol Continuous Infusions:    LOS: 4 days  Time spent: Greater than 50% of the 35 minute visit was spent in counseling/coordination of care for the patient as laid out in the A&P.   Dwyane Dee,  MD [facility] 09/04/2020, 3:28 PM

## 2020-09-04 NOTE — Progress Notes (Signed)
Physical Therapy Treatment Patient Details Name: Ashlee Greer MRN: [id number] DOB: [birth year] Today's Date: 09/04/2020    History of Present Illness Ashlee Greer is a 86 y.o. female with medical history significant of A. fib not on anticoagulation, anxiety/depression, arthritis, diabetes, glaucoma, hypertension, hypothyroidism who presents with recurrent loss of consciousness secondary to orthostatic hypotension. Patient was recently discharged from [facility] after treatment for TIA, UTI, pneumonia.  She also had some fatigue and orthostasis while she was there. She was recommended to go to an SNF at discharge but family is an Therapist, sports and thought they could handle her at home.  Family states that however protested her discharged and got an extra day because of this prior to discharge.  However, after returning home, her blood pressures consistently dropped on standing and has gotten so bad that she had episodes of syncope while working with PT.    PT Comments    Informed by patient's daughter of chronic fx found in R wrist, no WB orders found, treated within pain tolerance. Patient hesitant to utilize R UE due to information. Patient continues to gaze towards L side with heavy L lateral lean. Patient requires heavy assistance for bed mobility. Attempted orthostatics this session, however after obtaining sitting BP, patient refused to stand due to increased fatigue. Returned to supine, with patient agreeable to bed level exercises to strengthen B LE. Patient continues to be limited by decreased activity tolerance, impaired balance, R side neglect, generalized weakness, and impaired functional mobility. Continue to recommend SNF for ongoing Physical Therapy.      Follow Up Recommendations  SNF;Supervision/Assistance - 24 hour     Equipment Recommendations  Other (comment) (defer to post acute rehab)    Recommendations for Other Services OT consult     Precautions / Restrictions  Precautions Precautions: Fall Restrictions Weight Bearing Restrictions: No    Mobility  Bed Mobility Overal bed mobility: Needs Assistance Bed Mobility: Supine to Sit;Sit to Supine;Rolling Rolling: Max assist;+2 for physical assistance;+2 for safety/equipment   Supine to sit: Max assist;+2 for physical assistance;+2 for safety/equipment;HOB elevated Sit to supine: Total assist;+2 for physical assistance;+2 for safety/equipment      Transfers                 General transfer comment: refused to perform OOB mobility due to fatigue  Ambulation/Gait                 Stairs             Wheelchair Mobility    Modified Rankin (Stroke Patients Only)       Balance Overall balance assessment: Needs assistance Sitting-balance support: Single extremity supported;Feet supported Sitting balance-Leahy Scale: Poor Sitting balance - Comments: modA-min guard to maintain sitting EOB Postural control: Left lateral lean                                  Cognition Arousal/Alertness: Awake/alert Behavior During Therapy: WFL for tasks assessed/performed Overall Cognitive Status: Impaired/Different from baseline Area of Impairment: Memory;Following commands;Awareness;Problem solving                     Memory: Decreased short-term memory Following Commands: Follows one step commands with increased time;Follows multi-step commands inconsistently   Awareness: Emergent Problem Solving: Slow processing;Decreased initiation;Difficulty sequencing;Requires verbal cues;Requires tactile cues        Exercises General Exercises - Lower Extremity Ankle Circles/Pumps: AROM;Both;10  reps Heel Slides: AROM;Both;10 reps Hip ABduction/ADduction: AROM;Both;10 reps Straight Leg Raises: AROM;Both;10 reps    General Comments General comments (skin integrity, edema, etc.): Attempted to assess orthostatics, however after obtaining BP in sitting, patient refused  to stand this session due to fatigue      Pertinent Vitals/Pain Pain Assessment: Faces Faces Pain Scale: Hurts a little bit Pain Location: R hand Pain Descriptors / Indicators: Grimacing Pain Intervention(s): Monitored during session    Home Living                      Prior Function            PT Goals (current goals can now be found in the care plan section) Acute Rehab PT Goals Patient Stated Goal: to get stronger PT Goal Formulation: With patient/family Time For Goal Achievement: 09/15/20 Potential to Achieve Goals: Fair Progress towards PT goals: Progressing toward goals    Frequency    Min 2X/week      PT Plan Current plan remains appropriate    Co-evaluation              AM-PAC PT "6 Clicks" Mobility   Outcome Measure  Help needed turning from your back to your side while in a flat bed without using bedrails?: A Lot Help needed moving from lying on your back to sitting on the side of a flat bed without using bedrails?: A Lot Help needed moving to and from a bed to a chair (including a wheelchair)?: Total Help needed standing up from a chair using your arms (e.g., wheelchair or bedside chair)?: A Lot Help needed to walk in hospital room?: Total Help needed climbing 3-5 steps with a railing? : Total 6 Click Score: 9    End of Session   Activity Tolerance: Patient limited by fatigue Patient left: in bed;with call bell/phone within reach;with bed alarm set Nurse Communication: Mobility status PT Visit Diagnosis: Unsteadiness on feet (R26.81);Other abnormalities of gait and mobility (R26.89);Muscle weakness (generalized) (M62.81);Difficulty in walking, not elsewhere classified (R26.2)     Time: 5852-7782 PT Time Calculation (min) (ACUTE ONLY): 24 min  Charges:  $Therapeutic Activity: 23-37 mins                     Perrin Maltese, PT, DPT Acute Rehabilitation Services Pager [phone] Office [phone]    Melene Plan  Mcjunkins 09/04/2020, 3:50 PM

## 2020-09-04 NOTE — Care Management Important Message (Signed)
Important Message  Patient Details  Name: Ashlee Greer MRN: [id number] Date of Birth: 06-27-1933   Medicare Important Message Given:  Yes     [name] Montine Circle 09/04/2020, 2:43 PM

## 2020-09-05 DIAGNOSIS — M79641 Pain in right hand: Secondary | ICD-10-CM

## 2020-09-05 DIAGNOSIS — A419 Sepsis, unspecified organism: Secondary | ICD-10-CM | POA: Diagnosis not present

## 2020-09-05 DIAGNOSIS — R6520 Severe sepsis without septic shock: Secondary | ICD-10-CM | POA: Diagnosis not present

## 2020-09-05 LAB — CBC WITH DIFFERENTIAL/PLATELET
Abs Immature Granulocytes: 0.14 10*3/uL — ABNORMAL HIGH (ref 0.00–0.07)
Basophils Absolute: 0 10*3/uL (ref 0.0–0.1)
Basophils Relative: 0 %
Eosinophils Absolute: 0.1 10*3/uL (ref 0.0–0.5)
Eosinophils Relative: 1 %
HCT: 32.6 % — ABNORMAL LOW (ref 36.0–46.0)
Hemoglobin: 11.4 g/dL — ABNORMAL LOW (ref 12.0–15.0)
Immature Granulocytes: 1 %
Lymphocytes Relative: 15 %
Lymphs Abs: 1.5 10*3/uL (ref 0.7–4.0)
MCH: 31.6 pg (ref 26.0–34.0)
MCHC: 35 g/dL (ref 30.0–36.0)
MCV: 90.3 fL (ref 80.0–100.0)
Monocytes Absolute: 1.1 10*3/uL — ABNORMAL HIGH (ref 0.1–1.0)
Monocytes Relative: 10 %
Neutro Abs: 7.5 10*3/uL (ref 1.7–7.7)
Neutrophils Relative %: 73 %
Platelets: 334 10*3/uL (ref 150–400)
RBC: 3.61 MIL/uL — ABNORMAL LOW (ref 3.87–5.11)
RDW: 15.1 % (ref 11.5–15.5)
WBC: 10.3 10*3/uL (ref 4.0–10.5)
nRBC: 0.2 % (ref 0.0–0.2)

## 2020-09-05 LAB — BASIC METABOLIC PANEL
Anion gap: 11 (ref 5–15)
BUN: 12 mg/dL (ref 8–23)
CO2: 25 mmol/L (ref 22–32)
Calcium: 8.3 mg/dL — ABNORMAL LOW (ref 8.9–10.3)
Chloride: 95 mmol/L — ABNORMAL LOW (ref 98–111)
Creatinine, Ser: 0.71 mg/dL (ref 0.44–1.00)
GFR, Estimated: >60 mL/min (ref >60)
Glucose, Bld: 136 mg/dL — ABNORMAL HIGH (ref 70–99)
Potassium: 4.4 mmol/L (ref 3.5–5.1)
Sodium: 131 mmol/L — ABNORMAL LOW (ref 135–145)

## 2020-09-05 LAB — GLUCOSE, CAPILLARY
Glucose-Capillary: 139 mg/dL — ABNORMAL HIGH (ref 70–99)
Glucose-Capillary: 178 mg/dL — ABNORMAL HIGH (ref 70–99)
Glucose-Capillary: 156 mg/dL — ABNORMAL HIGH (ref 70–99)
Glucose-Capillary: 116 mg/dL — ABNORMAL HIGH (ref 70–99)

## 2020-09-05 LAB — RESP PANEL BY RT-PCR (FLU A&B, COVID) ARPGX2
Influenza A by PCR: NEGATIVE
Influenza B by PCR: NEGATIVE
SARS Coronavirus 2 by RT PCR: NEGATIVE

## 2020-09-05 LAB — MAGNESIUM: Magnesium: 2 mg/dL (ref 1.7–2.4)

## 2020-09-05 NOTE — TOC Progression Note (Signed)
Transition of Care [facility]) - Progression Note    Patient Details  Name: JELENE ALBANO MRN: [id number] Date of Birth: 06-16-1933  [facility] CM/SW [place], [place] Phone Number: 09/05/2020, 4:05 PM  Clinical Narrative:     CSW spoke to pts daughter Judson Roch about SNF options. CSW informed sarah that neither [facility] or [facility] are able to accept pt. Sarahs choices are 1. [facility], 2. [facility], and 3. [facility].  Judson Roch stated her sister has tested positive for covid and the pt was around her on Sunday. Judson Roch also stated that they are interested in pt getting the 2nd covid vaccine while in the hospital. MD notified.  TOC will follow.   Expected Discharge Plan: [place] Barriers to Discharge: Continued Medical Work up  Expected Discharge Plan and Services Expected Discharge Plan: [place]   Discharge Planning Services: CM Consult Post Acute Care Choice: Durable Medical Equipment Living arrangements for the past 2 months: Single Family Home                                       Social Determinants of Health (SDOH) Interventions    Readmission Risk Interventions No flowsheet data found.  Emeterio Reeve, Latanya Presser, [place] Social Worker [phone]

## 2020-09-05 NOTE — Assessment & Plan Note (Addendum)
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

## 2020-09-05 NOTE — Progress Notes (Signed)
PROGRESS NOTE    CARISMA TROUPE   GUR:[id number]  DOB: 08-Mar-1933  DOA: 08/30/2020     5  PCP: [name], MD  CC: hypotension at home  Hospital Course: FREYA ZOBRIST is a 86 y.o. female with medical history significant of A. fib not on anticoagulation, anxiety/depression, arthritis, diabetes, glaucoma, hypertension, hypothyroidism who presented with recurrent loss of consciousness secondary to orthostatic hypotension.  Patient was recently discharged from [facility] after treatment for TIA, UTI, pneumonia.  She also had some fatigue and orthostasis while she was there.  She was discharged on Bactrim for UTI and Levaquin for her pneumonia.  She was recommended to go to an SNF at discharge but family is an Therapist, sports and stated they would care for patient at home.  However, her BP remained low prior to discharge which concerned family.  Due to ongoing ortho hypoTN on returning home, family brought patient to the hospital.    Workup in the ER was notable for temp 101.4, tachypnea, tachycardia. CXR showed left basilar infiltrate vs atelectasis.  Lactic was elevated and she was started on IVF and cefepime. Covid and flu swab negative.  She continued to improve with antibiotics. She was evaluated by physical therapy and recommended for SNF rehab at discharge.  This was discussed with her family who was also in agreement.   Interval History:  No events overnight.  Feeling better each day. Affect remains odd and flat but she'll occasionally smile. Right hand pain is stable and chronic. She didn't stand well yesterday with physical therapy which was not surprising since she has not been out of bed at home for several weeks per family. Denies any dizziness/lightheadedness when she is sitting on edge of bed though.  Old records reviewed in assessment of this patient  ROS: Constitutional: negative for chills, Respiratory: positive for cough, Cardiovascular: negative for chest pain and  Gastrointestinal: negative for abdominal pain  Assessment & Plan: * Severe sepsis (HCC)-resolved as of 09/04/2020 - source suspected pulm at this time given subtle LLE opacity; possibility of aspiration if syncopizing as well - PCT 0.68>>0.79>>0.5 - now clinically improved on IV abx; okay to change to Azithro (continue flagyl) and will complete course (d/c cefepime) - follow up cultures: negative to date - finishing out 7 day course total   Orthostatic hypotension-resolved as of 09/02/2020 -Also possibly multifactorial in setting of infection as well as polypharmacy.  Her medications reviewed and she is on sedating medications including clonazepam as well as anticholinergic mechanisms with nortriptyline which will be held; I have also counseled her bedside that her clonazepam dose needs to be reduced at discharge and ideally would benefit from weaning off of it altogether -resume celexa at lower dose, 20 mg daily on 12/25 -Continue monitoring blood pressure which is improving -Repeat orthostatic blood pressure: negative on 12/24  History of TIA (transient ischemic attack) - see afib - never received records from [facility] and depression -See orthostatic hypotension -Clonazepam dose significantly reduced and holding nortriptyline -Celexa on hold but will try to resume soon  PAF (paroxysmal atrial fibrillation) ([place]) -Discussed bedside with patient; Eliquis made her confused in the past.  Denies any significant bleeding events.  She is open to alternative anticoagulation -Discussed that we would trial Xarelto (started 12/27) -Continue Coreg  Acute lower UTI-resolved as of 09/04/2020 -She does endorse some symptoms.  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 - ACE wrap ordered  Hypothyroidism -Continue Synthroid  Diabetes (HCC) -Continue SSI and CBG monitoring   Antimicrobials: Cefepime 12/22>>12/25 Flagyl 12/23>>current Azithromycin 09/02/2020>>current  DVT prophylaxis: Xarelto Code Status: Partial, Intubate only  Family Communication:  Disposition Plan: Status is: Inpatient  Remains inpatient appropriate because:Ongoing diagnostic testing needed not appropriate for outpatient work up, Unsafe d/c plan, IV treatments appropriate due to intensity of illness or inability to take PO and Inpatient level of care appropriate due to severity of illness   Dispo: The patient is from: Home              Anticipated d/c is to: SNF              Anticipated d/c date is: When bed available              Patient currently is medically stable to d/c.  Objective: Blood pressure (!) 147/89, pulse (!) 108, temperature 99.2 F (37.3 C), resp. rate 20, height 5\' 1"  (1.549 m), weight 88.5 kg, SpO2 91 %.  Examination: General appearance: alert, cooperative and no distress Head: Normocephalic, without obvious abnormality, atraumatic Eyes: EOMI Lungs: mildly coarse BS, no wheezing Heart: irregularly irregular rhythm and S1, S2 normal Abdomen: normal findings: bowel sounds normal Extremities: no edema; TTP in right wrist Skin: mobility and turgor normal  Psych: odd affect Neurologic: Grossly normal, just very weak/deconditioned   Consultants:   n/a  Procedures:   n/a  Data Reviewed: I have personally reviewed following labs and imaging studies Results for orders placed or performed during the hospital encounter of 08/30/20 (from the past 24 hour(s))  Glucose, capillary     Status: Abnormal   Collection Time: 09/04/20  1:49 PM  Result Value Ref Range   Glucose-Capillary 202 (H) 70 - 99 mg/dL   Comment 1 Notify RN    Comment 2 Document in Chart   Glucose, capillary     Status: Abnormal   Collection Time: 09/04/20  4:56 PM   Result Value Ref Range   Glucose-Capillary 106 (H) 70 - 99 mg/dL  Glucose, capillary     Status: Abnormal   Collection Time: 09/04/20  8:54 PM  Result Value Ref Range   Glucose-Capillary 177 (H) 70 - 99 mg/dL  CBC with Differential/Platelet     Status: Abnormal   Collection Time: 09/05/20 12:56 AM  Result Value Ref Range   WBC 10.3 4.0 - 10.5 K/uL   RBC 3.61 (L) 3.87 - 5.11 MIL/uL   Hemoglobin 11.4 (L) 12.0 - 15.0 g/dL   HCT 32.6 (L) 36.0 - 46.0 %   MCV 90.3 80.0 - 100.0 fL   MCH 31.6 26.0 - 34.0 pg   MCHC 35.0 30.0 - 36.0 g/dL   RDW 15.1 11.5 - 15.5 %   Platelets 334 150 - 400 K/uL   nRBC 0.2 0.0 - 0.2 %   Neutrophils Relative % 73 %   Neutro Abs 7.5 1.7 - 7.7 K/uL   Lymphocytes Relative 15 %   Lymphs Abs 1.5 0.7 - 4.0 K/uL   Monocytes Relative 10 %   Monocytes Absolute 1.1 (H) 0.1 - 1.0 K/uL   Eosinophils Relative 1 %   Eosinophils Absolute 0.1 0.0 - 0.5 K/uL   Basophils Relative 0 %   Basophils Absolute 0.0 0.0 - 0.1 K/uL  Immature Granulocytes 1 %   Abs Immature Granulocytes 0.14 (H) 0.00 - 0.07 K/uL  Basic metabolic panel     Status: Abnormal   Collection Time: 09/05/20  2:31 AM  Result Value Ref Range   Sodium 131 (L) 135 - 145 mmol/L   Potassium 4.4 3.5 - 5.1 mmol/L   Chloride 95 (L) 98 - 111 mmol/L   CO2 25 22 - 32 mmol/L   Glucose, Bld 136 (H) 70 - 99 mg/dL   BUN 12 8 - 23 mg/dL   Creatinine, Ser 0.71 0.44 - 1.00 mg/dL   Calcium 8.3 (L) 8.9 - 10.3 mg/dL   GFR, Estimated >60 >60 mL/min   Anion gap 11 5 - 15  Magnesium     Status: None   Collection Time: 09/05/20  2:31 AM  Result Value Ref Range   Magnesium 2.0 1.7 - 2.4 mg/dL  Glucose, capillary     Status: Abnormal   Collection Time: 09/05/20  7:56 AM  Result Value Ref Range   Glucose-Capillary 139 (H) 70 - 99 mg/dL  Glucose, capillary     Status: Abnormal   Collection Time: 09/05/20 12:17 PM  Result Value Ref Range   Glucose-Capillary 178 (H) 70 - 99 mg/dL    Recent Results (from the past 240  hour(s))  Blood culture (routine single)     Status: None   Collection Time: 08/30/20  6:33 PM   Specimen: BLOOD  Result Value Ref Range Status   Specimen Description BLOOD BLOOD RIGHT FOREARM  Final   Special Requests   Final    BOTTLES DRAWN AEROBIC ONLY Blood Culture results may not be optimal due to an inadequate volume of blood received in culture bottles   Culture   Final    NO GROWTH 5 DAYS Performed at [facility], [place] [address], [place], [place] [address]    Report Status 09/04/2020 FINAL  Final  Resp Panel by RT-PCR (Flu A&B, Covid) Nasopharyngeal Swab     Status: None   Collection Time: 08/30/20  6:33 PM   Specimen: Nasopharyngeal Swab; Nasopharyngeal(NP) swabs in vial transport medium  Result Value Ref Range Status   SARS Coronavirus 2 by RT PCR NEGATIVE NEGATIVE Final    Comment: (NOTE) SARS-CoV-2 target nucleic acids are NOT DETECTED.  The SARS-CoV-2 RNA is generally detectable in upper respiratory specimens during the acute phase of infection. The lowest concentration of SARS-CoV-2 viral copies this assay can detect is 138 copies/mL. A negative result does not preclude SARS-Cov-2 infection and should not be used as the sole basis for treatment or other patient management decisions. A negative result may occur with  improper specimen collection/handling, submission of specimen other than nasopharyngeal swab, presence of viral mutation(s) within the areas targeted by this assay, and inadequate number of viral copies(<138 copies/mL). A negative result must be combined with clinical observations, patient history, and epidemiological information. The expected result is Negative.  Fact Sheet for Patients:  EntrepreneurPulse.com.au  Fact Sheet for Healthcare Providers:  IncredibleEmployment.be  This test is no t yet approved or cleared by the [place] FDA and  has been authorized for detection and/or diagnosis of  SARS-CoV-2 by FDA under an Emergency Use Authorization (EUA). This EUA will remain  in effect (meaning this test can be used) for the duration of the COVID-19 declaration under Section 564(b)(1) of the Act, 21 U.S.C.section 360bbb-3(b)(1), unless the authorization is terminated  or revoked sooner.       Influenza A by  PCR NEGATIVE NEGATIVE Final   Influenza B by PCR NEGATIVE NEGATIVE Final    Comment: (NOTE) The Xpert Xpress SARS-CoV-2/FLU/RSV plus assay is intended as an aid in the diagnosis of influenza from Nasopharyngeal swab specimens and should not be used as a sole basis for treatment. Nasal washings and aspirates are unacceptable for Xpert Xpress SARS-CoV-2/FLU/RSV testing.  Fact Sheet for Patients: EntrepreneurPulse.com.au  Fact Sheet for Healthcare Providers: IncredibleEmployment.be  This test is not yet approved or cleared by the [place] FDA and has been authorized for detection and/or diagnosis of SARS-CoV-2 by FDA under an Emergency Use Authorization (EUA). This EUA will remain in effect (meaning this test can be used) for the duration of the COVID-19 declaration under Section 564(b)(1) of the Act, 21 U.S.C. section 360bbb-3(b)(1), unless the authorization is terminated or revoked.  Performed at [facility], [place] [address] [place], [place] [address]   Culture, blood (single)     Status: None   Collection Time: 08/30/20  8:25 PM   Specimen: BLOOD RIGHT HAND  Result Value Ref Range Status   Specimen Description BLOOD RIGHT HAND  Final   Special Requests   Final    BOTTLES DRAWN AEROBIC AND ANAEROBIC Blood Culture results may not be optimal due to an inadequate volume of blood received in culture bottles   Culture   Final    NO GROWTH 5 DAYS Performed at [facility], [place] [address] [place], [place] [address]    Report Status 09/04/2020 FINAL  Final  MRSA PCR Screening     Status: None   Collection Time:  08/31/20  1:07 AM   Specimen: Nasal Mucosa; Nasopharyngeal  Result Value Ref Range Status   MRSA by PCR NEGATIVE NEGATIVE Final    Comment:        The GeneXpert MRSA Assay (FDA approved for NASAL specimens only), is one component of a comprehensive MRSA colonization surveillance program. It is not intended to diagnose MRSA infection nor to guide or monitor treatment for MRSA infections. Performed at [facility], [place] [address], [place], [place] [address]   Respiratory Panel by PCR     Status: None   Collection Time: 09/01/20  1:48 PM   Specimen: Nasopharyngeal Swab; Respiratory  Result Value Ref Range Status   Adenovirus NOT DETECTED NOT DETECTED Final   Coronavirus 229E NOT DETECTED NOT DETECTED Final    Comment: (NOTE) The Coronavirus on the Respiratory Panel, DOES NOT test for the novel  Coronavirus (2019 nCoV)    Coronavirus HKU1 NOT DETECTED NOT DETECTED Final   Coronavirus NL63 NOT DETECTED NOT DETECTED Final   Coronavirus OC43 NOT DETECTED NOT DETECTED Final   Metapneumovirus NOT DETECTED NOT DETECTED Final   Rhinovirus / Enterovirus NOT DETECTED NOT DETECTED Final   Influenza A NOT DETECTED NOT DETECTED Final   Influenza B NOT DETECTED NOT DETECTED Final   Parainfluenza Virus 1 NOT DETECTED NOT DETECTED Final   Parainfluenza Virus 2 NOT DETECTED NOT DETECTED Final   Parainfluenza Virus 3 NOT DETECTED NOT DETECTED Final   Parainfluenza Virus 4 NOT DETECTED NOT DETECTED Final   Respiratory Syncytial Virus NOT DETECTED NOT DETECTED Final   Bordetella pertussis NOT DETECTED NOT DETECTED Final   Bordetella Parapertussis NOT DETECTED NOT DETECTED Final   Chlamydophila pneumoniae NOT DETECTED NOT DETECTED Final   Mycoplasma pneumoniae NOT DETECTED NOT DETECTED Final    Comment: Performed at [facility], [place]. [address] [place], [place] [address]  Radiology Studies: No results found. DG Hand Complete Right  Final Result    DG Wrist Complete Right   Final Result    DG Chest Port 1 View  Final Result      Scheduled Meds: . azithromycin  500 mg Oral Daily  . carvedilol  3.125 mg Oral BID WC  . citalopram  20 mg Oral QHS  . clonazePAM  0.5 mg Oral QHS  . donepezil  10 mg Oral QHS  . insulin aspart  0-15 Units Subcutaneous TID WC  . latanoprost  1 drop Both Eyes QHS  . levothyroxine  50 mcg Oral QAC breakfast  . metroNIDAZOLE  500 mg Oral Q8H  . pantoprazole  40 mg Oral Daily  . rivaroxaban  20 mg Oral Q supper  . sodium chloride flush  3 mL Intravenous Q12H   PRN Meds: acetaminophen **OR** acetaminophen, menthol-cetylpyridinium, phenol, polyethylene glycol Continuous Infusions:    LOS: 5 days  Time spent: Greater than 50% of the 35 minute visit was spent in counseling/coordination of care for the patient as laid out in the A&P.   Dwyane Dee, MD [facility] 09/05/2020, 12:31 PM

## 2020-09-05 NOTE — Discharge Instructions (Signed)

## 2020-09-05 NOTE — Care Management (Signed)
Provided patient's daughter Judson Roch with Xarelto 30 day free card.   Magdalen Spatz RN

## 2020-09-06 ENCOUNTER — Inpatient Hospital Stay (HOSPITAL_COMMUNITY): Payer: Medicare Other

## 2020-09-06 DIAGNOSIS — A419 Sepsis, unspecified organism: Secondary | ICD-10-CM | POA: Diagnosis not present

## 2020-09-06 DIAGNOSIS — R6520 Severe sepsis without septic shock: Secondary | ICD-10-CM | POA: Diagnosis not present

## 2020-09-06 DIAGNOSIS — R918 Other nonspecific abnormal finding of lung field: Secondary | ICD-10-CM | POA: Diagnosis not present

## 2020-09-06 LAB — CBC WITH DIFFERENTIAL/PLATELET
Abs Immature Granulocytes: 0.14 10*3/uL — ABNORMAL HIGH (ref 0.00–0.07)
Basophils Absolute: 0 10*3/uL (ref 0.0–0.1)
Basophils Relative: 0 %
Eosinophils Absolute: 0 10*3/uL (ref 0.0–0.5)
Eosinophils Relative: 0 %
HCT: 36.4 % (ref 36.0–46.0)
Hemoglobin: 12.1 g/dL (ref 12.0–15.0)
Immature Granulocytes: 1 %
Lymphocytes Relative: 10 %
Lymphs Abs: 1.1 10*3/uL (ref 0.7–4.0)
MCH: 30 pg (ref 26.0–34.0)
MCHC: 33.2 g/dL (ref 30.0–36.0)
MCV: 90.1 fL (ref 80.0–100.0)
Monocytes Absolute: 0.9 10*3/uL (ref 0.1–1.0)
Monocytes Relative: 9 %
Neutro Abs: 8.7 10*3/uL — ABNORMAL HIGH (ref 1.7–7.7)
Neutrophils Relative %: 80 %
Platelets: 350 10*3/uL (ref 150–400)
RBC: 4.04 MIL/uL (ref 3.87–5.11)
RDW: 15 % (ref 11.5–15.5)
WBC: 10.9 10*3/uL — ABNORMAL HIGH (ref 4.0–10.5)
nRBC: 0 % (ref 0.0–0.2)

## 2020-09-06 LAB — HEMOGLOBIN A1C
Hgb A1c MFr Bld: 6.8 % — ABNORMAL HIGH (ref 4.8–5.6)
Mean Plasma Glucose: 148.46 mg/dL

## 2020-09-06 LAB — GLUCOSE, CAPILLARY
Glucose-Capillary: 158 mg/dL — ABNORMAL HIGH (ref 70–99)
Glucose-Capillary: 331 mg/dL — ABNORMAL HIGH (ref 70–99)
Glucose-Capillary: 116 mg/dL — ABNORMAL HIGH (ref 70–99)
Glucose-Capillary: 251 mg/dL — ABNORMAL HIGH (ref 70–99)

## 2020-09-06 LAB — COMPREHENSIVE METABOLIC PANEL
ALT: 22 U/L (ref 0–44)
AST: 21 U/L (ref 15–41)
Albumin: 2 g/dL — ABNORMAL LOW (ref 3.5–5.0)
Alkaline Phosphatase: 71 U/L (ref 38–126)
Anion gap: 12 (ref 5–15)
BUN: 11 mg/dL (ref 8–23)
CO2: 23 mmol/L (ref 22–32)
Calcium: 8.4 mg/dL — ABNORMAL LOW (ref 8.9–10.3)
Chloride: 95 mmol/L — ABNORMAL LOW (ref 98–111)
Creatinine, Ser: 0.77 mg/dL (ref 0.44–1.00)
GFR, Estimated: >60 mL/min (ref >60)
Glucose, Bld: 169 mg/dL — ABNORMAL HIGH (ref 70–99)
Potassium: 4.3 mmol/L (ref 3.5–5.1)
Sodium: 130 mmol/L — ABNORMAL LOW (ref 135–145)
Total Bilirubin: 0.7 mg/dL (ref 0.3–1.2)
Total Protein: 6.5 g/dL (ref 6.5–8.1)

## 2020-09-06 LAB — AMMONIA: Ammonia: 33 umol/L (ref 9–35)

## 2020-09-06 LAB — VITAMIN B12: Vitamin B-12: 723 pg/mL (ref 180–914)

## 2020-09-06 MED ORDER — CLONAZEPAM 0.5 MG PO TABS: 0.2500 mg | ORAL_TABLET | Freq: Every day | ORAL | Status: DC

## 2020-09-06 MED ORDER — CLONAZEPAM 0.25 MG PO TBDP
0.2500 mg | ORAL_TABLET | Freq: Every day | ORAL | Status: DC
  Administered 2020-09-06 – 2020-09-12 (×7): 0.25 mg via ORAL
  Filled 2020-09-06 (×7): qty 1

## 2020-09-06 MED ORDER — IOHEXOL 300 MG/ML  SOLN
75.0000 mL | Freq: Once | INTRAMUSCULAR | Status: AC | PRN
  Administered 2020-09-06: 14:00:00 75 mL via INTRAVENOUS

## 2020-09-06 NOTE — Progress Notes (Signed)
PROGRESS NOTE    Ashlee Greer  NIO:[id number] DOB: [birth year] DOA: 08/30/2020 PCP: [name], MD  Chief Complaint  Patient presents with  . Loss of Consciousness   Brief Narrative:  Ashlee Greer 86 y.o.femalewith medical history significant ofA. fib not on anticoagulation, anxiety/depression, arthritis, diabetes, glaucoma, hypertension, hypothyroidism who presented with recurrent loss of consciousness secondary to orthostatic hypotension.  Patient was recently discharged from [facility] after treatment for TIA, UTI, pneumonia. She also had some fatigue and orthostasis while she was there. She was discharged on Bactrim for UTI and Levaquin for her pneumonia. She was recommended to go to an SNF at discharge but family is an Therapist, sports and stated they would care for patient at home.  However, her BP remained low prior to discharge which concerned family.  Due to ongoing ortho hypoTN on returning home, family brought patient to the hospital.    Workup in the ER was notable for temp 101.4, tachypnea, tachycardia. CXR showed left basilar infiltrate vs atelectasis.  Lactic was elevated and she was started on IVF and cefepime. Covid and flu swab negative.  She continued to improve with antibiotics. She was evaluated by physical therapy and recommended for SNF rehab at discharge.  This was discussed with her family who was also in agreement.  Assessment & Plan:   Active Problems:   History of TIA (transient ischemic attack)   PAF (paroxysmal atrial fibrillation) (HCC)   Anxiety and depression   Diabetes ([place])   Hypothyroidism   Hand pain, right  Severe sepsis 2/2 Community Acquired Pneumonia (HCC)-resolved as of 09/04/2020 - now clinically improved on IV abx; okay to change to Azithro (continue flagyl) and will complete course (d/c cefepime) - follow up cultures: negative to date - finishing out 7 day course total   3.8 x 3.5 cm L hilar Mass      - she's completed  course of abx as noted above      - will consult pulmonology for further recommendations  Acute Metabolic Encephalopathy       - she's [place]&Ox1 today, slow to respond, seems to be neglecting R side (noted in PT note from 12/27) - per daughter, she was independent 2 weeks ago at home and has daily decline        - MRI at 12/16 at [facility] was negative for acute events       - head CT today without acute findings       - will get MRI brain and EEG -> consider neurology c/s       - TSH wnl, follow ammonia, B12, RPR  Orthostatic hypotension-resolved as of 09/02/2020 -Also possibly multifactorial in setting of infection as well as polypharmacy.  Her medications reviewed and she is on sedating medications including clonazepam as well as anticholinergic mechanisms with nortriptyline which will be held; Previous MD has also counseled her bedside that her clonazepam dose needs to be reduced at discharge and ideally would benefit from weaning off of it altogether (continue to taper given her AMS) -resume celexa at lower dose, 20 mg daily on 12/25 -Continue monitoring blood pressure which is improving -Repeat orthostatic blood pressure: negative on 12/24  History of TIA (transient ischemic attack) - see afib - never received records from [facility] and depression -See orthostatic hypotension -Clonazepam dose significantly reduced and holding nortriptyline -Celexa on hold but will try to resume soon  PAF (paroxysmal atrial fibrillation) ([place]) -Discussed bedside with patient; Eliquis made  her confused in the past.  Denies any significant bleeding events.  She is open to alternative anticoagulation -Discussed that we would trial Xarelto (started 12/27) -Continue Coreg  Acute lower UTI-resolved as of 09/04/2020 -She does endorse some symptoms.  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  Consultants:   pulm  Procedures:  none  Antimicrobials:  Anti-infectives (From admission, onward)   Start     Dose/Rate Route Frequency Ordered Stop   09/02/20 1215  azithromycin (ZITHROMAX) tablet 500 mg        500 mg Oral Daily 09/02/20 1122 09/07/20 1524   08/31/20 1615  metroNIDAZOLE (FLAGYL) tablet 500 mg        500 mg Oral Every 8 hours 08/31/20 1608 09/07/20 1359   08/30/20 2100  ceFEPIme (MAXIPIME) 2 g in sodium chloride 0.9 % 100 mL IVPB  Status:  Discontinued        2 g 200 mL/hr over 30 Minutes Intravenous Every 12 hours 08/30/20 2011 09/02/20 1122     Subjective: "Not to good" "I don't know"  Objective: Vitals:   09/05/20 1423 09/05/20 2041 09/06/20 0500 09/06/20 1339  BP: 112/77 (!) 157/93 (!) 165/100 135/73  Pulse: 92 91 (!) 104 88  Resp: 18 20 20 20   Temp: 98.1 F (36.7 C) 99.2 F (37.3 C) 99.2 F (37.3 C) 99.1 F (37.3 C)  TempSrc: Oral Oral Oral Oral  SpO2: 91% 92% 93% 93%  Weight:      Height:        Intake/Output Summary (Last 24 hours) at 09/06/2020 1525 Last data filed at 09/06/2020 1000 Gross per 24 hour  Intake 220 ml  Output 1175 ml  Net -955 ml   Filed Weights   08/30/20 1748  Weight: 88.5 kg     Examination:  General exam: Appears calm and comfortable.  Being fed breakfast. Respiratory system: Clear to auscultation. Respiratory effort normal. Cardiovascular system: S1 & S2 heard, RRR.  Gastrointestinal system: Abdomen is nondistended, soft and nontender. Central nervous system: Alert, but disoriented, slow to respond  Follows commands slowly and inconsistently.  Moves LUE more than right.  Moves lower extremities. Extremities: no LEE  Data Reviewed: I have personally reviewed following labs and imaging studies  CBC: Recent Labs  Lab 09/02/20 0142 09/03/20 0436 09/04/20 0609 09/05/20 0056 09/06/20 0834  WBC 10.7* 9.7 10.7* 10.3 10.9*  NEUTROABS 8.4* 6.9 7.9* 7.5 8.7*  HGB 10.4* 11.2* 11.0* 11.4* 12.1  HCT 30.8* 34.2* 32.5* 32.6* 36.4  MCV 91.9 92.4  91.0 90.3 90.1  PLT 199 230 269 334 250    Basic Metabolic Panel: Recent Labs  Lab 09/01/20 0145 09/02/20 0142 09/03/20 0436 09/04/20 0609 09/05/20 0231 09/06/20 0834  NA 133* 133* 136 134* 131* 130*  K 4.1 4.0 4.5 4.8 4.4 4.3  CL 102 101 103 98 95* 95*  CO2 24 22 25 24 25 23   GLUCOSE 127* 123* 143* 132* 136* 169*  BUN 16 15 15 12 12 11   CREATININE 0.94 0.79 0.77 0.71 0.71 0.77  CALCIUM 8.4* 8.4* 8.4* 8.5* 8.3* 8.4*  MG 1.9 1.9 2.0 1.9 2.0  --     GFR: Estimated Creatinine Clearance: 52 mL/min (by C-G formula based on SCr of 0.77 mg/dL).  Liver Function Tests: Recent Labs  Lab 08/30/20 1833 08/31/20 0603 09/06/20 0834  AST 94* 85* 21  ALT 34 33 22  ALKPHOS 63 57 71  BILITOT 0.8 0.8 0.7  PROT 6.3* 5.7* 6.5  ALBUMIN 2.7* 2.3* 2.0*    CBG: Recent Labs  Lab 09/05/20 1217 09/05/20 1655 09/05/20 2039 09/06/20 0809 09/06/20 1224  GLUCAP 178* 156* 116* 158* 331*     Recent Results (from the past 240 hour(s))  Blood culture (routine single)     Status: None   Collection Time: 08/30/20  6:33 PM   Specimen: BLOOD  Result Value Ref Range Status   Specimen Description BLOOD BLOOD RIGHT  FOREARM  Final   Special Requests   Final    BOTTLES DRAWN AEROBIC ONLY Blood Culture results may not be optimal due to an inadequate volume of blood received in culture bottles   Culture   Final    NO GROWTH 5 DAYS Performed at [facility], [place] [address] [place], [place] [address]    Report Status 09/04/2020 FINAL  Final  Resp Panel by RT-PCR (Flu Julius Matus&B, Covid) Nasopharyngeal Swab     Status: None   Collection Time: 08/30/20  6:33 PM   Specimen: Nasopharyngeal Swab; Nasopharyngeal(NP) swabs in vial transport medium  Result Value Ref Range Status   SARS Coronavirus 2 by RT PCR NEGATIVE NEGATIVE Final    Comment: (NOTE) SARS-CoV-2 target nucleic acids are NOT DETECTED.  The SARS-CoV-2 RNA is generally detectable in upper respiratory specimens during the acute phase of infection. The lowest concentration of SARS-CoV-2 viral copies this assay can detect is 138 copies/mL. Rosalee Tolley negative result does not preclude SARS-Cov-2 infection and should not be used as the sole basis for treatment or other patient management decisions. Wellington Winegarden negative result may occur with  improper specimen collection/handling, submission of specimen other than nasopharyngeal swab, presence of viral mutation(s) within the areas targeted by this assay, and inadequate number of viral copies(<138 copies/mL). Alanie Syler negative result must be combined with clinical observations, patient history, and epidemiological information. The expected result is Negative.  Fact Sheet for Patients:  EntrepreneurPulse.com.au  Fact Sheet for Healthcare Providers:  IncredibleEmployment.be  This test is no t yet approved or cleared by the [place] FDA and  has been authorized for detection and/or diagnosis of SARS-CoV-2 by FDA under an Emergency Use Authorization (EUA). This EUA will remain  in effect (meaning this test can be used) for the duration of the COVID-19 declaration under Section 564(b)(1)  of the Act, 21 U.S.C.section 360bbb-3(b)(1), unless the authorization is terminated  or revoked sooner.       Influenza Oney Tatlock by PCR NEGATIVE NEGATIVE Final   Influenza B by PCR NEGATIVE NEGATIVE Final    Comment: (NOTE) The  Xpert Xpress SARS-CoV-2/FLU/RSV plus assay is intended as an aid in the diagnosis of influenza from Nasopharyngeal swab specimens and should not be used as [place] sole basis for treatment. Nasal washings and aspirates are unacceptable for Xpert Xpress SARS-CoV-2/FLU/RSV testing.  Fact Sheet for Patients: EntrepreneurPulse.com.au  Fact Sheet for Healthcare Providers: IncredibleEmployment.be  This test is not yet approved or cleared by the [place] FDA and has been authorized for detection and/or diagnosis of SARS-CoV-2 by FDA under an Emergency Use Authorization (EUA). This EUA will remain in effect (meaning this test can be used) for the duration of the COVID-19 declaration under Section 564(b)(1) of the Act, 21 U.S.C. section 360bbb-3(b)(1), unless the authorization is terminated or revoked.  Performed at [facility], [place] [address] [place], [place] [address]   Culture, blood (single)     Status: None   Collection Time: 08/30/20  8:25 PM   Specimen: BLOOD RIGHT HAND  Result Value Ref Range Status   Specimen Description BLOOD RIGHT HAND  Final   Special Requests   Final    BOTTLES DRAWN AEROBIC AND ANAEROBIC Blood Culture results may not be optimal due to an inadequate volume of blood received in culture bottles   Culture   Final    NO GROWTH 5 DAYS Performed at [facility], [place] [address], [place], [place] [address]    Report Status 09/04/2020 FINAL  Final  MRSA PCR Screening     Status: None   Collection Time: 08/31/20  1:07 AM   Specimen: Nasal Mucosa; Nasopharyngeal  Result Value Ref Range Status   MRSA by PCR NEGATIVE NEGATIVE Final    Comment:        The GeneXpert MRSA Assay (FDA approved for  NASAL specimens only), is one component of Kayse Puccini comprehensive MRSA colonization surveillance program. It is not intended to diagnose MRSA infection nor to guide or monitor treatment for MRSA infections. Performed at [facility], [place] [address], [place], [place] [address]   Respiratory Panel by PCR     Status: None   Collection Time: 09/01/20  1:48 PM   Specimen: Nasopharyngeal Swab; Respiratory  Result Value Ref Range Status   Adenovirus NOT DETECTED NOT DETECTED Final   Coronavirus 229E NOT DETECTED NOT DETECTED Final    Comment: (NOTE) The Coronavirus on the Respiratory Panel, DOES NOT test for the novel  Coronavirus (2019 nCoV)    Coronavirus HKU1 NOT DETECTED NOT DETECTED Final   Coronavirus NL63 NOT DETECTED NOT DETECTED Final   Coronavirus OC43 NOT DETECTED NOT DETECTED Final   Metapneumovirus NOT DETECTED NOT DETECTED Final   Rhinovirus / Enterovirus NOT DETECTED NOT DETECTED Final   Influenza Riddik Senna NOT DETECTED NOT DETECTED Final   Influenza B NOT DETECTED NOT DETECTED Final   Parainfluenza Virus 1 NOT DETECTED NOT DETECTED Final   Parainfluenza Virus 2 NOT DETECTED NOT DETECTED Final   Parainfluenza Virus 3 NOT DETECTED NOT DETECTED Final   Parainfluenza Virus 4 NOT DETECTED NOT DETECTED Final   Respiratory Syncytial Virus NOT DETECTED NOT DETECTED Final   Bordetella pertussis NOT DETECTED NOT DETECTED Final   Bordetella Parapertussis NOT DETECTED NOT DETECTED Final   Chlamydophila pneumoniae NOT DETECTED NOT DETECTED Final   Mycoplasma pneumoniae NOT DETECTED NOT DETECTED Final    Comment: Performed at [facility], [place]. [address] [place],  [address]  Resp Panel by RT-PCR (Flu Tylor Gambrill&B, Covid) Nasopharyngeal Swab     Status: None   Collection  Time: 09/05/20  4:47 PM   Specimen: Nasopharyngeal Swab; Nasopharyngeal(NP) swabs in vial transport medium  Result Value Ref Range Status   SARS Coronavirus 2 by RT PCR NEGATIVE NEGATIVE Final    Comment:  (NOTE) SARS-CoV-2 target nucleic acids are NOT DETECTED.  The SARS-CoV-2 RNA is generally detectable in upper respiratory specimens during the acute phase of infection. The lowest concentration of SARS-CoV-2 viral copies this assay can detect is 138 copies/mL. Gizel Riedlinger negative result does not preclude SARS-Cov-2 infection and should not be used as the sole basis for treatment or other patient management decisions. Tondra Reierson negative result may occur with  improper specimen collection/handling, submission of specimen other than nasopharyngeal swab, presence of viral mutation(s) within the areas targeted by this assay, and inadequate number of viral copies(<138 copies/mL). Andranik Jeune negative result must be combined with clinical observations, patient history, and epidemiological information. The expected result is Negative.  Fact Sheet for Patients:  EntrepreneurPulse.com.au  Fact Sheet for Healthcare Providers:  IncredibleEmployment.be  This test is no t yet approved or cleared by the [place] FDA and  has been authorized for detection and/or diagnosis of SARS-CoV-2 by FDA under an Emergency Use Authorization (EUA). This EUA will remain  in effect (meaning this test can be used) for the duration of the COVID-19 declaration under Section 564(b)(1) of the Act, 21 U.S.C.section 360bbb-3(b)(1), unless the authorization is terminated  or revoked sooner.       Influenza Noriko Macari by PCR NEGATIVE NEGATIVE Final   Influenza B by PCR NEGATIVE NEGATIVE Final    Comment: (NOTE) The Xpert Xpress SARS-CoV-2/FLU/RSV plus assay is intended as an aid in the diagnosis of influenza from Nasopharyngeal swab specimens and should not be used as Marilyne Haseley sole basis for treatment. Nasal washings and aspirates are unacceptable for Xpert Xpress SARS-CoV-2/FLU/RSV testing.  Fact Sheet for Patients: EntrepreneurPulse.com.au  Fact Sheet for Healthcare  Providers: IncredibleEmployment.be  This test is not yet approved or cleared by the [place] FDA and has been authorized for detection and/or diagnosis of SARS-CoV-2 by FDA under an Emergency Use Authorization (EUA). This EUA will remain in effect (meaning this test can be used) for the duration of the COVID-19 declaration under Section 564(b)(1) of the Act, 21 U.S.C. section 360bbb-3(b)(1), unless the authorization is terminated or revoked.  Performed at [facility], [place] [address] [place], [place] [address]          Radiology Studies: CT HEAD WO CONTRAST  Result Date: 09/06/2020 CLINICAL DATA:  Delirium EXAM: CT HEAD WITHOUT CONTRAST TECHNIQUE: Contiguous axial images were obtained from the base of the skull through the vertex without intravenous contrast. COMPARISON:  08/24/2020 FINDINGS: Brain: No evidence of acute infarction, hemorrhage, extra-axial collection, ventriculomegaly, or mass effect. Generalized cerebral atrophy. Periventricular white matter low attenuation likely secondary to microangiopathy. Vascular: Cerebrovascular atherosclerotic calcifications are noted. Skull: Negative for fracture or focal lesion. Sinuses/Orbits: Visualized portions of the orbits are unremarkable. Visualized portions of the paranasal sinuses are unremarkable. Visualized portions of the mastoid air cells are unremarkable. Other: None. IMPRESSION: 1. No acute intracranial pathology. 2. Chronic microvascular disease and cerebral atrophy. Electronically Signed   By: Kathreen Devoid   On: 09/06/2020 13:50   CT CHEST W CONTRAST  Result Date: 09/06/2020 CLINICAL DATA:  Respiratory failure. EXAM: CT CHEST WITH CONTRAST TECHNIQUE: Multidetector CT imaging of the chest was performed during intravenous contrast administration. CONTRAST:  30mL OMNIPAQUE IOHEXOL 300 MG/ML  SOLN COMPARISON:  Radiograph of same day. FINDINGS: Cardiovascular: Atherosclerosis of  thoracic aorta is noted  without aneurysm or dissection. Mild cardiomegaly is noted. No pericardial effusion is noted. Mediastinum/Nodes: Thyroid gland and esophagus are unremarkable. 3.8 x 3.5 cm left hilar mass is noted concerning for malignancy or metastatic disease. Lungs/Pleura: No pneumothorax is noted. Minimal right posterior basilar subsegmental atelectasis is noted. Small left pleural effusion is noted with adjacent left basilar subsegmental atelectasis or infiltrate. Upper Abdomen: No acute abnormality. Musculoskeletal: No chest wall abnormality. No acute or significant osseous findings. IMPRESSION: 1. 3.8 x 3.5 cm left hilar mass is noted concerning for malignancy or metastatic disease. PET scan is recommended for further evaluation. 2. Small left pleural effusion is noted with adjacent left basilar subsegmental atelectasis or infiltrate. 3. Aortic atherosclerosis. Aortic Atherosclerosis (ICD10-I70.0). Electronically Signed   By: Marijo Conception M.D.   On: 09/06/2020 13:49   DG CHEST PORT 1 VIEW  Addendum Date: 09/06/2020   ADDENDUM REPORT: 09/06/2020 11:03 ADDENDUM: Findings of mass effect in the LEFT lower lobe upon bronchial structures were discussed with the referring physician as outlined below. Findings are more likely due to worsening of pulmonary parenchymal process. CT of the chest was however suggested to further evaluate this finding given rapid change and to exclude the possibility of acute vascular process related to the aorta given the location. These results were called by telephone at the time of interpretation on 09/06/2020 at 11:03 am to provider Blessings Inglett Cephus Slater , who verbally acknowledged these results. Electronically Signed   By: Zetta Bills M.D.   On: 09/06/2020 11:03   Result Date: 09/06/2020 CLINICAL DATA:  Cough, hypotension atrial fibrillation EXAM: PORTABLE CHEST 1 VIEW COMPARISON:  August 30, 2020 FINDINGS: Trachea midline. The LEFT lower lobe bronchus is splayed atop an area measuring  approximately 8.4 x 6.5 cm with some peripheral airspace disease noted in the LEFT lower lobe with obscured LEFT hemidiaphragm. Airspace process more confluent than on the prior study, likely associated with small effusion in the LEFT lung base. RIGHT chest is clear. No acute skeletal process. IMPRESSION: Mass effect along the LEFT retrocardiac region up lifting the LEFT mainstem and lower lobe bronchi with some lower lobe bronchial narrowing suggested, associated with airspace disease and effusion. Findings are suspicious first and foremost for developing pulmonary abscess or worsening pneumonia though there is no air-fluid level seen on the current study. Vascular etiology and aneurysm also considered given the appearance though felt less likely. Would suggest contrasted CT of the chest for further evaluation given the rapid change in the appearance of the LEFT lower lobe since the previous study. Camile Esters call is out to the referring provider to further discuss findings in the above case. Electronically Signed: By: Zetta Bills M.D. On: 09/06/2020 10:30        Scheduled Meds: . azithromycin  500 mg Oral Daily  . carvedilol  3.125 mg Oral BID WC  . citalopram  20 mg Oral QHS  . clonazePAM  0.5 mg Oral QHS  . donepezil  10 mg Oral QHS  . insulin aspart  0-15 Units Subcutaneous TID WC  . latanoprost  1 drop Both Eyes QHS  . levothyroxine  50 mcg Oral QAC breakfast  . metroNIDAZOLE  500 mg Oral Q8H  . pantoprazole  40 mg Oral Daily  . rivaroxaban  20 mg Oral Q supper  . sodium chloride flush  3 mL Intravenous Q12H   Continuous Infusions:   LOS: 6 days    Time spent: over 30 min  Fayrene Helper, MD [facility]   To contact the attending provider between 7A-7P or the covering provider during after hours 7P-7A, please log into the web site [email or web address] and access using universal [place] password for that web site. If you do not have the password, please call the hospital  operator.  09/06/2020, 3:25 PM

## 2020-09-06 NOTE — H&P (View-Only) (Signed)
NAME:  Ashlee Greer, MRN:  [id number], DOB:  [birth year], LOS: 6 ADMISSION DATE:  08/30/2020, CONSULTATION DATE:  09/06/2020 REFERRING MD:  Dr.Powell, CHIEF COMPLAINT:  Right lung mass    Brief History:  86 year old female presents with loss of consciousness felt secondary to orthostatic hypotension. Also found to be febrile, tachyonic, and tachycardic. CT chest was obtained to further evaluate CAP which revealed 3.8 x 3.5 left hilar mass for which pulmonary was consulted.    History of Present Illness:  Ashlee Greer is a 40yp female with PMH significant for A-fib on full dose aspirin,  thyroid disease, HTN, Type II diabetes, depression, and anxety who presented to the emergency department with complaints of loss of consciousness felt secondary to orthostatic hypotension.   Of note patient was recently discharged from [facility] for TIA, UTI, and PNA. On discharge patient was treated with Bactrim for UTI and Levaquin for PNA. On discharge patients daughter who is a nurse reports continued hypotension promoting return to ED. On arrival she was seen febrile, tachyonic, tachycardiac, and with an elevated lactic meeting criteria for severe sepsis. She was admitted to hospitalist team for further management and admission   On 09/06/2020 patient received CT chest to further evaluate CAP which incidentally found a 3.8 x 3.5 left hillar mass. PCCM was consulted for further management.  Care everywhere reviewed and it appears patient has had recent aspiration event but not other chronic pulmonary disease. She is a never smoker.   Past Medical History:  HTN HLD Hypothyroid  A-fib Type II diabetes   Significant Hospital Events:  Admitted for severe sepsis 12/22  Consults:  Pulmonary   Procedures:    Significant Diagnostic Tests:  Chest CT 12/29 > 3.8 x 3.5 cm left hilar mass is noted concerning for malignancy or metastatic disease. Small left pleural effusion is noted with adjacent left  basilar subsegmental atelectasis or infiltrate.  Micro Data:  COVID 12/28 > Negative   Antimicrobials:  Cefepime 12/22 > 12/25  Flagyl 12/23 > 12/29 Azithromycin 12/25 >   Interim History / Subjective:  Lying in bed undergoing EEG Daughter at bedside and undated   Objective   Blood pressure 135/73, pulse 88, temperature 99.1 F (37.3 C), temperature source Oral, resp. rate 20, height 5\' 1"  (1.549 m), weight 88.5 kg, SpO2 93 %.        Intake/Output Summary (Last 24 hours) at 09/06/2020 1602 Last data filed at 09/06/2020 1000 Gross per 24 hour  Intake 220 ml  Output 850 ml  Net -630 ml   Filed Weights   08/30/20 1748  Weight: 88.5 kg    Examination: General: Chronically ill appearing deconditioned elderly female lying in bed in NAD HEENT: [place]/AT, MM pink/moist, PERRL,  Neuro: Lethargic will arouse to voice but unable to engage in conversation CV: s1s2 regular rate and rhythm, no murmur, rubs, or gallops,  PULM:  Clear to ascultation slightly diminished left side, remains on RA GI: soft, bowel sounds active in all 4 quadrants, non-tender, non-distended Extremities: warm/dry, no edema  Skin: no rashes or lesions  Resolved Hospital Problem list     Assessment & Plan:  Left 3.8 x 3.5 Hilar Mass  -Seen on chest CT 12/28 P: Hold Xarelto  Continue neurologic workup including MRI brain Tentatively plan for bronch with biopsy 12/31 Encourage pulmonary hygiene  Remains on RA can utilize supplemental oxygen as needed foe Sats greater than 92  Rest of acute and chronic medical conditions managed per primary  PCCM will continue to follow    Best practice (evaluated daily)  Diet: Heart healthy Pain/Anxiety/Delirium protocol (if indicated): PRN VAP protocol (if indicated): N/A DVT prophylaxis: Xarelto currently on hold  GI prophylaxis: PPI Glucose control: SSI Mobility: Bedrest  Disposition:Floor  Goals of Care:  Last date of multidisciplinary goals of care  discussion: 09/06/2020 Family and staff present: Daughter Sarah, Dr. Tamala Julian, Merlene Laughter NP Summary of discussion: Daughter agrees with continued workup to identify underlying cause of rapid decline but states her mother has a living will that states she is DNR. She states her mother did agree to intubation but she feels as if she was a bit confused when this decision was made. She and the family would support a full DNR status if the patient were to decline. For now we will continue with current workup with daily assessment of prognosis  Follow up goals of care discussion due: 09/13/2020 Code Status: LCB but if she worsens family would support transition to DNR  Labs   CBC: Recent Labs  Lab 09/02/20 0142 09/03/20 0436 09/04/20 0609 09/05/20 0056 09/06/20 0834  WBC 10.7* 9.7 10.7* 10.3 10.9*  NEUTROABS 8.4* 6.9 7.9* 7.5 8.7*  HGB 10.4* 11.2* 11.0* 11.4* 12.1  HCT 30.8* 34.2* 32.5* 32.6* 36.4  MCV 91.9 92.4 91.0 90.3 90.1  PLT 199 230 269 334 892    Basic Metabolic Panel: Recent Labs  Lab 09/01/20 0145 09/02/20 0142 09/03/20 0436 09/04/20 0609 09/05/20 0231 09/06/20 0834  NA 133* 133* 136 134* 131* 130*  K 4.1 4.0 4.5 4.8 4.4 4.3  CL 102 101 103 98 95* 95*  CO2 24 22 25 24 25 23   GLUCOSE 127* 123* 143* 132* 136* 169*  BUN 16 15 15 12 12 11   CREATININE 0.94 0.79 0.77 0.71 0.71 0.77  CALCIUM 8.4* 8.4* 8.4* 8.5* 8.3* 8.4*  MG 1.9 1.9 2.0 1.9 2.0  --    GFR: Estimated Creatinine Clearance: 52 mL/min (by C-G formula based on SCr of 0.77 mg/dL). Recent Labs  Lab 08/30/20 1833 08/30/20 1924 08/31/20 0107 08/31/20 0603 09/01/20 0145 09/02/20 0142 09/03/20 0436 09/04/20 0609 09/05/20 0056 09/06/20 0834  PROCALCITON  --   --   --  0.68 0.79 0.50  --   --   --   --   WBC 16.9*  --   --  13.4* 11.8* 10.7* 9.7 10.7* 10.3 10.9*  LATICACIDVEN 2.4* 3.0* 2.1*  --   --   --   --   --   --   --     Liver Function Tests: Recent Labs  Lab 08/30/20 1833 08/31/20 0603  09/06/20 0834  AST 94* 85* 21  ALT 34 33 22  ALKPHOS 63 57 71  BILITOT 0.8 0.8 0.7  PROT 6.3* 5.7* 6.5  ALBUMIN 2.7* 2.3* 2.0*   No results for input(s): LIPASE, AMYLASE in the last 168 hours. No results for input(s): AMMONIA in the last 168 hours.  ABG No results found for: PHART, PCO2ART, PO2ART, HCO3, TCO2, ACIDBASEDEF, O2SAT   Coagulation Profile: Recent Labs  Lab 08/30/20 1833 08/31/20 0603  INR 1.3* 1.4*    Cardiac Enzymes: No results for input(s): CKTOTAL, CKMB, CKMBINDEX, TROPONINI in the last 168 hours.  HbA1C: No results found for: HGBA1C  CBG: Recent Labs  Lab 09/05/20 1217 09/05/20 1655 09/05/20 2039 09/06/20 0809 09/06/20 1224  GLUCAP 178* 156* 116* 158* 331*    Review of Systems:   Gen: Denies fever, chills, weight change, fatigue, night  sweats HEENT: Denies blurred vision, double vision, hearing loss, tinnitus, sinus congestion, rhinorrhea, sore throat, neck stiffness, dysphagia PULM: Denies shortness of breath, cough, sputum production, hemoptysis, wheezing CV: Denies chest pain, edema, orthopnea, paroxysmal nocturnal dyspnea, palpitations GI: Denies abdominal pain, nausea, vomiting, diarrhea, hematochezia, melena, constipation, change in bowel habits GU: Denies dysuria, hematuria, polyuria, oliguria, urethral discharge Endocrine: Denies hot or cold intolerance, polyuria, polyphagia or appetite change Derm: Denies rash, dry skin, scaling or peeling skin change Heme: Denies easy bruising, bleeding, bleeding gums Neuro: Denies headache, numbness, weakness, slurred speech, loss of memory or consciousness  Past Medical History:  She,  has a past medical history of A-fib ([place]), Anxiety, Arthritis, Depression, Diabetes mellitus without complication ([place]), Glaucoma, Hypertension, and Thyroid disease.   Surgical History:   Past Surgical History:  Procedure Laterality Date  . JOINT REPLACEMENT Right    knee replacement     Social History:    reports that she has never smoked. She has never used smokeless tobacco. She reports previous alcohol use. She reports previous drug use.   Family History:  Her family history is not on file.   Allergies Allergies  Allergen Reactions  . Nisoldipine Shortness Of Breath, Swelling and Other (See Comments)    Possibly made the body swell  . Penicillin G Shortness Of Breath, Swelling and Other (See Comments)    Body swells  . Ace Inhibitors Other (See Comments)    HYPERKALEMIA  . Eliquis [Apixaban] Other (See Comments)    Altered mental status      Home Medications  Prior to Admission medications   Medication Sig Start Date End Date Taking? Authorizing Provider  aspirin 325 MG tablet Take 325 mg by mouth at bedtime.   Yes [provider]  carvedilol (COREG) 3.125 MG tablet Take 3.125 mg by mouth 2 (two) times daily with a meal.   Yes [provider]  citalopram (CELEXA) 40 MG tablet Take 40 mg by mouth at bedtime.   Yes [provider]  clonazePAM (KLONOPIN) 2 MG tablet Take 2 mg by mouth at bedtime.   Yes [provider]  donepezil (ARICEPT) 10 MG tablet Take 10 mg by mouth at bedtime.   Yes [provider]  latanoprost (XALATAN) 0.005 % ophthalmic solution Place 1 drop into both eyes at bedtime.   Yes [provider]  levofloxacin (LEVAQUIN) 500 MG tablet Take 500 mg by mouth daily.   Yes [provider]  levothyroxine (SYNTHROID) 50 MCG tablet Take 50 mcg by mouth daily before breakfast.   Yes [provider]  metFORMIN (GLUCOPHAGE) 1000 MG tablet Take 1,000 mg by mouth 2 (two) times daily with a meal.   Yes [provider]  nortriptyline (PAMELOR) 10 MG capsule Take 20 mg by mouth at bedtime.   Yes [provider]  pantoprazole (PROTONIX) 40 MG tablet Take 40 mg by mouth daily.   Yes [provider]  psyllium (METAMUCIL) 58.6 % packet Take 1 packet by mouth at bedtime.   Yes [provider]  sulfamethoxazole-trimethoprim (BACTRIM DS) 800-160 MG tablet Take 1 tablet by mouth 2 (two) times daily.   Yes [provider]     Signature:  Johnsie Cancel, NP-C [facility] Contact / Pager information can be found on Amion  09/06/2020, 5:11 PM

## 2020-09-06 NOTE — Plan of Care (Signed)
°  Problem: Coping: °Goal: Level of anxiety will decrease °Outcome: Progressing °  °

## 2020-09-06 NOTE — Progress Notes (Signed)
Per Dr. Florene Glen, Ashlee Greer is able to receive an IV in LUA. Informed IV team RN.  New IV needed for stat CT scan.

## 2020-09-06 NOTE — Progress Notes (Signed)
EEG completed, results pending. 

## 2020-09-06 NOTE — Consult Note (Signed)
NAME:  Ashlee Greer, MRN:  [id number], DOB:  [birth year], LOS: 6 ADMISSION DATE:  08/30/2020, CONSULTATION DATE:  09/06/2020 REFERRING MD:  Dr.Powell, CHIEF COMPLAINT:  Right lung mass    Brief History:  86 year old female presents with loss of consciousness felt secondary to orthostatic hypotension. Also found to be febrile, tachyonic, and tachycardic. CT chest was obtained to further evaluate CAP which revealed 3.8 x 3.5 left hilar mass for which pulmonary was consulted.    History of Present Illness:  Ashlee Greer is a 6yp female with PMH significant for A-fib on full dose aspirin,  thyroid disease, HTN, Type II diabetes, depression, and anxety who presented to the emergency department with complaints of loss of consciousness felt secondary to orthostatic hypotension.   Of note patient was recently discharged from [facility] for TIA, UTI, and PNA. On discharge patient was treated with Bactrim for UTI and Levaquin for PNA. On discharge patients daughter who is a nurse reports continued hypotension promoting return to ED. On arrival she was seen febrile, tachyonic, tachycardiac, and with an elevated lactic meeting criteria for severe sepsis. She was admitted to hospitalist team for further management and admission   On 09/06/2020 patient received CT chest to further evaluate CAP which incidentally found a 3.8 x 3.5 left hillar mass. PCCM was consulted for further management.  Care everywhere reviewed and it appears patient has had recent aspiration event but not other chronic pulmonary disease. She is a never smoker.   Past Medical History:  HTN HLD Hypothyroid  A-fib Type II diabetes   Significant Hospital Events:  Admitted for severe sepsis 12/22  Consults:  Pulmonary   Procedures:    Significant Diagnostic Tests:  Chest CT 12/29 > 3.8 x 3.5 cm left hilar mass is noted concerning for malignancy or metastatic disease. Small left pleural effusion is noted with adjacent left  basilar subsegmental atelectasis or infiltrate.  Micro Data:  COVID 12/28 > Negative   Antimicrobials:  Cefepime 12/22 > 12/25  Flagyl 12/23 > 12/29 Azithromycin 12/25 >   Interim History / Subjective:  Lying in bed undergoing EEG Daughter at bedside and undated   Objective   Blood pressure 135/73, pulse 88, temperature 99.1 F (37.3 C), temperature source Oral, resp. rate 20, height 5\' 1"  (1.549 m), weight 88.5 kg, SpO2 93 %.        Intake/Output Summary (Last 24 hours) at 09/06/2020 1602 Last data filed at 09/06/2020 1000 Gross per 24 hour  Intake 220 ml  Output 850 ml  Net -630 ml   Filed Weights   08/30/20 1748  Weight: 88.5 kg    Examination: General: Chronically ill appearing deconditioned elderly female lying in bed in NAD HEENT: [place]/AT, MM pink/moist, PERRL,  Neuro: Lethargic will arouse to voice but unable to engage in conversation CV: s1s2 regular rate and rhythm, no murmur, rubs, or gallops,  PULM:  Clear to ascultation slightly diminished left side, remains on RA GI: soft, bowel sounds active in all 4 quadrants, non-tender, non-distended Extremities: warm/dry, no edema  Skin: no rashes or lesions  Resolved Hospital Problem list     Assessment & Plan:  Left 3.8 x 3.5 Hilar Mass  -Seen on chest CT 12/28 P: Hold Xarelto  Continue neurologic workup including MRI brain Tentatively plan for bronch with biopsy 12/31 Encourage pulmonary hygiene  Remains on RA can utilize supplemental oxygen as needed foe Sats greater than 92  Rest of acute and chronic medical conditions managed per primary  PCCM will continue to follow    Best practice (evaluated daily)  Diet: Heart healthy Pain/Anxiety/Delirium protocol (if indicated): PRN VAP protocol (if indicated): N/A DVT prophylaxis: Xarelto currently on hold  GI prophylaxis: PPI Glucose control: SSI Mobility: Bedrest  Disposition:Floor  Goals of Care:  Last date of multidisciplinary goals of care  discussion: 09/06/2020 Family and staff present: Daughter Sarah, Dr. Tamala Julian, Merlene Laughter NP Summary of discussion: Daughter agrees with continued workup to identify underlying cause of rapid decline but states her mother has a living will that states she is DNR. She states her mother did agree to intubation but she feels as if she was a bit confused when this decision was made. She and the family would support a full DNR status if the patient were to decline. For now we will continue with current workup with daily assessment of prognosis  Follow up goals of care discussion due: 09/13/2020 Code Status: LCB but if she worsens family would support transition to DNR  Labs   CBC: Recent Labs  Lab 09/02/20 0142 09/03/20 0436 09/04/20 0609 09/05/20 0056 09/06/20 0834  WBC 10.7* 9.7 10.7* 10.3 10.9*  NEUTROABS 8.4* 6.9 7.9* 7.5 8.7*  HGB 10.4* 11.2* 11.0* 11.4* 12.1  HCT 30.8* 34.2* 32.5* 32.6* 36.4  MCV 91.9 92.4 91.0 90.3 90.1  PLT 199 230 269 334 549    Basic Metabolic Panel: Recent Labs  Lab 09/01/20 0145 09/02/20 0142 09/03/20 0436 09/04/20 0609 09/05/20 0231 09/06/20 0834  NA 133* 133* 136 134* 131* 130*  K 4.1 4.0 4.5 4.8 4.4 4.3  CL 102 101 103 98 95* 95*  CO2 24 22 25 24 25 23   GLUCOSE 127* 123* 143* 132* 136* 169*  BUN 16 15 15 12 12 11   CREATININE 0.94 0.79 0.77 0.71 0.71 0.77  CALCIUM 8.4* 8.4* 8.4* 8.5* 8.3* 8.4*  MG 1.9 1.9 2.0 1.9 2.0  --    GFR: Estimated Creatinine Clearance: 52 mL/min (by C-G formula based on SCr of 0.77 mg/dL). Recent Labs  Lab 08/30/20 1833 08/30/20 1924 08/31/20 0107 08/31/20 0603 09/01/20 0145 09/02/20 0142 09/03/20 0436 09/04/20 0609 09/05/20 0056 09/06/20 0834  PROCALCITON  --   --   --  0.68 0.79 0.50  --   --   --   --   WBC 16.9*  --   --  13.4* 11.8* 10.7* 9.7 10.7* 10.3 10.9*  LATICACIDVEN 2.4* 3.0* 2.1*  --   --   --   --   --   --   --     Liver Function Tests: Recent Labs  Lab 08/30/20 1833 08/31/20 0603  09/06/20 0834  AST 94* 85* 21  ALT 34 33 22  ALKPHOS 63 57 71  BILITOT 0.8 0.8 0.7  PROT 6.3* 5.7* 6.5  ALBUMIN 2.7* 2.3* 2.0*   No results for input(s): LIPASE, AMYLASE in the last 168 hours. No results for input(s): AMMONIA in the last 168 hours.  ABG No results found for: PHART, PCO2ART, PO2ART, HCO3, TCO2, ACIDBASEDEF, O2SAT   Coagulation Profile: Recent Labs  Lab 08/30/20 1833 08/31/20 0603  INR 1.3* 1.4*    Cardiac Enzymes: No results for input(s): CKTOTAL, CKMB, CKMBINDEX, TROPONINI in the last 168 hours.  HbA1C: No results found for: HGBA1C  CBG: Recent Labs  Lab 09/05/20 1217 09/05/20 1655 09/05/20 2039 09/06/20 0809 09/06/20 1224  GLUCAP 178* 156* 116* 158* 331*    Review of Systems:   Gen: Denies fever, chills, weight change, fatigue, night  sweats HEENT: Denies blurred vision, double vision, hearing loss, tinnitus, sinus congestion, rhinorrhea, sore throat, neck stiffness, dysphagia PULM: Denies shortness of breath, cough, sputum production, hemoptysis, wheezing CV: Denies chest pain, edema, orthopnea, paroxysmal nocturnal dyspnea, palpitations GI: Denies abdominal pain, nausea, vomiting, diarrhea, hematochezia, melena, constipation, change in bowel habits GU: Denies dysuria, hematuria, polyuria, oliguria, urethral discharge Endocrine: Denies hot or cold intolerance, polyuria, polyphagia or appetite change Derm: Denies rash, dry skin, scaling or peeling skin change Heme: Denies easy bruising, bleeding, bleeding gums Neuro: Denies headache, numbness, weakness, slurred speech, loss of memory or consciousness  Past Medical History:  She,  has a past medical history of A-fib ([place]), Anxiety, Arthritis, Depression, Diabetes mellitus without complication ([place]), Glaucoma, Hypertension, and Thyroid disease.   Surgical History:   Past Surgical History:  Procedure Laterality Date  . JOINT REPLACEMENT Right    knee replacement     Social History:    reports that she has never smoked. She has never used smokeless tobacco. She reports previous alcohol use. She reports previous drug use.   Family History:  Her family history is not on file.   Allergies Allergies  Allergen Reactions  . Nisoldipine Shortness Of Breath, Swelling and Other (See Comments)    Possibly made the body swell  . Penicillin G Shortness Of Breath, Swelling and Other (See Comments)    Body swells  . Ace Inhibitors Other (See Comments)    HYPERKALEMIA  . Eliquis [Apixaban] Other (See Comments)    Altered mental status      Home Medications  Prior to Admission medications   Medication Sig Start Date End Date Taking? Authorizing Provider  aspirin 325 MG tablet Take 325 mg by mouth at bedtime.   Yes [provider]  carvedilol (COREG) 3.125 MG tablet Take 3.125 mg by mouth 2 (two) times daily with a meal.   Yes [provider]  citalopram (CELEXA) 40 MG tablet Take 40 mg by mouth at bedtime.   Yes [provider]  clonazePAM (KLONOPIN) 2 MG tablet Take 2 mg by mouth at bedtime.   Yes [provider]  donepezil (ARICEPT) 10 MG tablet Take 10 mg by mouth at bedtime.   Yes [provider]  latanoprost (XALATAN) 0.005 % ophthalmic solution Place 1 drop into both eyes at bedtime.   Yes [provider]  levofloxacin (LEVAQUIN) 500 MG tablet Take 500 mg by mouth daily.   Yes [provider]  levothyroxine (SYNTHROID) 50 MCG tablet Take 50 mcg by mouth daily before breakfast.   Yes [provider]  metFORMIN (GLUCOPHAGE) 1000 MG tablet Take 1,000 mg by mouth 2 (two) times daily with a meal.   Yes [provider]  nortriptyline (PAMELOR) 10 MG capsule Take 20 mg by mouth at bedtime.   Yes [provider]  pantoprazole (PROTONIX) 40 MG tablet Take 40 mg by mouth daily.   Yes [provider]  psyllium (METAMUCIL) 58.6 % packet Take 1 packet by mouth at bedtime.   Yes [provider]  sulfamethoxazole-trimethoprim (BACTRIM DS) 800-160 MG tablet Take 1 tablet by mouth 2 (two) times daily.   Yes [provider]     Signature:  Johnsie Cancel, NP-C [facility] Contact / Pager information can be found on Amion  09/06/2020, 5:11 PM

## 2020-09-07 ENCOUNTER — Inpatient Hospital Stay (HOSPITAL_COMMUNITY): Payer: Medicare Other

## 2020-09-07 DIAGNOSIS — R4182 Altered mental status, unspecified: Secondary | ICD-10-CM

## 2020-09-07 DIAGNOSIS — Z01818 Encounter for other preprocedural examination: Secondary | ICD-10-CM

## 2020-09-07 DIAGNOSIS — R918 Other nonspecific abnormal finding of lung field: Secondary | ICD-10-CM

## 2020-09-07 DIAGNOSIS — A419 Sepsis, unspecified organism: Secondary | ICD-10-CM | POA: Diagnosis not present

## 2020-09-07 DIAGNOSIS — R531 Weakness: Secondary | ICD-10-CM | POA: Diagnosis not present

## 2020-09-07 DIAGNOSIS — R6520 Severe sepsis without septic shock: Secondary | ICD-10-CM | POA: Diagnosis not present

## 2020-09-07 DIAGNOSIS — E871 Hypo-osmolality and hyponatremia: Secondary | ICD-10-CM

## 2020-09-07 LAB — CBC WITH DIFFERENTIAL/PLATELET
Abs Immature Granulocytes: 0.14 10*3/uL — ABNORMAL HIGH (ref 0.00–0.07)
Basophils Absolute: 0 10*3/uL (ref 0.0–0.1)
Basophils Relative: 0 %
Eosinophils Absolute: 0 10*3/uL (ref 0.0–0.5)
Eosinophils Relative: 0 %
HCT: 35.4 % — ABNORMAL LOW (ref 36.0–46.0)
Hemoglobin: 11.6 g/dL — ABNORMAL LOW (ref 12.0–15.0)
Immature Granulocytes: 1 %
Lymphocytes Relative: 13 %
Lymphs Abs: 1.5 10*3/uL (ref 0.7–4.0)
MCH: 29.9 pg (ref 26.0–34.0)
MCHC: 32.8 g/dL (ref 30.0–36.0)
MCV: 91.2 fL (ref 80.0–100.0)
Monocytes Absolute: 1.2 10*3/uL — ABNORMAL HIGH (ref 0.1–1.0)
Monocytes Relative: 10 %
Neutro Abs: 8.9 10*3/uL — ABNORMAL HIGH (ref 1.7–7.7)
Neutrophils Relative %: 76 %
Platelets: 326 10*3/uL (ref 150–400)
RBC: 3.88 MIL/uL (ref 3.87–5.11)
RDW: 15.4 % (ref 11.5–15.5)
WBC: 11.6 10*3/uL — ABNORMAL HIGH (ref 4.0–10.5)
nRBC: 0 % (ref 0.0–0.2)

## 2020-09-07 LAB — PHOSPHORUS: Phosphorus: 2.8 mg/dL (ref 2.5–4.6)

## 2020-09-07 LAB — COMPREHENSIVE METABOLIC PANEL
ALT: 17 U/L (ref 0–44)
AST: 22 U/L (ref 15–41)
Albumin: 1.8 g/dL — ABNORMAL LOW (ref 3.5–5.0)
Alkaline Phosphatase: 62 U/L (ref 38–126)
Anion gap: 10 (ref 5–15)
BUN: 12 mg/dL (ref 8–23)
CO2: 22 mmol/L (ref 22–32)
Calcium: 8.4 mg/dL — ABNORMAL LOW (ref 8.9–10.3)
Chloride: 99 mmol/L (ref 98–111)
Creatinine, Ser: 0.7 mg/dL (ref 0.44–1.00)
GFR, Estimated: >60 mL/min (ref >60)
Glucose, Bld: 144 mg/dL — ABNORMAL HIGH (ref 70–99)
Potassium: 4.3 mmol/L (ref 3.5–5.1)
Sodium: 131 mmol/L — ABNORMAL LOW (ref 135–145)
Total Bilirubin: 0.5 mg/dL (ref 0.3–1.2)
Total Protein: 6.2 g/dL — ABNORMAL LOW (ref 6.5–8.1)

## 2020-09-07 LAB — GLUCOSE, CAPILLARY
Glucose-Capillary: 161 mg/dL — ABNORMAL HIGH (ref 70–99)
Glucose-Capillary: 204 mg/dL — ABNORMAL HIGH (ref 70–99)
Glucose-Capillary: 251 mg/dL — ABNORMAL HIGH (ref 70–99)
Glucose-Capillary: 110 mg/dL — ABNORMAL HIGH (ref 70–99)

## 2020-09-07 LAB — RPR: RPR Ser Ql: NONREACTIVE

## 2020-09-07 LAB — MAGNESIUM: Magnesium: 1.9 mg/dL (ref 1.7–2.4)

## 2020-09-07 NOTE — Progress Notes (Signed)
NAME:  Ashlee Greer, MRN:  [id number], DOB:  31-Dec-1932, LOS: 7 ADMISSION DATE:  08/30/2020, CONSULTATION DATE:  09/06/2020 REFERRING MD:  Dr.Powell, CHIEF COMPLAINT:  Right lung mass    Brief History:  86 year old female presents with loss of consciousness felt secondary to orthostatic hypotension. Also found to be febrile, tachyonic, and tachycardic. CT chest was obtained to further evaluate CAP which revealed 3.8 x 3.5 left hilar mass for which pulmonary was consulted.    History of Present Illness:  Ashlee Greer is a 57yp female with PMH significant for A-fib on full dose aspirin,  thyroid disease, HTN, Type II diabetes, depression, and anxety who presented to the emergency department with complaints of loss of consciousness felt secondary to orthostatic hypotension.   Of note patient was recently discharged from [facility] for TIA, UTI, and PNA. On discharge patient was treated with Bactrim for UTI and Levaquin for PNA. On discharge patients daughter who is a nurse reports continued hypotension promoting return to ED. On arrival she was seen febrile, tachyonic, tachycardiac, and with an elevated lactic meeting criteria for severe sepsis. She was admitted to hospitalist team for further management and admission   On 09/06/2020 patient received CT chest to further evaluate CAP which incidentally found a 3.8 x 3.5 left hillar mass. PCCM was consulted for further management.  Care everywhere reviewed and it appears patient has had recent aspiration event but not other chronic pulmonary disease. She is a never smoker.   Past Medical History:  HTN HLD Hypothyroid  A-fib Type II diabetes   Significant Hospital Events:  Admitted for severe sepsis 12/22  Consults:  Pulmonary   Procedures:    Significant Diagnostic Tests:  Chest CT 12/29 > 3.8 x 3.5 cm left hilar mass is noted concerning for malignancy or metastatic disease. Small left pleural effusion is noted with adjacent left  basilar subsegmental atelectasis or infiltrate. EEG> no seizures, diffuse encephalopathy present MRI brain> no acute abnormalities, chronic small vessel ischemic changes. CT c-spine> no fractures or subluxation  Micro Data:  COVID 12/28 > Negative   Antimicrobials:  Cefepime 12/22 > 12/25  Flagyl 12/23 > 12/29 Azithromycin 12/25 >   Interim History / Subjective:  Denies complaints. Daughter at bedside.  Objective   Blood pressure (!) 144/85, pulse 99, temperature 98 F (36.7 C), temperature source Oral, resp. rate 18, height 5\' 1"  (1.549 m), weight 88.5 kg, SpO2 94 %.        Intake/Output Summary (Last 24 hours) at 09/07/2020 1503 Last data filed at 09/07/2020 9323 Gross per 24 hour  Intake 150 ml  Output 400 ml  Net -250 ml   Filed Weights   08/30/20 1748  Weight: 88.5 kg    Examination: General: chronically ill appearing woman laying in bed in NAD HEENT: Ashlee Greer, eyes anicteric  Neuro: awake, not talking much, giving very short 1 or 2-word answers CV: normal perfusion PULM:  Breathing comfortably on RA Skin: no rashes or lesions  Resolved Hospital Problem list     Assessment & Plan:  Left 3.8 x 3.5cm hilar lung mass and dependent pleural effusions in nonsmoker with failure to thrive w/ associated hyponatremia -Hold Xarelto  -Plan for bronch with biopsy 12/31 with Dr. Tamala Julian. NPO past midnight. Discussed indications, risk, benefits with daughter and patient. Daughter signed consent today and placed in the chart. -Pulmonary hygiene   Acute encephalopathy vs progressive dementia. No mass on non-contrast brain MRI. 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  Likely not severe enough to explain mentation abnormalities. -needs lung biopsy   PCCM will continue to follow    Best practice (evaluated daily)  Per primary  Goals of Care:  Last date of multidisciplinary goals of care discussion: 09/06/2020 Family and staff present: Daughter  Ashlee Greer, Dr. Tamala Julian, Merlene Laughter NP Summary of discussion: Daughter agrees with continued workup to identify underlying cause of rapid decline but states her mother has a living will that states she is DNR. She states her mother did agree to intubation but she feels as if she was a bit confused when this decision was made. She and the family would support a full DNR status if the patient were to decline. For now we will continue with current workup with daily assessment of prognosis  Follow up goals of care discussion due: 09/13/2020 Code Status: LCB but if she worsens family would support transition to DNR  Labs   CBC: Recent Labs  Lab 09/03/20 0436 09/04/20 0609 09/05/20 0056 09/06/20 0834 09/07/20 0046  WBC 9.7 10.7* 10.3 10.9* 11.6*  NEUTROABS 6.9 7.9* 7.5 8.7* 8.9*  HGB 11.2* 11.0* 11.4* 12.1 11.6*  HCT 34.2* 32.5* 32.6* 36.4 35.4*  MCV 92.4 91.0 90.3 90.1 91.2  PLT 230 269 334 350 182    Basic Metabolic Panel: Recent Labs  Lab 09/02/20 0142 09/03/20 0436 09/04/20 0609 09/05/20 0231 09/06/20 0834 09/07/20 0046  NA 133* 136 134* 131* 130* 131*  K 4.0 4.5 4.8 4.4 4.3 4.3  CL 101 103 98 95* 95* 99  CO2 22 25 24 25 23 22   GLUCOSE 123* 143* 132* 136* 169* 144*  BUN 15 15 12 12 11 12   CREATININE 0.79 0.77 0.71 0.71 0.77 0.70  CALCIUM 8.4* 8.4* 8.5* 8.3* 8.4* 8.4*  MG 1.9 2.0 1.9 2.0  --  1.9  PHOS  --   --   --   --   --  2.8   GFR: Estimated Creatinine Clearance: 52 mL/min (by C-G formula based on SCr of 0.7 mg/dL). Recent Labs  Lab 09/01/20 0145 09/02/20 0142 09/03/20 0436 09/04/20 0609 09/05/20 0056 09/06/20 0834 09/07/20 0046  PROCALCITON 0.79 0.50  --   --   --   --   --   WBC 11.8* 10.7*   < > 10.7* 10.3 10.9* 11.6*   < > = values in this interval not displayed.    Liver Function Tests: Recent Labs  Lab 09/06/20 0834 09/07/20 0046  AST 21 22  ALT 22 17  ALKPHOS 71 62  BILITOT 0.7 0.5  PROT 6.5 6.2*  ALBUMIN 2.0* 1.8*   No results for input(s):  LIPASE, AMYLASE in the last 168 hours. Recent Labs  Lab 09/06/20 1802  AMMONIA 33    ABG No results found for: PHART, PCO2ART, PO2ART, HCO3, TCO2, ACIDBASEDEF, O2SAT   Coagulation Profile: No results for input(s): INR, PROTIME in the last 168 hours.  Cardiac Enzymes: No results for input(s): CKTOTAL, CKMB, CKMBINDEX, TROPONINI in the last 168 hours.  HbA1C: Hgb A1c MFr Bld  Date/Time Value Ref Range Status  09/06/2020 06:02 PM 6.8 (H) 4.8 - 5.6 % Final    Comment:    (NOTE) Pre diabetes:          5.7%-6.4%  Diabetes:              >6.4%  Glycemic control for   <7.0% adults with diabetes     CBG: Recent Labs  Lab 09/06/20 1224 09/06/20 1736 09/06/20 2108 09/07/20  0729 09/07/20 [place] Lilliahna Schubring, DO 09/07/20 3:49 PM [facility]

## 2020-09-07 NOTE — Progress Notes (Signed)
Physical Therapy Treatment Patient Details Name: Ashlee Greer MRN: [id number] DOB: [birth year] Today's Date: 09/07/2020    History of Present Illness Ashlee Greer is a 86 y.o. female with medical history significant of A. fib not on anticoagulation, anxiety/depression, arthritis, diabetes, glaucoma, hypertension, hypothyroidism who presents with recurrent loss of consciousness secondary to orthostatic hypotension. Patient was recently discharged from [facility] after treatment for TIA, UTI, pneumonia.  She also had some fatigue and orthostasis while she was there. She was recommended to go to an SNF at discharge but family is an Therapist, sports and thought they could handle her at home.  Family states that however protested her discharged and got an extra day because of this prior to discharge.  However, after returning home, her blood pressures consistently dropped on standing and has gotten so bad that she had episodes of syncope while working with PT.    PT Comments    Pt supine on arrival, agreeable to therapy session but with increased apparent confusion/word finding difficulty and limited due to pain (pt has difficulty localizing pain but seems to be increased in B feet and hands/wrists, RN notified). Pt performed AAROM therapeutic exercises with BUE/BLE for strengthening and pressure relief, deferred EOB transfer due to pt increased pain with all arm/leg movements and lack of +2 assist. Pt performed rolling to R side for pillow placement to offload sacrum with +1 totalA and LUE elevated on pillow at end of session due to L hand/wrist edema. Pt continues to benefit from skilled rehab in a post acute setting to maximize functional gains before returning home.   Follow Up Recommendations  SNF;Supervision/Assistance - 24 hour     Equipment Recommendations  Other (comment) (defer to post acute rehab)    Recommendations for Other Services OT consult     Precautions / Restrictions  Precautions Precautions: Fall Restrictions Weight Bearing Restrictions: No Other Position/Activity Restrictions:  (chronic RUE fx per dtr, no WB restrictions noted in chart)    Mobility  Bed Mobility Overal bed mobility: Needs Assistance Bed Mobility: Rolling Rolling: Total assist         General bed mobility comments: pt needs max cues and tactile assist for cross-body reaching but ultimately unable to reach opposite bed rail to assist with pulling her body onto side; totalA with transfer pad assist to roll to R for pillow placement to offload sacrum  Transfers                 General transfer comment: deferred EOB due to pt confusion/lack of +2 assist  Ambulation/Gait                 Stairs             Wheelchair Mobility    Modified Rankin (Stroke Patients Only)       Balance                                            Cognition Arousal/Alertness: Awake/alert Behavior During Therapy: WFL for tasks assessed/performed;Anxious Overall Cognitive Status: Impaired/Different from baseline Area of Impairment: Memory;Following commands;Awareness;Problem solving;Orientation                 Orientation Level: Disoriented to;Place;Time;Situation   Memory: Decreased short-term memory Following Commands: Follows one step commands with increased time;Follows one step commands inconsistently   Awareness: Emergent Problem Solving: Slow processing;Decreased initiation;Difficulty  sequencing;Requires verbal cues;Requires tactile cues General Comments: pt with more word finding difficulty and not seeming to be oriented to place/situation this session, RN notified (per chart review this has been waxing/waning), pt anxious regarding all mobility and c/o pain with all movements      Exercises General Exercises - Upper Extremity Shoulder Flexion: AAROM;Strengthening;Both;10 reps;Supine Elbow Flexion: AAROM;Strengthening;Both;10  reps;Supine Elbow Extension: AAROM;Both;10 reps;Supine Wrist Flexion: AAROM;Left;10 reps;Supine (gentle AAROM within tolerance; pain limiting) Wrist Extension: AAROM;Left;10 reps;Supine (gentle AAROM within tolerance) General Exercises - Lower Extremity Ankle Circles/Pumps: Both;10 reps;AAROM;Supine Short Arc Quad: AAROM;Both;10 reps;Supine Heel Slides: Both;10 reps;AAROM;Supine Hip ABduction/ADduction: Both;10 reps;AAROM;Supine    General Comments General comments (skin integrity, edema, etc.): pt with significant bruising around LUE wrist/dorsal hand (likely from previous IV site) and increased edema, pillow placed under L forearm to elevate at end of session and pillow behind L lower back to offload pressure, RN notified to switch in 1-2 hours      Pertinent Vitals/Pain Pain Assessment: Faces Faces Pain Scale: Hurts even more Pain Location: B ankles/feet, B wrists/hands with AAROM Pain Descriptors / Indicators: Grimacing;Moaning;Guarding Pain Intervention(s): Limited activity within patient's tolerance;Monitored during session;Repositioned (RN notified)    Home Living                      Prior Function            PT Goals (current goals can now be found in the care plan section) Acute Rehab PT Goals Patient Stated Goal: to get stronger PT Goal Formulation: With patient/family Time For Goal Achievement: 09/15/20 Potential to Achieve Goals: Fair Progress towards PT goals: Not progressing toward goals - comment (very deconditioned and pain limited; functional decline vs cognition?)    Frequency    Min 2X/week      PT Plan Current plan remains appropriate    Co-evaluation              AM-PAC PT "6 Clicks" Mobility   Outcome Measure  Help needed turning from your back to your side while in a flat bed without using bedrails?: Total Help needed moving from lying on your back to sitting on the side of a flat bed without using bedrails?: Total Help needed  moving to and from a bed to a chair (including a wheelchair)?: Total Help needed standing up from a chair using your arms (e.g., wheelchair or bedside chair)?: Total Help needed to walk in hospital room?: Total Help needed climbing 3-5 steps with a railing? : Total 6 Click Score: 6    End of Session   Activity Tolerance: Patient limited by pain Patient left: in bed;with call bell/phone within reach;with bed alarm set;with SCD's reapplied (neuro MD entering room to speak with pt) Nurse Communication: Mobility status PT Visit Diagnosis: Unsteadiness on feet (R26.81);Other abnormalities of gait and mobility (R26.89);Muscle weakness (generalized) (M62.81);Difficulty in walking, not elsewhere classified (R26.2)     Time: 6256-3893 PT Time Calculation (min) (ACUTE ONLY): 24 min  Charges:  $Therapeutic Exercise: 8-22 mins $Therapeutic Activity: 8-22 mins                     Siobhan Zaro P., PTA Acute Rehabilitation Services Pager: [phone] Office: [place] 09/07/2020, 6:05 PM

## 2020-09-07 NOTE — Consult Note (Addendum)
Neurology Consultation  Reason for Consult: Acute bilateral lower extremity weakness  Referring Physician: Dr. Florene Glen  CC: New onset bilateral lower leg weakness  History is obtained from: chart review, patient unable to give clear and coherent history, and no family at bedside during initial assessment  HPI: Ashlee Greer is a 86 y.o. female with a medical history significant for atrial fibrillation on 325 mg aspirin, thyroid disease, hypertension, type 2 diabetes mellitus, depression, and anxiety who presented to the ED 12/22 for loss of consciousness that was thought to be secondary to orthostatic hypotension. On presentation, she was found to be severely septic and a CT chest was obtained revealing a 3.8 x 3.5 left hilar mass.  She was recently discharged from [facility] after treatment for TIA, UTI, PNA.  She also had some fatigue and orthostasis while she was there. She was discharged on Bactrim for UTI and Levaquin for her PNA. She was recommended to go to an SNF at discharge but her daughter is a Marine scientist and wanted to care for the patient at home. However, her BP remained low prior to discharge which concerned family. Due to ongoing ortho hypotension on returning home, family brought patient to the hospital.     - Patient with improvement on antibiotic therapy until 12/29 when was noted to have an acute mental status change with orientation to self only and slow responses. Only 2 weeks prior to hospitalization, patient was living independently. An EEG was obtained with evidence of diffuse encephalopathy and MRI brain with chronic small vessel ischemic changes for evaluation of acute encephalopathy versus progressive dementia for declining mental status. CT C-spine without fractures or subluxation.  -12/30 Neurology was consulted for concerns of lower extremity weakness  ROS: Unable to obtain due to altered mental status.   Past Medical History:  Diagnosis Date  . A-fib ([place])   .  Anxiety   . Arthritis   . Depression   . Diabetes mellitus without complication ([place])   . Glaucoma   . Hypertension   . Thyroid disease    hypo    No family history on file.   Social History:   reports that she has never smoked. She has never used smokeless tobacco. She reports previous alcohol use. She reports previous drug use. * Medications  Current Facility-Administered Medications:  .  acetaminophen (TYLENOL) tablet 650 mg, 650 mg, Oral, Q6H PRN, 650 mg at 09/07/20 0835 **OR** acetaminophen (TYLENOL) suppository 650 mg, 650 mg, Rectal, Q6H PRN, Marcelyn Bruins, MD .  carvedilol (COREG) tablet 3.125 mg, 3.125 mg, Oral, BID WC, Marcelyn Bruins, MD, 3.125 mg at 09/07/20 0834 .  citalopram (CELEXA) tablet 20 mg, 20 mg, Oral, QHS, Dwyane Dee, MD, 20 mg at 09/06/20 2154 .  clonazePAM (KLONOPIN) disintegrating tablet 0.25 mg, 0.25 mg, Oral, QHS, Elodia Florence., MD, 0.25 mg at 09/06/20 2154 .  donepezil (ARICEPT) tablet 10 mg, 10 mg, Oral, QHS, Dwyane Dee, MD, 10 mg at 09/06/20 2154 .  insulin aspart (novoLOG) injection 0-15 Units, 0-15 Units, Subcutaneous, TID WC, Marcelyn Bruins, MD, 5 Units at 09/07/20 1353 .  latanoprost (XALATAN) 0.005 % ophthalmic solution 1 drop, 1 drop, Both Eyes, QHS, Dwyane Dee, MD, 1 drop at 09/06/20 2154 .  levothyroxine (SYNTHROID) tablet 50 mcg, 50 mcg, Oral, QAC breakfast, Marcelyn Bruins, MD, 50 mcg at 09/07/20 0555 .  menthol-cetylpyridinium (CEPACOL) lozenge 3 mg, 1 lozenge, Oral, PRN, Dwyane Dee, MD .  pantoprazole (PROTONIX) EC tablet 40  mg, 40 mg, Oral, Daily, Dwyane Dee, MD, 40 mg at 09/07/20 1055 .  phenol (CHLORASEPTIC) mouth spray 1 spray, 1 spray, Mouth/Throat, PRN, Girguis, David, MD .  polyethylene glycol (MIRALAX / GLYCOLAX) packet 17 g, 17 g, Oral, Daily PRN, Marcelyn Bruins, MD, 17 g at 09/04/20 1358 .  sodium chloride flush (NS) 0.9 % injection 3 mL, 3 mL, Intravenous, Q12H, Marcelyn Bruins, MD, 3 mL at 09/06/20 2155   Exam: Current vital signs: BP 127/84 (BP Location: Left Wrist)   Pulse (!) 104   Temp 99 F (37.2 C) (Axillary)   Resp 18   Ht 5\' 1"  (1.549 m)   Wt 88.5 kg   SpO2 93%   BMI 36.84 kg/m  Vital signs in last 24 hours: Temp:  [98 F (36.7 C)-99 F (37.2 C)] 99 F (37.2 C) (12/30 1529) Pulse Rate:  [88-104] 104 (12/30 1529) Resp:  [18-20] 18 (12/30 1529) BP: (124-144)/(72-85) 127/84 (12/30 1529) SpO2:  [92 %-94 %] 93 % (12/30 1529)  GENERAL: Awake but with decreased interactivity HEENT: - [place]/AT LUNGS - Respirations unlabored  NEURO:  Mental Status: Awake with bradyphrenia. Speech is slow with increased latency of responses. Slow to move to command and slow to make eye contact. Little spontaneous movement. Speech in this context is fluent with intact comprehension for simple commands.  Cranial Nerves: PERRL. Fixates and tracks slowly. EOMI. Face symmetric. Phonation intact.  Motor: Poor effort x 4 with apparent BUE and BLE weakness initially. The BLE weakness improves with repeated coaching and BUE also are with poor effort which can be improved with coaching. There is a trend towards greater weakness of her LLE relative to the right however. Also with pain in BLE and RUE as well as neck with passive ROM assessment. She has increased tone possibly due to guarding x 4. Waxy rigidity is noted with passive ROM.  Sensory: Moves to FT x 4 Reflexes: Low amplitude reflexes throughout Cerebellar: Severe bradykinesia when assessing, but without ataxia Gait- Unable to assess   Labs I have reviewed labs in epic and the results pertinent to this consultation are:  CBC    Component Value Date/Time   WBC 11.6 (H) 09/07/2020 0046   RBC 3.88 09/07/2020 0046   HGB 11.6 (L) 09/07/2020 0046   HCT 35.4 (L) 09/07/2020 0046   PLT 326 09/07/2020 0046   MCV 91.2 09/07/2020 0046   MCH 29.9 09/07/2020 0046   MCHC 32.8 09/07/2020 0046   RDW 15.4 09/07/2020 0046    LYMPHSABS 1.5 09/07/2020 0046   MONOABS 1.2 (H) 09/07/2020 0046   EOSABS 0.0 09/07/2020 0046   BASOSABS 0.0 09/07/2020 0046    CMP     Component Value Date/Time   NA 131 (L) 09/07/2020 0046   K 4.3 09/07/2020 0046   CL 99 09/07/2020 0046   CO2 22 09/07/2020 0046   GLUCOSE 144 (H) 09/07/2020 0046   BUN 12 09/07/2020 0046   CREATININE 0.70 09/07/2020 0046   CALCIUM 8.4 (L) 09/07/2020 0046   PROT 6.2 (L) 09/07/2020 0046   ALBUMIN 1.8 (L) 09/07/2020 0046   AST 22 09/07/2020 0046   ALT 17 09/07/2020 0046   ALKPHOS 62 09/07/2020 0046   BILITOT 0.5 09/07/2020 0046   GFRNONAA >60 09/07/2020 0046    Lipid Panel  No results found for: CHOL, TRIG, HDL, CHOLHDL, VLDL, LDLCALC, LDLDIRECT   Assessment: 86 year old female admitted with sepsis. Initially improved then decompensated with AMS and BLE weakness 1.  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 The BLE weakness improves with repeated coaching and BUE also are with poor effort which can be improved with coaching. There is a trend towards greater weakness of her LLE relative to the right however. Also with pain in BLE and RUE as well as neck with passive ROM assessment. She has increased tone possibly due to guarding x 4. Waxy rigidity is noted with passive ROM.  2. MRI brain 12/29:No acute intracranial abnormality. Moderate chronic small vessel ischemic changes and generalized volume loss.  Images personally reviewed.  3. Vitamin B12 level is normal. AST, ALT and ammonia are normal. BUN/CR normal. Calcium unremarkable. Na slightly low but does not explain presentation. TSH normal in the setting of levothyroxine supplementation. RPR non-reactive.  4. EEG is suggestive of mild diffuse encephalopathy, nonspecific to etiology. No seizures or epileptiform discharges were seen throughout the recording.   Recommendations: 1. Continue Celexa, clonazepam and Aricept 2. MRI of thoracic and lumbar spine to  assess for possible spinal stenosis involving the spinal cord or cauda equina.  3. Avoid antipsychotics 4. Trial of Ambien qAM x 3 days. Ambien in this case would not be used for sleep. Ambien has the paradoxical effect of reversing catatonia in some cases. IV Ativan may also have this effect.  5. Mg level.   I have seen and examined the patient. I have formulated the assessment and plan. Neurology NP assisted with documentation of the HPI.   Electronically signed: Dr. Kerney Elbe   .

## 2020-09-07 NOTE — Procedures (Signed)
Patient Name: Ashlee Greer  MRN: [id number]  Epilepsy Attending: Lora Havens  Referring Physician/Provider: Dr Fayrene Helper Date: 09/06/2020 Duration: 24.03 mins  Patient history: 86yo F with ams. EEG to evaluate for seizure  Level of alertness: Awake  AEDs during EEG study: Clonazapem  Technical aspects: This EEG study was done with scalp electrodes positioned according to the 10-20 International system of electrode placement. Electrical activity was acquired at a sampling rate of 500Hz  and reviewed with a high frequency filter of 70Hz  and a low frequency filter of 1Hz . EEG data were recorded continuously and digitally stored.   Description: The posterior dominant rhythm consists of 7 Hz activity of moderate voltage (25-35 uV) seen predominantly in posterior head regions, symmetric and reactive to eye opening and eye closing. EEG showed continuous generalized 6-7 Hz theta slowing.Hyperventilation and photic stimulation were not performed.     ABNORMALITY -Continuous slow, generalized - Background slow  IMPRESSION: This study is suggestive of mild diffuse encephalopathy, nonspecific etiology. No seizures or epileptiform discharges were seen throughout the recording.   Luciana Cammarata Barbra Sarks

## 2020-09-07 NOTE — Progress Notes (Signed)
Orthopedic Tech Progress Note Patient Details:  Ashlee Greer Nov 06, 1932 [id number]  Ortho Devices Type of Ortho Device: Velcro wrist splint Ortho Device/Splint Location: RUE Ortho Device/Splint Interventions: Ordered,Application,Adjustment   Post Interventions Patient Tolerated: Fair Instructions Provided: Care of device,Adjustment of device,Poper ambulation with device   [name] 09/07/2020, 6:03 PM

## 2020-09-07 NOTE — Progress Notes (Signed)
PROGRESS NOTE    Ashlee Greer  XJO:[id number] DOB: [birth year] DOA: 08/30/2020 PCP: [name], MD  Chief Complaint  Patient presents with   Loss of Consciousness   Brief Narrative:  Ashlee Greer Ashlee Greer 86 y.o.femalewith medical history significant ofA. fib not on anticoagulation, anxiety/depression, arthritis, diabetes, glaucoma, hypertension, hypothyroidism who presented with recurrent loss of consciousness secondary to orthostatic hypotension.  Patient was recently discharged from [facility] after treatment for TIA, UTI, pneumonia. She also had some fatigue and orthostasis while she was there. She was discharged on Bactrim for UTI and Levaquin for her pneumonia. She was recommended to go to an SNF at discharge but family is an Therapist, sports and stated they would care for patient at home.  However, her BP remained low prior to discharge which concerned family.  Due to ongoing ortho hypoTN on returning home, family brought patient to the hospital.    Workup in the ER was notable for temp 101.4, tachypnea, tachycardia. CXR showed left basilar infiltrate vs atelectasis.  Lactic was elevated and she was started on IVF and cefepime. Covid and flu swab negative.  She continued to improve with antibiotics. She was evaluated by physical therapy and recommended for SNF rehab at discharge.  This was discussed with her family who was also in agreement.  Assessment & Plan:   Active Problems:   History of TIA (transient ischemic attack)   PAF (paroxysmal atrial fibrillation) (HCC)   Anxiety and depression   Diabetes (HCC)   Hypothyroidism   Hand pain, right  Severe sepsis 2/2 Community Acquired Pneumonia (HCC)-resolved as of 09/04/2020 - now clinically improved on IV abx; okay to change to Azithro (continue flagyl) and will complete course (d/c cefepime) - follow up cultures: negative to date - finishing out 7 day course total   3.8 x 3.5 cm L hilar Mass      - she's completed  course of abx as noted above      - will consult pulmonology for further recommendations -> planning for bronch with bx 41/58  Acute Metabolic Encephalopathy       - she's Ashlee Greer&Ox3 today, improved, per daughter, she was independent 2 weeks ago at home and has daily decline        - MRI at 12/16 at [facility] was negative for acute events       - head CT today without acute findings       - will get MRI brain (no acute intracranial abnormality, moderate chronic small vessel ischemic changes and generalized volume loss) and EEG (mild diffuse encephalopathy, nonspecific)       - TSH wnl, follow ammonia (wnl), B12 (wnl), RPR (nonreactive)  Lower Extremity Weakness       - ? If related to general decline, encephalopathy, but she has marked LE weakness, unable to lift R heel off bed and just able to clear the L leg - with hx independence 2 weeks ago, will discuss with neurology regarding need for additional w/u  Orthostatic hypotension-resolved as of 09/02/2020 -Also possibly multifactorial in setting of infection as well as polypharmacy.  Her medications reviewed and she is on sedating medications including clonazepam as well as anticholinergic mechanisms with nortriptyline which will be held; Previous MD has also counseled her bedside that her clonazepam dose needs to be reduced at discharge and ideally would benefit from weaning off of it altogether (continue to taper given her AMS) -resume celexa at lower dose, 20 mg daily on 12/25 -Continue monitoring  blood pressure which is improving -Repeat orthostatic blood pressure: negative on 12/24  History of Fall       - occurred prior to [facility] hospitalization       - ? If related to R wrist pain (see below).  She has neck pain, will get cervical imaging (no fracture or subluxation)      - she's complaining of pain limiting lower extremity strength today -> bilateral hip and R knee film without acute finding (remote proximal fibula fx, tricompartment OA  and chondrocalcinosis)  History of TIA (transient ischemic attack) - see afib - never received records from [facility] and depression -See orthostatic hypotension -Clonazepam dose significantly reduced and holding nortriptyline -Celexa on hold but will try to resume soon  PAF (paroxysmal atrial fibrillation) ([place]) -Discussed bedside with patient; Eliquis made her confused in the past.  Denies any significant bleeding events.  She is open to alternative anticoagulation -Discussed that we would trial Xarelto (started 12/27) -Continue Coreg  Acute lower UTI-resolved as of 09/04/2020 -She does endorse some symptoms.  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  Consultants:   pulm  Procedures:  none  Antimicrobials:  Anti-infectives (From admission, onward)   Start     Dose/Rate Route Frequency Ordered Stop   09/02/20 1215  azithromycin (ZITHROMAX) tablet 500 mg        500 mg Oral Daily 09/02/20 1122 09/07/20 1055   08/31/20 1615  metroNIDAZOLE (FLAGYL) tablet 500 mg  Status:  Discontinued        500 mg Oral Every 8 hours 08/31/20 1608 09/06/20  1654   08/30/20 2100  ceFEPIme (MAXIPIME) 2 g in sodium chloride 0.9 % 100 mL IVPB  Status:  Discontinued        2 g 200 mL/hr over 30 Minutes Intravenous Every 12 hours 08/30/20 2011 09/02/20 1122     Subjective: Kynlie Jane&Ox3 today No new complaints  Objective: Vitals:   09/06/20 1339 09/06/20 2031 09/07/20 0341 09/07/20 1529  BP: 135/73 124/72 (!) 144/85 127/84  Pulse: 88 88 99 (!) 104  Resp: 20 20 18 18   Temp: 99.1 F (37.3 C) 99 F (37.2 C) 98 F (36.7 C) 99 F (37.2 C)  TempSrc: Oral Oral Oral Axillary  SpO2: 93% 92% 94% 93%  Weight:      Height:        Intake/Output Summary (Last 24 hours) at 09/07/2020 1823 Last data filed at 09/07/2020 1300 Gross per 24 hour  Intake 450 ml  Output 400 ml  Net 50 ml   Filed Weights   08/30/20 1748  Weight: 88.5 kg    Examination:  General: No acute distress. Cardiovascular: Heart sounds show Gibson Lad regular rate, and rhythm.  Lungs: Clear to auscultation bilaterally Abdomen: Soft, nontender, nondistended  Neurological: Alert and oriented 3.  Bilateral LE weakness, 3/5 on L and 2/5 on R.  Antigravity to upper extremities, 4/5, RUE limited due to pain in hand. Cranial nerves II through XII grossly intact. Skin: Warm and dry. No rashes or lesions. Extremities: No clubbing or cyanosis. No edema.    Data Reviewed: I have personally reviewed following labs and imaging studies  CBC: Recent Labs  Lab 09/03/20 0436 09/04/20 0609 09/05/20 0056 09/06/20 0834 09/07/20 0046  WBC 9.7 10.7* 10.3 10.9* 11.6*  NEUTROABS 6.9 7.9* 7.5 8.7* 8.9*  HGB 11.2* 11.0* 11.4* 12.1 11.6*  HCT 34.2* 32.5* 32.6* 36.4 35.4*  MCV 92.4 91.0 90.3 90.1 91.2  PLT 230 269 334 350 233    Basic Metabolic Panel: Recent Labs  Lab 09/02/20 0142 09/03/20 0436 09/04/20 0609 09/05/20 0231 09/06/20 0834 09/07/20 0046  NA 133* 136 134* 131* 130* 131*  K 4.0 4.5 4.8 4.4 4.3 4.3  CL 101 103 98 95* 95* 99  CO2 22 25 24 25 23 22   GLUCOSE 123* 143* 132* 136*  169* 144*  BUN 15 15 12 12 11 12   CREATININE 0.79 0.77 0.71 0.71 0.77 0.70  CALCIUM 8.4* 8.4* 8.5* 8.3* 8.4* 8.4*  MG 1.9 2.0 1.9 2.0  --  1.9  PHOS  --   --   --   --   --  2.8    GFR: Estimated Creatinine Clearance: 52 mL/min (by C-G formula based on SCr of 0.7 mg/dL).  Liver Function Tests: Recent Labs  Lab 09/06/20 0834 09/07/20 0046  AST 21 22  ALT 22 17  ALKPHOS 71 62  BILITOT 0.7 0.5  PROT 6.5 6.2*  ALBUMIN 2.0* 1.8*    CBG: Recent Labs  Lab 09/06/20 1736 09/06/20 2108 09/07/20 0729 09/07/20 1248 09/07/20 1644  GLUCAP 116* 251* 161* 204* 251*     Recent Results (from the past 240 hour(s))  Blood culture (routine single)     Status: None   Collection Time: 08/30/20  6:33 PM   Specimen: BLOOD  Result Value Ref Range Status   Specimen Description BLOOD BLOOD RIGHT FOREARM  Final   Special Requests   Final    BOTTLES DRAWN AEROBIC ONLY Blood Culture results may not be optimal due to an inadequate volume of blood received in culture bottles   Culture   Final    NO GROWTH 5 DAYS Performed at [facility], [place] [address] [place], [place] [address]    Report Status 09/04/2020 FINAL  Final  Resp Panel by RT-PCR (Flu Shelbylynn Walczyk&B, Covid) Nasopharyngeal Swab     Status: None   Collection Time: 08/30/20  6:33 PM   Specimen: Nasopharyngeal Swab; Nasopharyngeal(NP) swabs in vial transport medium  Result Value Ref Range Status   SARS Coronavirus 2 by RT PCR NEGATIVE NEGATIVE Final    Comment: (NOTE) SARS-CoV-2 target nucleic acids are NOT DETECTED.  The SARS-CoV-2 RNA is generally detectable in upper respiratory specimens during the acute phase of infection. The lowest concentration of SARS-CoV-2 viral copies this assay can detect is 138 copies/mL. Addeline Calarco negative result does not preclude SARS-Cov-2 infection and should not be used as the sole basis for treatment or other patient management  decisions. Levoy Geisen negative result may occur with  improper specimen  collection/handling, submission of specimen other than nasopharyngeal swab, presence of viral mutation(s) within the areas targeted by this assay, and inadequate number of viral copies(<138 copies/mL). Fransisco Messmer negative result must be combined with clinical observations, patient history, and epidemiological information. The expected result is Negative.  Fact Sheet for Patients:  EntrepreneurPulse.com.au  Fact Sheet for Healthcare Providers:  IncredibleEmployment.be  This test is no t yet approved or cleared by the [place] FDA and  has been authorized for detection and/or diagnosis of SARS-CoV-2 by FDA under an Emergency Use Authorization (EUA). This EUA will remain  in effect (meaning this test can be used) for the duration of the COVID-19 declaration under Section 564(b)(1) of the Act, 21 U.S.C.section 360bbb-3(b)(1), unless the authorization is terminated  or revoked sooner.       Influenza Rudie Sermons by PCR NEGATIVE NEGATIVE Final   Influenza B by PCR NEGATIVE NEGATIVE Final    Comment: (NOTE) The Xpert Xpress SARS-CoV-2/FLU/RSV plus assay is intended as an aid in the diagnosis of influenza from Nasopharyngeal swab specimens and should not be used as Willett Lefeber sole basis for treatment. Nasal washings and aspirates are unacceptable for Xpert Xpress SARS-CoV-2/FLU/RSV testing.  Fact Sheet for Patients: EntrepreneurPulse.com.au  Fact Sheet for Healthcare Providers: IncredibleEmployment.be  This test is not yet approved or cleared by the [place] FDA and has been authorized for detection and/or diagnosis of SARS-CoV-2 by FDA under an Emergency Use Authorization (EUA). This EUA will remain in effect (meaning this test can be used) for the duration of the COVID-19 declaration under Section 564(b)(1) of the Act, 21 U.S.C. section 360bbb-3(b)(1), unless the authorization is terminated or revoked.  Performed at [facility], [place] [address], [place], [place] [address]   Culture, blood (single)     Status: None   Collection Time: 08/30/20  8:25 PM   Specimen: BLOOD RIGHT HAND  Result Value Ref Range Status   Specimen Description BLOOD RIGHT HAND  Final   Special Requests   Final    BOTTLES DRAWN AEROBIC AND ANAEROBIC Blood Culture results may not be optimal due to an inadequate volume of blood received in culture bottles   Culture   Final    NO GROWTH 5 DAYS Performed at [facility], [place] [address] [place], [place] [address]    Report Status 09/04/2020 FINAL  Final  MRSA PCR Screening     Status: None   Collection Time: 08/31/20  1:07 AM   Specimen: Nasal Mucosa; Nasopharyngeal  Result Value Ref Range Status   MRSA by PCR NEGATIVE NEGATIVE Final    Comment:        The GeneXpert MRSA Assay (FDA approved for NASAL specimens only), is one component of Nakyah Erdmann comprehensive MRSA colonization surveillance program. It is not intended to diagnose MRSA infection nor to guide or monitor treatment for MRSA infections. Performed at [facility], [place] [address] [place], [place] [address]   Respiratory Panel by PCR     Status: None   Collection Time: 09/01/20  1:48 PM   Specimen: Nasopharyngeal Swab; Respiratory  Result Value Ref Range Status   Adenovirus NOT DETECTED NOT DETECTED Final   Coronavirus 229E NOT DETECTED NOT DETECTED Final    Comment: (NOTE) The Coronavirus on the Respiratory Panel, DOES NOT test for the novel  Coronavirus (2019 nCoV)    Coronavirus HKU1 NOT DETECTED NOT DETECTED Final   Coronavirus NL63 NOT  DETECTED NOT DETECTED Final   Coronavirus OC43 NOT DETECTED NOT DETECTED Final   Metapneumovirus NOT DETECTED NOT DETECTED Final   Rhinovirus / Enterovirus NOT DETECTED NOT DETECTED Final   Influenza Micaela Stith NOT DETECTED NOT DETECTED Final   Influenza B NOT DETECTED NOT DETECTED Final   Parainfluenza Virus 1 NOT DETECTED NOT DETECTED Final   Parainfluenza Virus 2 NOT  DETECTED NOT DETECTED Final   Parainfluenza Virus 3 NOT DETECTED NOT DETECTED Final   Parainfluenza Virus 4 NOT DETECTED NOT DETECTED Final   Respiratory Syncytial Virus NOT DETECTED NOT DETECTED Final   Bordetella pertussis NOT DETECTED NOT DETECTED Final   Bordetella Parapertussis NOT DETECTED NOT DETECTED Final   Chlamydophila pneumoniae NOT DETECTED NOT DETECTED Final   Mycoplasma pneumoniae NOT DETECTED NOT DETECTED Final    Comment: Performed at [facility], [place] [address] [place], [place] [address]  Resp Panel by RT-PCR (Flu Manahil Vanzile&B, Covid) Nasopharyngeal Swab     Status: None   Collection Time: 09/05/20  4:47 PM   Specimen: Nasopharyngeal Swab; Nasopharyngeal(NP) swabs in vial transport medium  Result Value Ref Range Status   SARS Coronavirus 2 by RT PCR NEGATIVE NEGATIVE Final    Comment: (NOTE) SARS-CoV-2 target nucleic acids are NOT DETECTED.  The SARS-CoV-2 RNA is generally detectable in upper respiratory specimens during the acute phase of infection. The lowest concentration of SARS-CoV-2 viral copies this assay can detect is 138 copies/mL. Ulyssa Walthour negative result does not preclude SARS-Cov-2 infection and should not be used as the sole basis for treatment or other patient management decisions. Katlynn Naser negative result may occur with  improper specimen collection/handling, submission of specimen other than nasopharyngeal swab, presence of viral mutation(s) within the areas targeted by this assay, and inadequate number of viral copies(<138 copies/mL). Charmika Macdonnell negative result must be combined with clinical observations, patient history, and epidemiological information. The expected result is Negative.  Fact Sheet for Patients:  EntrepreneurPulse.com.au  Fact Sheet for Healthcare Providers:  IncredibleEmployment.be  This test is no t yet approved or cleared by the [place] FDA and  has been authorized for detection and/or diagnosis of SARS-CoV-2  by FDA under an Emergency Use Authorization (EUA). This EUA will remain  in effect (meaning this test can be used) for the duration of the COVID-19 declaration under Section 564(b)(1) of the Act, 21 U.S.C.section 360bbb-3(b)(1), unless the authorization is terminated  or revoked sooner.       Influenza Caide Campi by PCR NEGATIVE NEGATIVE Final   Influenza B by PCR NEGATIVE NEGATIVE Final    Comment: (NOTE) The Xpert Xpress SARS-CoV-2/FLU/RSV plus assay is intended as an aid in the diagnosis of influenza from Nasopharyngeal swab specimens and should not be used as Habeeb Puertas sole basis for treatment. Nasal washings and aspirates are unacceptable for Xpert Xpress SARS-CoV-2/FLU/RSV testing.  Fact Sheet for Patients: EntrepreneurPulse.com.au  Fact Sheet for Healthcare Providers: IncredibleEmployment.be  This test is not yet approved or cleared by the [place] FDA and has been authorized for detection and/or diagnosis of SARS-CoV-2 by FDA under an Emergency Use Authorization (EUA). This EUA will remain in effect (meaning this test can be used) for the duration of the COVID-19 declaration under Section 564(b)(1) of the Act, 21 U.S.C. section 360bbb-3(b)(1), unless the authorization is terminated or revoked.  Performed at [facility], [place] [address] [place], [place] [address]          Radiology Studies: EEG  Result Date: 09/07/2020 Lora Havens, MD  09/07/2020  7:42 AM Patient Name: MAKYNLIE ROSSINI MRN: [id number] Epilepsy Attending: Lora Havens Referring Physician/Provider: Dr Fayrene Helper Date: 09/06/2020 Duration: 24.03 mins Patient history: 86yo F with ams. EEG to evaluate for seizure Level of alertness: Awake AEDs during EEG study: Clonazapem Technical aspects: This EEG study was done with scalp electrodes positioned according to the 10-20 International system of electrode placement. Electrical activity was acquired at Briella Hobday sampling  rate of 500Hz  and reviewed with Ayanna Gheen high frequency filter of 70Hz  and Gracelyn Coventry low frequency filter of 1Hz . EEG data were recorded continuously and digitally stored. Description: The posterior dominant rhythm consists of 7 Hz activity of moderate voltage (25-35 uV) seen predominantly in posterior head regions, symmetric and reactive to eye opening and eye closing. EEG showed continuous generalized 6-7 Hz theta slowing.Hyperventilation and photic stimulation were not performed.   ABNORMALITY -Continuous slow, generalized - Background slow IMPRESSION: This study is suggestive of mild diffuse encephalopathy, nonspecific etiology. No seizures or epileptiform discharges were seen throughout the recording. Lora Havens   DG Knee 1-2 Views Right  Result Date: 09/07/2020 CLINICAL DATA:  Fall. EXAM: RIGHT KNEE - 1-2 VIEW COMPARISON:  None. FINDINGS: Previous hardware fixation of the lateral tibial plateau. Remote fracture deformity of the proximal fibula noted. No joint effusion identified. Loose body within the suprapatellar joint space measures 8 mm. Moderate tricompartment osteoarthritis is noted with joint space narrowing and marginal spur formation. Chondrocalcinosis identified. IMPRESSION: 1. No acute findings. 2. Remote fracture deformity of the proximal fibula. 3. Moderate tricompartment osteoarthritis and chondrocalcinosis. Electronically Signed   By: Kerby Moors M.D.   On: 09/07/2020 12:08   CT HEAD WO CONTRAST  Result Date: 09/06/2020 CLINICAL DATA:  [place] EXAM: CT HEAD WITHOUT CONTRAST TECHNIQUE: Contiguous axial images were obtained from the base of the skull through the vertex without intravenous contrast. COMPARISON:  08/24/2020 FINDINGS: Brain: No evidence of acute infarction, hemorrhage, extra-axial collection, ventriculomegaly, or mass effect. Generalized cerebral atrophy. Periventricular white matter low attenuation likely secondary to microangiopathy. Vascular: Cerebrovascular atherosclerotic  calcifications are noted. Skull: Negative for fracture or focal lesion. Sinuses/Orbits: Visualized portions of the orbits are unremarkable. Visualized portions of the paranasal sinuses are unremarkable. Visualized portions of the mastoid air cells are unremarkable. Other: None. IMPRESSION: 1. No acute intracranial pathology. 2. Chronic microvascular disease and cerebral atrophy. Electronically Signed   By: Kathreen Devoid   On: 09/06/2020 13:50   CT CHEST W CONTRAST  Result Date: 09/06/2020 CLINICAL DATA:  Respiratory failure. EXAM: CT CHEST WITH CONTRAST TECHNIQUE: Multidetector CT imaging of the chest was performed during intravenous contrast administration. CONTRAST:  27mL OMNIPAQUE IOHEXOL 300 MG/ML  SOLN COMPARISON:  Radiograph of same day. FINDINGS: Cardiovascular: Atherosclerosis of thoracic aorta is noted without aneurysm or dissection. Mild cardiomegaly is noted. No pericardial effusion is noted. Mediastinum/Nodes: Thyroid gland and esophagus are unremarkable. 3.8 x 3.5 cm left hilar mass is noted concerning for malignancy or metastatic disease. Lungs/Pleura: No pneumothorax is noted. Minimal right posterior basilar subsegmental atelectasis is noted. Small left pleural effusion is noted with adjacent left basilar subsegmental atelectasis or infiltrate. Upper Abdomen: No acute abnormality. Musculoskeletal: No chest wall abnormality. No acute or significant osseous findings. IMPRESSION: 1. 3.8 x 3.5 cm left hilar mass is noted concerning for malignancy or metastatic disease. PET scan is recommended for further evaluation. 2. Small left pleural effusion is noted with adjacent left basilar subsegmental atelectasis or infiltrate. 3. Aortic atherosclerosis. Aortic Atherosclerosis (ICD10-I70.0). Electronically Signed   By: Jeneen Rinks  Murlean Caller M.D.   On: 09/06/2020 13:49   CT CERVICAL SPINE WO CONTRAST  Result Date: 09/07/2020 CLINICAL DATA:  [place]. EXAM: CT CERVICAL SPINE WITHOUT CONTRAST TECHNIQUE:  Multidetector CT imaging of the cervical spine was performed without intravenous contrast. Multiplanar CT image reconstructions were also generated. COMPARISON:  Cervical spine MRI dated 09/07/2020 FINDINGS: Alignment: Mild anterolisthesis at the C6-7 and C7-T1 levels. This is unchanged at the C7-T1 level since 11/19/2005. Skull base and vertebrae: No acute fracture. No primary bone lesion or focal pathologic process. Soft tissues and spinal canal: No prevertebral fluid or swelling. No visible canal hematoma. Disc levels: Multilevel degenerative changes, including facet degenerative changes throughout the cervical spine. Upper chest: Clear lung apices. Other: Right carotid artery atheromatous calcifications and minimal left carotid artery atheromatous calcifications. IMPRESSION: 1. No fracture or traumatic subluxation. 2. Multilevel degenerative changes, as described above. 3. Right carotid artery atheromatous calcifications and minimal left carotid artery atheromatous calcifications. Electronically Signed   By: Claudie Revering M.D.   On: 09/07/2020 12:19   MR BRAIN WO CONTRAST  Result Date: 09/06/2020 CLINICAL DATA:  Encephalopathy EXAM: MRI HEAD WITHOUT CONTRAST TECHNIQUE: Multiplanar, multiecho pulse sequences of the brain and surrounding structures were obtained without intravenous contrast. COMPARISON:  08/24/2020 FINDINGS: Brain: No acute infarct, mass effect or extra-axial collection. No acute or chronic hemorrhage. Hyperintense T2-weighted signal is moderately widespread throughout the white matter. Generalized volume loss without Tionne Dayhoff clear lobar predilection. The midline structures are normal. Vascular: Major flow voids are preserved. Skull and upper cervical spine: Normal calvarium and skull base. Visualized upper cervical spine and soft tissues are normal. Sinuses/Orbits:No paranasal sinus fluid levels or advanced mucosal thickening. No mastoid or middle ear effusion. Normal orbits. IMPRESSION: 1. No  acute intracranial abnormality. 2. Moderate chronic small vessel ischemic changes and generalized volume loss. Electronically Signed   By: Ulyses Jarred M.D.   On: 09/06/2020 20:26   DG CHEST PORT 1 VIEW  Addendum Date: 09/06/2020   ADDENDUM REPORT: 09/06/2020 11:03 ADDENDUM: Findings of mass effect in the LEFT lower lobe upon bronchial structures were discussed with the referring physician as outlined below. Findings are more likely due to worsening of pulmonary parenchymal process. CT of the chest was however suggested to further evaluate this finding given rapid change and to exclude the possibility of acute vascular process related to the aorta given the location. These results were called by telephone at the time of interpretation on 09/06/2020 at 11:03 am to provider Jerra Huckeby Cephus Slater , who verbally acknowledged these results. Electronically Signed   By: Zetta Bills M.D.   On: 09/06/2020 11:03   Result Date: 09/06/2020 CLINICAL DATA:  Cough, hypotension atrial fibrillation EXAM: PORTABLE CHEST 1 VIEW COMPARISON:  August 30, 2020 FINDINGS: Trachea midline. The LEFT lower lobe bronchus is splayed atop an area measuring approximately 8.4 x 6.5 cm with some peripheral airspace disease noted in the LEFT lower lobe with obscured LEFT hemidiaphragm. Airspace process more confluent than on the prior study, likely associated with small effusion in the LEFT lung base. RIGHT chest is clear. No acute skeletal process. IMPRESSION: Mass effect along the LEFT retrocardiac region up lifting the LEFT mainstem and lower lobe bronchi with some lower lobe bronchial narrowing suggested, associated with airspace disease and effusion. Findings are suspicious first and foremost for developing pulmonary abscess or worsening pneumonia though there is no air-fluid level seen on the current study. Vascular etiology and aneurysm also considered given the appearance though felt less likely. Would  suggest contrasted CT of the chest  for further evaluation given the rapid change in the appearance of the LEFT lower lobe since the previous study. Nikisha Fleece call is out to the referring provider to further discuss findings in the above case. Electronically Signed: By: Zetta Bills M.D. On: 09/06/2020 10:30   DG HIP UNILAT WITH PELVIS 2-3 VIEWS LEFT  Result Date: 09/07/2020 CLINICAL DATA:  Bilateral hip pain after Daemon Dowty fall.  Initial encounter. EXAM: DG HIP (WITH OR WITHOUT PELVIS) 2-3V LEFT; DG HIP (WITH OR WITHOUT PELVIS) 2-3V RIGHT COMPARISON:  Plain films of the hips 08/26/2020. FINDINGS: No acute bony or joint abnormality is seen. 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 IMPRESSION: No acute abnormality. Electronically Signed   By: Inge Rise M.D.   On: 09/07/2020 12:09   DG HIP UNILAT WITH PELVIS 2-3 VIEWS RIGHT  Result Date: 09/07/2020 CLINICAL DATA:  Bilateral hip pain after Torrin Frein fall.  Initial encounter. EXAM: DG HIP (WITH OR WITHOUT PELVIS) 2-3V LEFT; DG HIP (WITH OR WITHOUT PELVIS) 2-3V RIGHT COMPARISON:  Plain films of the hips 08/26/2020. FINDINGS: No acute bony or joint abnormality is seen. 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 IMPRESSION: No acute abnormality. Electronically Signed   By: Inge Rise M.D.   On: 09/07/2020 12:09        Scheduled Meds:  carvedilol  3.125 mg Oral BID WC   citalopram  20 mg Oral QHS   clonazepam  0.25 mg Oral QHS   donepezil  10 mg Oral QHS   insulin aspart  0-15 Units Subcutaneous TID WC   latanoprost  1 drop Both Eyes QHS   levothyroxine  50 mcg Oral QAC breakfast   pantoprazole  40 mg Oral Daily   sodium chloride flush  3 mL Intravenous Q12H   Continuous Infusions:   LOS: 7 days    Time spent: over 30 min    Fayrene Helper, MD [facility]   To contact the attending provider between 7A-7P or the covering provider during after hours 7P-7A, please log into the web site [email or web address] and access using universal [place]  password for that web site. If you do not have the password, please call the hospital operator.  09/07/2020, 6:23 PM

## 2020-09-08 ENCOUNTER — Inpatient Hospital Stay (HOSPITAL_COMMUNITY): Payer: Medicare Other

## 2020-09-08 ENCOUNTER — Encounter (HOSPITAL_COMMUNITY): Payer: Self-pay | Admitting: Internal Medicine

## 2020-09-08 DIAGNOSIS — A419 Sepsis, unspecified organism: Secondary | ICD-10-CM | POA: Diagnosis not present

## 2020-09-08 DIAGNOSIS — R531 Weakness: Secondary | ICD-10-CM | POA: Diagnosis not present

## 2020-09-08 DIAGNOSIS — R6520 Severe sepsis without septic shock: Secondary | ICD-10-CM | POA: Diagnosis not present

## 2020-09-08 LAB — GLUCOSE, CAPILLARY
Glucose-Capillary: 150 mg/dL — ABNORMAL HIGH (ref 70–99)
Glucose-Capillary: 167 mg/dL — ABNORMAL HIGH (ref 70–99)
Glucose-Capillary: 211 mg/dL — ABNORMAL HIGH (ref 70–99)
Glucose-Capillary: 270 mg/dL — ABNORMAL HIGH (ref 70–99)

## 2020-09-08 LAB — COMPREHENSIVE METABOLIC PANEL
ALT: 19 U/L (ref 0–44)
AST: 27 U/L (ref 15–41)
Albumin: 1.8 g/dL — ABNORMAL LOW (ref 3.5–5.0)
Alkaline Phosphatase: 65 U/L (ref 38–126)
Anion gap: 12 (ref 5–15)
BUN: 18 mg/dL (ref 8–23)
CO2: 23 mmol/L (ref 22–32)
Calcium: 8 mg/dL — ABNORMAL LOW (ref 8.9–10.3)
Chloride: 98 mmol/L (ref 98–111)
Creatinine, Ser: 0.75 mg/dL (ref 0.44–1.00)
GFR, Estimated: >60 mL/min (ref >60)
Glucose, Bld: 151 mg/dL — ABNORMAL HIGH (ref 70–99)
Potassium: 4.6 mmol/L (ref 3.5–5.1)
Sodium: 133 mmol/L — ABNORMAL LOW (ref 135–145)
Total Bilirubin: 0.4 mg/dL (ref 0.3–1.2)
Total Protein: 6.1 g/dL — ABNORMAL LOW (ref 6.5–8.1)

## 2020-09-08 LAB — CBC WITH DIFFERENTIAL/PLATELET
Abs Immature Granulocytes: 0.1 10*3/uL — ABNORMAL HIGH (ref 0.00–0.07)
Basophils Absolute: 0 10*3/uL (ref 0.0–0.1)
Basophils Relative: 0 %
Eosinophils Absolute: 0 10*3/uL (ref 0.0–0.5)
Eosinophils Relative: 0 %
HCT: 35.8 % — ABNORMAL LOW (ref 36.0–46.0)
Hemoglobin: 11.5 g/dL — ABNORMAL LOW (ref 12.0–15.0)
Immature Granulocytes: 1 %
Lymphocytes Relative: 7 %
Lymphs Abs: 1 10*3/uL (ref 0.7–4.0)
MCH: 29.9 pg (ref 26.0–34.0)
MCHC: 32.1 g/dL (ref 30.0–36.0)
MCV: 93 fL (ref 80.0–100.0)
Monocytes Absolute: 0.7 10*3/uL (ref 0.1–1.0)
Monocytes Relative: 5 %
Neutro Abs: 12 10*3/uL — ABNORMAL HIGH (ref 1.7–7.7)
Neutrophils Relative %: 87 %
Platelets: 339 10*3/uL (ref 150–400)
RBC: 3.85 MIL/uL — ABNORMAL LOW (ref 3.87–5.11)
RDW: 15.6 % — ABNORMAL HIGH (ref 11.5–15.5)
WBC: 13.8 10*3/uL — ABNORMAL HIGH (ref 4.0–10.5)
nRBC: 0 % (ref 0.0–0.2)

## 2020-09-08 LAB — MAGNESIUM: Magnesium: 1.9 mg/dL (ref 1.7–2.4)

## 2020-09-08 MED ORDER — LIDOCAINE 2% (20 MG/ML) 5 ML SYRINGE
INTRAMUSCULAR | Status: AC
  Filled 2020-09-08: qty 5

## 2020-09-08 MED ORDER — FENTANYL CITRATE (PF) 250 MCG/5ML IJ SOLN
INTRAMUSCULAR | Status: AC
  Filled 2020-09-08: qty 5

## 2020-09-08 MED ORDER — ZOLPIDEM TARTRATE 5 MG PO TABS: 5.0000 mg | ORAL_TABLET | Freq: Every morning | ORAL | Status: DC

## 2020-09-08 MED ORDER — ZOLPIDEM TARTRATE 5 MG PO TABS
5.0000 mg | ORAL_TABLET | Freq: Every day | ORAL | Status: AC
  Administered 2020-09-08 – 2020-09-12 (×5): 5 mg via ORAL
  Filled 2020-09-08 (×6): qty 1

## 2020-09-08 MED ORDER — PROPOFOL 10 MG/ML IV BOLUS
INTRAVENOUS | Status: AC
  Filled 2020-09-08: qty 40

## 2020-09-08 MED ORDER — ZOLPIDEM TARTRATE 5 MG PO TABS: 10.0000 mg | ORAL_TABLET | Freq: Every morning | ORAL | Status: DC

## 2020-09-08 MED ORDER — ROCURONIUM BROMIDE 10 MG/ML (PF) SYRINGE
PREFILLED_SYRINGE | INTRAVENOUS | Status: AC
  Filled 2020-09-08: qty 10

## 2020-09-08 MED ORDER — ZOLPIDEM TARTRATE 5 MG PO TABS: 5.0000 mg | ORAL_TABLET | Freq: Every day | ORAL | Status: DC

## 2020-09-08 NOTE — Progress Notes (Addendum)
Subjective: Continues to lie in bed with little movement.   Objective: Current vital signs: BP 126/85 (BP Location: Left Wrist)   Pulse 97   Temp 98 F (36.7 C) (Oral)   Resp 17   Ht 5\' 1"  (1.549 m)   Wt 88.5 kg   SpO2 94%   BMI 36.84 kg/m  Vital signs in last 24 hours: Temp:  [97.8 F (36.6 C)-98.7 F (37.1 C)] 98 F (36.7 C) (12/31 1925) Pulse Rate:  [90-100] 97 (12/31 1925) Resp:  [16-17] 17 (12/31 1925) BP: (126-130)/(80-88) 126/85 (12/31 1925) SpO2:  [93 %-96 %] 94 % (12/31 1925)  Intake/Output from previous day: 12/30 0701 - 12/31 0700 In: 450 [P.O.:450] Out: 800 [Urine:800] Intake/Output this shift: No intake/output data recorded. Nutritional status:  Diet Order            Diet NPO time specified  Diet effective midnight           Diet Heart Room service appropriate? Yes; Fluid consistency: Thin  Diet effective now                 GENERAL: Awake, with decreased interactivity, but somewhat more engaged with exam and answering questions than yesterday HEENT: - [place]/AT LUNGS - Respirations unlabored  NEURO:  Mental Status: Awake with bradyphrenia. Speech is slow with increased latency of responses - this is somewhat improved since yesterday. Slow to move to command and slow to make eye contact - however, this is improved since yesterday. Little spontaneous movement. Speech in this context is fluent with intact comprehension for simple commands.  Cranial Nerves: Fixates and tracks slowly. EOMI. Face symmetric with decreased facial expressivity/flat affect. Phonation intact.  Motor: Poor effort x 4 with apparent BUE and BLE weakness initially. The BLE weakness improves with repeated coaching and BUE also are with poor effort which can be improved with coaching. There is a trend towards greater weakness of her RLE relative to the left however. Also with pain in BLE and RUE with passive ROM assessment. She has increased tone possibly due to guarding x 4. Waxy rigidity  is noted with passive ROM, which is unchanged since yesterday.  Sensory: Moves to FT x 4 Reflexes: Low amplitude reflexes throughout Cerebellar: Severe bradykinesia when assessing, but without ataxia Gait- Unable to assess  Lab Results: Results for orders placed or performed during the hospital encounter of 08/30/20 (from the past 48 hour(s))  Glucose, capillary     Status: Abnormal   Collection Time: 09/06/20  9:08 PM  Result Value Ref Range   Glucose-Capillary 251 (H) 70 - 99 mg/dL    Comment: Glucose reference range applies only to samples taken after fasting for at least 8 hours.  CBC with Differential/Platelet     Status: Abnormal   Collection Time: 09/07/20 12:46 AM  Result Value Ref Range   WBC 11.6 (H) 4.0 - 10.5 K/uL   RBC 3.88 3.87 - 5.11 MIL/uL   Hemoglobin 11.6 (L) 12.0 - 15.0 g/dL   HCT 35.4 (L) 36.0 - 46.0 %   MCV 91.2 80.0 - 100.0 fL   MCH 29.9 26.0 - 34.0 pg   MCHC 32.8 30.0 - 36.0 g/dL   RDW 15.4 11.5 - 15.5 %   Platelets 326 150 - 400 K/uL   nRBC 0.0 0.0 - 0.2 %   Neutrophils Relative % 76 %   Neutro Abs 8.9 (H) 1.7 - 7.7 K/uL   Lymphocytes Relative 13 %   Lymphs Abs 1.5 0.7 -  4.0 K/uL   Monocytes Relative 10 %   Monocytes Absolute 1.2 (H) 0.1 - 1.0 K/uL   Eosinophils Relative 0 %   Eosinophils Absolute 0.0 0.0 - 0.5 K/uL   Basophils Relative 0 %   Basophils Absolute 0.0 0.0 - 0.1 K/uL   Immature Granulocytes 1 %   Abs Immature Granulocytes 0.14 (H) 0.00 - 0.07 K/uL    Comment: Performed at [facility] [address] [place], [place] [address]  Comprehensive metabolic panel     Status: Abnormal   Collection Time: 09/07/20 12:46 AM  Result Value Ref Range   Sodium 131 (L) 135 - 145 mmol/L   Potassium 4.3 3.5 - 5.1 mmol/L   Chloride 99 98 - 111 mmol/L   CO2 22 22 - 32 mmol/L   Glucose, Bld 144 (H) 70 - 99 mg/dL    Comment: Glucose reference range applies only to samples taken after fasting for at least 8 hours.   BUN 12 8 - 23 mg/dL    Creatinine, Ser 0.70 0.44 - 1.00 mg/dL   Calcium 8.4 (L) 8.9 - 10.3 mg/dL   Total Protein 6.2 (L) 6.5 - 8.1 g/dL   Albumin 1.8 (L) 3.5 - 5.0 g/dL   AST 22 15 - 41 U/L   ALT 17 0 - 44 U/L   Alkaline Phosphatase 62 38 - 126 U/L   Total Bilirubin 0.5 0.3 - 1.2 mg/dL   GFR, Estimated >60 >60 mL/min    Comment: (NOTE) Calculated using the CKD-EPI Creatinine Equation (2021)    Anion gap 10 5 - 15    Comment: Performed at [facility], [place] [address] [place], [place] [address]  Magnesium     Status: None   Collection Time: 09/07/20 12:46 AM  Result Value Ref Range   Magnesium 1.9 1.7 - 2.4 mg/dL    Comment: Performed at [facility] [address], [place], [place] [address]  Phosphorus     Status: None   Collection Time: 09/07/20 12:46 AM  Result Value Ref Range   Phosphorus 2.8 2.5 - 4.6 mg/dL    Comment: Performed at [facility] [address] [place], [place] [address]  Glucose, capillary     Status: Abnormal   Collection Time: 09/07/20  7:29 AM  Result Value Ref Range   Glucose-Capillary 161 (H) 70 - 99 mg/dL    Comment: Glucose reference range applies only to samples taken after fasting for at least 8 hours.  Glucose, capillary     Status: Abnormal   Collection Time: 09/07/20 12:48 PM  Result Value Ref Range   Glucose-Capillary 204 (H) 70 - 99 mg/dL    Comment: Glucose reference range applies only to samples taken after fasting for at least 8 hours.  Glucose, capillary     Status: Abnormal   Collection Time: 09/07/20  4:44 PM  Result Value Ref Range   Glucose-Capillary 251 (H) 70 - 99 mg/dL    Comment: Glucose reference range applies only to samples taken after fasting for at least 8 hours.  Glucose, capillary     Status: Abnormal   Collection Time: 09/07/20  9:32 PM  Result Value Ref Range   Glucose-Capillary 110 (H) 70 - 99 mg/dL    Comment: Glucose reference range applies only to samples taken after fasting for at least 8 hours.  Magnesium      Status: None   Collection Time: 09/08/20  1:00 AM  Result Value  Ref Range   Magnesium 1.9 1.7 - 2.4 mg/dL    Comment: Performed at [name] [address] [place], [place] [address]  Glucose, capillary     Status: Abnormal   Collection Time: 09/08/20  7:37 AM  Result Value Ref Range   Glucose-Capillary 150 (H) 70 - 99 mg/dL    Comment: Glucose reference range applies only to samples taken after fasting for at least 8 hours.  Comprehensive metabolic panel     Status: Abnormal   Collection Time: 09/08/20  8:53 AM  Result Value Ref Range   Sodium 133 (L) 135 - 145 mmol/L   Potassium 4.6 3.5 - 5.1 mmol/L   Chloride 98 98 - 111 mmol/L   CO2 23 22 - 32 mmol/L   Glucose, Bld 151 (H) 70 - 99 mg/dL    Comment: Glucose reference range applies only to samples taken after fasting for at least 8 hours.   BUN 18 8 - 23 mg/dL   Creatinine, Ser 0.75 0.44 - 1.00 mg/dL   Calcium 8.0 (L) 8.9 - 10.3 mg/dL   Total Protein 6.1 (L) 6.5 - 8.1 g/dL   Albumin 1.8 (L) 3.5 - 5.0 g/dL   AST 27 15 - 41 U/L   ALT 19 0 - 44 U/L   Alkaline Phosphatase 65 38 - 126 U/L   Total Bilirubin 0.4 0.3 - 1.2 mg/dL   GFR, Estimated >60 >60 mL/min    Comment: (NOTE) Calculated using the CKD-EPI Creatinine Equation (2021)    Anion gap 12 5 - 15    Comment: Performed at [facility], [place] [address], [place], [place] [address]  Glucose, capillary     Status: Abnormal   Collection Time: 09/08/20 12:00 PM  Result Value Ref Range   Glucose-Capillary 167 (H) 70 - 99 mg/dL    Comment: Glucose reference range applies only to samples taken after fasting for at least 8 hours.  CBC with Differential/Platelet     Status: Abnormal   Collection Time: 09/08/20  2:37 PM  Result Value Ref Range   WBC 13.8 (H) 4.0 - 10.5 K/uL   RBC 3.85 (L) 3.87 - 5.11 MIL/uL   Hemoglobin 11.5 (L) 12.0 - 15.0 g/dL   HCT 35.8 (L) 36.0 - 46.0 %   MCV 93.0 80.0 - 100.0 fL   MCH 29.9 26.0 - 34.0 pg   MCHC 32.1 30.0 - 36.0 g/dL   RDW  15.6 (H) 11.5 - 15.5 %   Platelets 339 150 - 400 K/uL   nRBC 0.0 0.0 - 0.2 %   Neutrophils Relative % 87 %   Neutro Abs 12.0 (H) 1.7 - 7.7 K/uL   Lymphocytes Relative 7 %   Lymphs Abs 1.0 0.7 - 4.0 K/uL   Monocytes Relative 5 %   Monocytes Absolute 0.7 0.1 - 1.0 K/uL   Eosinophils Relative 0 %   Eosinophils Absolute 0.0 0.0 - 0.5 K/uL   Basophils Relative 0 %   Basophils Absolute 0.0 0.0 - 0.1 K/uL   Immature Granulocytes 1 %   Abs Immature Granulocytes 0.10 (H) 0.00 - 0.07 K/uL    Comment: Performed at [facility], [address] [place], [place] [address]  Glucose, capillary     Status: Abnormal   Collection Time: 09/08/20  4:31 PM  Result Value Ref Range   Glucose-Capillary 211 (H) 70 - 99 mg/dL    Comment: Glucose reference range applies only to samples taken after fasting  for at least 8 hours.    Recent Results (from the past 240 hour(s))  Blood culture (routine single)     Status: None   Collection Time: 08/30/20  6:33 PM   Specimen: BLOOD  Result Value Ref Range Status   Specimen Description BLOOD BLOOD RIGHT FOREARM  Final   Special Requests   Final    BOTTLES DRAWN AEROBIC ONLY Blood Culture results may not be optimal due to an inadequate volume of blood received in culture bottles   Culture   Final    NO GROWTH 5 DAYS Performed at [facility], [place] [address] [place], [place] [address]    Report Status 09/04/2020 FINAL  Final  Resp Panel by RT-PCR (Flu A&B, Covid) Nasopharyngeal Swab     Status: None   Collection Time: 08/30/20  6:33 PM   Specimen: Nasopharyngeal Swab; Nasopharyngeal(NP) swabs in vial transport medium  Result Value Ref Range Status   SARS Coronavirus 2 by RT PCR NEGATIVE NEGATIVE Final    Comment: (NOTE) SARS-CoV-2 target nucleic acids are NOT DETECTED.  The SARS-CoV-2 RNA is generally detectable in upper respiratory specimens during the acute phase of infection. The lowest concentration of SARS-CoV-2 viral copies this assay can  detect is 138 copies/mL. A negative result does not preclude SARS-Cov-2 infection and should not be used as the sole basis for treatment or other patient management decisions. A negative result may occur with  improper specimen collection/handling, submission of specimen other than nasopharyngeal swab, presence of viral mutation(s) within the areas targeted by this assay, and inadequate number of viral copies(<138 copies/mL). A negative result must be combined with clinical observations, patient history, and epidemiological information. The expected result is Negative.  Fact Sheet for Patients:  EntrepreneurPulse.com.au  Fact Sheet for Healthcare Providers:  IncredibleEmployment.be  This test is no t yet approved or cleared by the [place] FDA and  has been authorized for detection and/or diagnosis of SARS-CoV-2 by FDA under an Emergency Use Authorization (EUA). This EUA will remain  in effect (meaning this test can be used) for the duration of the COVID-19 declaration under Section 564(b)(1) of the Act, 21 U.S.C.section 360bbb-3(b)(1), unless the authorization is terminated  or revoked sooner.       Influenza A by PCR NEGATIVE NEGATIVE Final   Influenza B by PCR NEGATIVE NEGATIVE Final    Comment: (NOTE) The Xpert Xpress SARS-CoV-2/FLU/RSV plus assay is intended as an aid in the diagnosis of influenza from Nasopharyngeal swab specimens and should not be used as a sole basis for treatment. Nasal washings and aspirates are unacceptable for Xpert Xpress SARS-CoV-2/FLU/RSV testing.  Fact Sheet for Patients: EntrepreneurPulse.com.au  Fact Sheet for Healthcare Providers: IncredibleEmployment.be  This test is not yet approved or cleared by the [place] FDA and has been authorized for detection and/or diagnosis of SARS-CoV-2 by FDA under an Emergency Use Authorization (EUA). This EUA will remain in  effect (meaning this test can be used) for the duration of the COVID-19 declaration under Section 564(b)(1) of the Act, 21 U.S.C. section 360bbb-3(b)(1), unless the authorization is terminated or revoked.  Performed at [facility], [place] [address], [place],  [address]   Culture, blood (single)     Status: None   Collection Time: 08/30/20  8:25 PM   Specimen: BLOOD RIGHT HAND  Result Value Ref Range Status   Specimen Description BLOOD RIGHT HAND  Final   Special Requests   Final    BOTTLES DRAWN AEROBIC  AND ANAEROBIC Blood Culture results may not be optimal due to an inadequate volume of blood received in culture bottles   Culture   Final    NO GROWTH 5 DAYS Performed at [facility] [address], [place], [place] [address]    Report Status 09/04/2020 FINAL  Final  MRSA PCR Screening     Status: None   Collection Time: 08/31/20  1:07 AM   Specimen: Nasal Mucosa; Nasopharyngeal  Result Value Ref Range Status   MRSA by PCR NEGATIVE NEGATIVE Final    Comment:        The GeneXpert MRSA Assay (FDA approved for NASAL specimens only), is one component of a comprehensive MRSA colonization surveillance program. It is not intended to diagnose MRSA infection nor to guide or monitor treatment for MRSA infections. Performed at [facility], [place] [address] [place], [place] [address]   Respiratory Panel by PCR     Status: None   Collection Time: 09/01/20  1:48 PM   Specimen: Nasopharyngeal Swab; Respiratory  Result Value Ref Range Status   Adenovirus NOT DETECTED NOT DETECTED Final   Coronavirus 229E NOT DETECTED NOT DETECTED Final    Comment: (NOTE) The Coronavirus on the Respiratory Panel, DOES NOT test for the novel  Coronavirus (2019 nCoV)    Coronavirus HKU1 NOT DETECTED NOT DETECTED Final   Coronavirus NL63 NOT DETECTED NOT DETECTED Final   Coronavirus OC43 NOT DETECTED NOT DETECTED Final   Metapneumovirus NOT DETECTED NOT DETECTED Final    Rhinovirus / Enterovirus NOT DETECTED NOT DETECTED Final   Influenza A NOT DETECTED NOT DETECTED Final   Influenza B NOT DETECTED NOT DETECTED Final   Parainfluenza Virus 1 NOT DETECTED NOT DETECTED Final   Parainfluenza Virus 2 NOT DETECTED NOT DETECTED Final   Parainfluenza Virus 3 NOT DETECTED NOT DETECTED Final   Parainfluenza Virus 4 NOT DETECTED NOT DETECTED Final   Respiratory Syncytial Virus NOT DETECTED NOT DETECTED Final   Bordetella pertussis NOT DETECTED NOT DETECTED Final   Bordetella Parapertussis NOT DETECTED NOT DETECTED Final   Chlamydophila pneumoniae NOT DETECTED NOT DETECTED Final   Mycoplasma pneumoniae NOT DETECTED NOT DETECTED Final    Comment: Performed at [facility], [place]. [address] [place], [place] [address]  Resp Panel by RT-PCR (Flu A&B, Covid) Nasopharyngeal Swab     Status: None   Collection Time: 09/05/20  4:47 PM   Specimen: Nasopharyngeal Swab; Nasopharyngeal(NP) swabs in vial transport medium  Result Value Ref Range Status   SARS Coronavirus 2 by RT PCR NEGATIVE NEGATIVE Final    Comment: (NOTE) SARS-CoV-2 target nucleic acids are NOT DETECTED.  The SARS-CoV-2 RNA is generally detectable in upper respiratory specimens during the acute phase of infection. The lowest concentration of SARS-CoV-2 viral copies this assay can detect is 138 copies/mL. A negative result does not preclude SARS-Cov-2 infection and should not be used as the sole basis for treatment or other patient management decisions. A negative result may occur with  improper specimen collection/handling, submission of specimen other than nasopharyngeal swab, presence of viral mutation(s) within the areas targeted by this assay, and inadequate number of viral copies(<138 copies/mL). A negative result must be combined with clinical observations, patient history, and epidemiological information. The expected result is Negative.  Fact Sheet for Patients:   EntrepreneurPulse.com.au  Fact Sheet for Healthcare Providers:  IncredibleEmployment.be  This test is no t yet approved or cleared by the [place] FDA and  has been  authorized for detection and/or diagnosis of SARS-CoV-2 by FDA under an Emergency Use Authorization (EUA). This EUA will remain  in effect (meaning this test can be used) for the duration of the COVID-19 declaration under Section 564(b)(1) of the Act, 21 U.S.C.section 360bbb-3(b)(1), unless the authorization is terminated  or revoked sooner.       Influenza A by PCR NEGATIVE NEGATIVE Final   Influenza B by PCR NEGATIVE NEGATIVE Final    Comment: (NOTE) The Xpert Xpress SARS-CoV-2/FLU/RSV plus assay is intended as an aid in the diagnosis of influenza from Nasopharyngeal swab specimens and should not be used as a sole basis for treatment. Nasal washings and aspirates are unacceptable for Xpert Xpress SARS-CoV-2/FLU/RSV testing.  Fact Sheet for Patients: EntrepreneurPulse.com.au  Fact Sheet for Healthcare Providers: IncredibleEmployment.be  This test is not yet approved or cleared by the [place] FDA and has been authorized for detection and/or diagnosis of SARS-CoV-2 by FDA under an Emergency Use Authorization (EUA). This EUA will remain in effect (meaning this test can be used) for the duration of the COVID-19 declaration under Section 564(b)(1) of the Act, 21 U.S.C. section 360bbb-3(b)(1), unless the authorization is terminated or revoked.  Performed at [facility], [place] [address], [place], [place] [address]     Lipid Panel No results for input(s): CHOL, TRIG, HDL, CHOLHDL, VLDL, LDLCALC in the last 72 hours.  Studies/Results: EEG  Result Date: 09/07/2020 Lora Havens, MD     09/07/2020  7:42 AM Patient Name: Ashlee Greer MRN: [id number] Epilepsy Attending: Lora Havens Referring Physician/Provider: Dr  Fayrene Helper Date: 09/06/2020 Duration: 24.03 mins Patient history: 86yo F with ams. EEG to evaluate for seizure Level of alertness: Awake AEDs during EEG study: Clonazapem Technical aspects: This EEG study was done with scalp electrodes positioned according to the 10-20 International system of electrode placement. Electrical activity was acquired at a sampling rate of 500Hz  and reviewed with a high frequency filter of 70Hz  and a low frequency filter of 1Hz . EEG data were recorded continuously and digitally stored. Description: The posterior dominant rhythm consists of 7 Hz activity of moderate voltage (25-35 uV) seen predominantly in posterior head regions, symmetric and reactive to eye opening and eye closing. EEG showed continuous generalized 6-7 Hz theta slowing.Hyperventilation and photic stimulation were not performed.   ABNORMALITY -Continuous slow, generalized - Background slow IMPRESSION: This study is suggestive of mild diffuse encephalopathy, nonspecific etiology. No seizures or epileptiform discharges were seen throughout the recording. Lora Havens   DG Knee 1-2 Views Right  Result Date: 09/07/2020 CLINICAL DATA:  [place]. EXAM: RIGHT KNEE - 1-2 VIEW COMPARISON:  None. FINDINGS: Previous hardware fixation of the lateral tibial plateau. Remote fracture deformity of the proximal fibula noted. No joint effusion identified. Loose body within the suprapatellar joint space measures 8 mm. Moderate tricompartment osteoarthritis is noted with joint space narrowing and marginal spur formation. Chondrocalcinosis identified. IMPRESSION: 1. No acute findings. 2. Remote fracture deformity of the proximal fibula. 3. Moderate tricompartment osteoarthritis and chondrocalcinosis. Electronically Signed   By: Kerby Moors M.D.   On: 09/07/2020 12:08   CT CERVICAL SPINE WO CONTRAST  Result Date: 09/07/2020 CLINICAL DATA:  [place]. EXAM: CT CERVICAL SPINE WITHOUT CONTRAST TECHNIQUE: Multidetector CT imaging of  the cervical spine was performed without intravenous contrast. Multiplanar CT image reconstructions were also generated. COMPARISON:  Cervical spine MRI dated 09/07/2020 FINDINGS: Alignment: Mild anterolisthesis at the C6-7 and C7-T1 levels. This is unchanged at the C7-T1 level  since 11/19/2005. Skull base and vertebrae: No acute fracture. No primary bone lesion or focal pathologic process. Soft tissues and spinal canal: No prevertebral fluid or swelling. No visible canal hematoma. Disc levels: Multilevel degenerative changes, including facet degenerative changes throughout the cervical spine. Upper chest: Clear lung apices. Other: Right carotid artery atheromatous calcifications and minimal left carotid artery atheromatous calcifications. IMPRESSION: 1. No fracture or traumatic subluxation. 2. Multilevel degenerative changes, as described above. 3. Right carotid artery atheromatous calcifications and minimal left carotid artery atheromatous calcifications. Electronically Signed   By: Claudie Revering M.D.   On: 09/07/2020 12:19   MR BRAIN WO CONTRAST  Result Date: 09/06/2020 CLINICAL DATA:  Encephalopathy EXAM: MRI HEAD WITHOUT CONTRAST TECHNIQUE: Multiplanar, multiecho pulse sequences of the brain and surrounding structures were obtained without intravenous contrast. COMPARISON:  08/24/2020 FINDINGS: Brain: No acute infarct, mass effect or extra-axial collection. No acute or chronic hemorrhage. Hyperintense T2-weighted signal is moderately widespread throughout the white matter. Generalized volume loss without a clear lobar predilection. The midline structures are normal. Vascular: Major flow voids are preserved. Skull and upper cervical spine: Normal calvarium and skull base. Visualized upper cervical spine and soft tissues are normal. Sinuses/Orbits:No paranasal sinus fluid levels or advanced mucosal thickening. No mastoid or middle ear effusion. Normal orbits. IMPRESSION: 1. No acute intracranial abnormality.  2. Moderate chronic small vessel ischemic changes and generalized volume loss. Electronically Signed   By: Ulyses Jarred M.D.   On: 09/06/2020 20:26   MR THORACIC SPINE WO CONTRAST  Result Date: 09/08/2020 CLINICAL DATA:  Bilateral upper and lower extremity weakness and pain. EXAM: MRI THORACIC AND LUMBAR SPINE WITHOUT CONTRAST TECHNIQUE: Multiplanar and multiecho pulse sequences of the thoracic and lumbar spine were obtained without intravenous contrast. COMPARISON:  CT chest 09/06/2020.  CT abdomen and pelvis 06/21/2017. FINDINGS: MRI THORACIC SPINE FINDINGS Alignment: 0.3 cm anterolisthesis C7 on T1 and trace anterolisthesis T9 on T10. Vertebrae: No fracture, evidence of discitis, or bone lesion. Scattered small Schmorl's nodes are noted. Mild degenerative endplate signal change is seen anteriorly at T9-10 and T10-11. Cord:  Normal signal throughout. Paraspinal and other soft tissues: Left hilar mass is better demonstrated on the patient's recent chest CT. Otherwise negative. Disc levels: T1-2: There is loss of disc space height with a shallow bulge and endplate spur. The central canal is open. Moderate bilateral foraminal narrowing is noted. T2-3: Tiny central protrusion without stenosis. T3-4: Negative. T4-5: Minimal bulge without stenosis. T5-6: Negative. T6-7: Minimal bulge without stenosis. T7-8: Minimal bulge without stenosis. T8-9: Moderate facet arthropathy.  Shallow disc bulge.  No stenosis. T9-10: Moderate facet arthritis moderate to moderately severe facet degenerative change, worse on the left. The disc is uncovered with a shallow bulge and endplate spur and there is some ligamentum flavum thickening. There is mild central canal stenosis. Mild to moderate foraminal narrowing is worse on the left. T10-11: Left worse than right facet degenerative disease. Minimal disc bulge. Moderate left foraminal narrowing. The central canal and right foramen are open. T11-12: Facet degenerative change.   Otherwise negative. T12-L1: Facet degenerative disease and a minimal disc bulge. No stenosis. MRI LUMBAR SPINE FINDINGS Segmentation:  Standard. Alignment: There is convex left scoliosis with the apex at approximately L2-3 Vertebrae:  No fracture, evidence of discitis, or bone lesion. Conus medullaris and cauda equina: Conus extends to the L2 level. Conus and cauda equina appear normal. Paraspinal and other soft tissues: Negative. Disc levels: L1-2: There is loss of disc space height. Disc bulge and  endplate spur are more prominent to the right. There is also some ligamentum flavum thickening which is worse on the right. Narrowing in the right lateral recess could impact the descending right L2 root. Foramina are open. L2-3: Central protrusion and endplate spur. There is some narrowing in the right subarticular recess and moderate right foraminal narrowing. The left foramen is open. Facet degenerative change is more notable on the right. L3-4: Bilateral facet arthropathy. Broad-based disc bulge and endplate spur. Vacuum disc phenomenon. Moderate central canal stenosis and left worse than right subarticular recess narrowing. Moderately severe bilateral foraminal narrowing. L4-5: There is autologous fusion across the disc interspace and the facets are ankylosed. No bulge or protrusion. No stenosis. L5-S1: Autologous fusion across the disc interspace and ankylosis of the left facets. No stenosis. IMPRESSION: MR THORACIC SPINE IMPRESSION Known left hilar mass is better demonstrated on recent chest CT. Mild central canal stenosis and mild to moderate foraminal narrowing at T9-10. Facet degenerative change at this level is worse on the left. Moderate left foraminal narrowing T10-11. MR LUMBAR SPINE IMPRESSION Convex left scoliosis. Degenerative disc disease at L1-2 results in narrowing in the right subarticular recess which could impact the descending right L2 root. Moderate right foraminal narrowing and narrowing in the  right subarticular recess at L2-3 could impact the descending right L3 and exiting right L2 roots. Moderate central canal stenosis and left worse than right subarticular recess narrowing at L3-4. There is also moderately severe bilateral foraminal narrowing at this level. Electronically Signed   By: Inge Rise M.D.   On: 09/08/2020 11:23   MR LUMBAR SPINE WO CONTRAST  Result Date: 09/08/2020 CLINICAL DATA:  Bilateral upper and lower extremity weakness and pain. EXAM: MRI THORACIC AND LUMBAR SPINE WITHOUT CONTRAST TECHNIQUE: Multiplanar and multiecho pulse sequences of the thoracic and lumbar spine were obtained without intravenous contrast. COMPARISON:  CT chest 09/06/2020.  CT abdomen and pelvis 06/21/2017. FINDINGS: MRI THORACIC SPINE FINDINGS Alignment: 0.3 cm anterolisthesis C7 on T1 and trace anterolisthesis T9 on T10. Vertebrae: No fracture, evidence of discitis, or bone lesion. Scattered small Schmorl's nodes are noted. Mild degenerative endplate signal change is seen anteriorly at T9-10 and T10-11. Cord:  Normal signal throughout. Paraspinal and other soft tissues: Left hilar mass is better demonstrated on the patient's recent chest CT. Otherwise negative. Disc levels: T1-2: There is loss of disc space height with a shallow bulge and endplate spur. The central canal is open. Moderate bilateral foraminal narrowing is noted. T2-3: Tiny central protrusion without stenosis. T3-4: Negative. T4-5: Minimal bulge without stenosis. T5-6: Negative. T6-7: Minimal bulge without stenosis. T7-8: Minimal bulge without stenosis. T8-9: Moderate facet arthropathy.  Shallow disc bulge.  No stenosis. T9-10: Moderate facet arthritis moderate to moderately severe facet degenerative change, worse on the left. The disc is uncovered with a shallow bulge and endplate spur and there is some ligamentum flavum thickening. There is mild central canal stenosis. Mild to moderate foraminal narrowing is worse on the left. T10-11:  Left worse than right facet degenerative disease. Minimal disc bulge. Moderate left foraminal narrowing. The central canal and right foramen are open. T11-12: Facet degenerative change.  Otherwise negative. T12-L1: Facet degenerative disease and a minimal disc bulge. No stenosis. MRI LUMBAR SPINE FINDINGS Segmentation:  Standard. Alignment: There is convex left scoliosis with the apex at approximately L2-3 Vertebrae:  No fracture, evidence of discitis, or bone lesion. Conus medullaris and cauda equina: Conus extends to the L2 level. Conus and cauda equina appear normal.  Paraspinal and other soft tissues: Negative. Disc levels: L1-2: There is loss of disc space height. Disc bulge and endplate spur are more prominent to the right. There is also some ligamentum flavum thickening which is worse on the right. Narrowing in the right lateral recess could impact the descending right L2 root. Foramina are open. L2-3: Central protrusion and endplate spur. There is some narrowing in the right subarticular recess and moderate right foraminal narrowing. The left foramen is open. Facet degenerative change is more notable on the right. L3-4: Bilateral facet arthropathy. Broad-based disc bulge and endplate spur. Vacuum disc phenomenon. Moderate central canal stenosis and left worse than right subarticular recess narrowing. Moderately severe bilateral foraminal narrowing. L4-5: There is autologous fusion across the disc interspace and the facets are ankylosed. No bulge or protrusion. No stenosis. L5-S1: Autologous fusion across the disc interspace and ankylosis of the left facets. No stenosis. IMPRESSION: MR THORACIC SPINE IMPRESSION Known left hilar mass is better demonstrated on recent chest CT. Mild central canal stenosis and mild to moderate foraminal narrowing at T9-10. Facet degenerative change at this level is worse on the left. Moderate left foraminal narrowing T10-11. MR LUMBAR SPINE IMPRESSION Convex left scoliosis.  Degenerative disc disease at L1-2 results in narrowing in the right subarticular recess which could impact the descending right L2 root. Moderate right foraminal narrowing and narrowing in the right subarticular recess at L2-3 could impact the descending right L3 and exiting right L2 roots. Moderate central canal stenosis and left worse than right subarticular recess narrowing at L3-4. There is also moderately severe bilateral foraminal narrowing at this level. Electronically Signed   By: Inge Rise M.D.   On: 09/08/2020 11:23   DG HIP UNILAT WITH PELVIS 2-3 VIEWS LEFT  Result Date: 09/07/2020 CLINICAL DATA:  Bilateral hip pain after a fall.  Initial encounter. EXAM: DG HIP (WITH OR WITHOUT PELVIS) 2-3V LEFT; DG HIP (WITH OR WITHOUT PELVIS) 2-3V RIGHT COMPARISON:  Plain films of the hips 08/26/2020. FINDINGS: No acute bony or joint abnormality is seen. 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 IMPRESSION: No acute abnormality. Electronically Signed   By: Inge Rise M.D.   On: 09/07/2020 12:09   DG HIP UNILAT WITH PELVIS 2-3 VIEWS RIGHT  Result Date: 09/07/2020 CLINICAL DATA:  Bilateral hip pain after a fall.  Initial encounter. EXAM: DG HIP (WITH OR WITHOUT PELVIS) 2-3V LEFT; DG HIP (WITH OR WITHOUT PELVIS) 2-3V RIGHT COMPARISON:  Plain films of the hips 08/26/2020. FINDINGS: No acute bony or joint abnormality is seen. 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 IMPRESSION: No acute abnormality. Electronically Signed   By: Inge Rise M.D.   On: 09/07/2020 12:09    Medications:  Scheduled: . carvedilol  3.125 mg Oral BID WC  . citalopram  20 mg Oral QHS  . clonazepam  0.25 mg Oral QHS  . donepezil  10 mg Oral QHS  . insulin aspart  0-15 Units Subcutaneous TID WC  . latanoprost  1 drop Both Eyes QHS  . levothyroxine  50 mcg Oral QAC breakfast  . pantoprazole  40 mg Oral Daily  . sodium chloride flush  3 mL Intravenous Q12H  . zolpidem  5 mg Oral Daily     Assessment: 86 year old female admitted with sepsis. Initially improved then decompensated with AMS and BLE weakness 1. 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 The BLE weakness improves with repeated coaching and BUE also are  with poor effort which can be improved with coaching. There is a trend towards greater weakness of her LLE relative to the right however. Also with pain in BLE and RUE as well as neck with passive ROM assessment. Her pain significantly limits the amount of effort she is able to exert during motor examination. She has increased tone possibly due to guarding x 4. Waxy rigidity is noted with passive ROM.  2. MRI brain 12/29:No acute intracranial abnormality. Moderate chronic small vessel ischemic changes and generalized volume loss.  Images personally reviewed.  3. Vitamin B12 level is normal. AST, ALT and ammonia are normal. BUN/CR normal. Calcium unremarkable. Na slightly low but does not explain presentation. TSH normal in the setting of levothyroxine supplementation. RPR non-reactive.  4. EEG is suggestive of milddiffuse encephalopathy, nonspecific to etiology.No seizures or epileptiform discharges were seen throughout the recording. 5. MRI T-spine reveals mild central canal stenosis and mild to moderate foraminal narrowing at T9-10. Facet degenerative change at this level is worse on the left. Moderate left foraminal narrowing T10-11. . 6. MRI L-spine reveals the following: Convex left scoliosis. Degenerative disc disease at L1-2 results in narrowing in the right subarticular recess which could impact the descending right L2 root. Moderate right foraminal narrowing and narrowing in the right subarticular recess at L2-3 could impact the descending right L3 and exiting right L2 roots. Moderate central canal stenosis and left worse than right subarticular recess narrowing at L3-4. There is also moderately severe bilateral foraminal  narrowing at this level. 7. The spine imaging findings are not felt to contribute significantly to the patient's apparent BLE weakness, except for right lateral recess narrowing at 3 of the lumbar levels which most likely contributes to the asymmetry seen on motor exam of her lower extremities. Her mental status change, thought most likely to be a hypoactive catatonia, may have unmasked a previously compensated for subclinical right lower extremity motor deficit.  8. Slight improvement in her movement, speech and engagement during exam 6 hours after receiving her Ambien dose.   Recommendations: 1. Continue Celexa, clonazepam and Aricept 2. Avoid antipsychotics 3. Continue trial of Ambien qAM x 2 more days. Ambien in this case would not be used for sleep. Ambien has the paradoxical effect of reversing catatonia in some cases. IV Ativan may also have this effect.     LOS: 8 days   @Electronically  signed: Dr. Kerney Elbe 09/08/2020  8:03 PM

## 2020-09-08 NOTE — Anesthesia Preprocedure Evaluation (Addendum)
Anesthesia Evaluation  Patient identified by MRN, date of birth, ID band Patient awake    Reviewed: Allergy & Precautions, H&P , NPO status , Patient's Chart, lab work & pertinent test results, reviewed documented beta blocker date and time   Airway Mallampati: III  TM Distance: <3 FB Neck ROM: Full    Dental no notable dental hx. (+) Teeth Intact, Dental Advisory Given   Pulmonary  Lung mass   Pulmonary exam normal breath sounds clear to auscultation       Cardiovascular hypertension, Pt. on home beta blockers Normal cardiovascular exam+ dysrhythmias Atrial Fibrillation  Rhythm:Irregular Rate:Normal     Neuro/Psych PSYCHIATRIC DISORDERS Anxiety Depression TIA   GI/Hepatic negative GI ROS, Neg liver ROS,   Endo/Other  diabetes, Type 2, Oral Hypoglycemic AgentsHypothyroidism Obesity   Renal/GU negative Renal ROS  negative genitourinary   Musculoskeletal  (+) Arthritis ,   Abdominal   Peds negative pediatric ROS (+)  Hematology  (+) Blood dyscrasia, anemia ,   Anesthesia Other Findings   Reproductive/Obstetrics negative OB ROS                          Anesthesia Physical Anesthesia Plan  ASA: III  Anesthesia Plan: General   Post-op Pain Management:    Induction: Intravenous  PONV Risk Score and Plan: 3 and Ondansetron, Dexamethasone and Treatment may vary due to age or medical condition  Airway Management Planned: Oral ETT  Additional Equipment:   Intra-op Plan:   Post-operative Plan: Extubation in OR  Informed Consent: I have reviewed the patients History and Physical, chart, labs and discussed the procedure including the risks, benefits and alternatives for the proposed anesthesia with the patient or authorized representative who has indicated his/her understanding and acceptance.   Patient has DNR.  Discussed DNR with patient and Suspend DNR.   Dental advisory  given  Plan Discussed with: CRNA and Surgeon  Anesthesia Plan Comments:       Anesthesia Quick Evaluation

## 2020-09-08 NOTE — Progress Notes (Signed)
Procedure delayed until tomorrow due to anesthesia staffing shortage. Will do tomorrow 730am Updated patient and family.

## 2020-09-08 NOTE — Progress Notes (Signed)
PROGRESS NOTE    Ashlee Greer  GYI:[id number] DOB: 1933/01/03 DOA: 08/30/2020 PCP: [name], MD  Chief Complaint  Patient presents with  . Loss of Consciousness   Brief Narrative:  Ashlee Greer a 86 y.o.femalewith medical history significant ofA. fib not on anticoagulation, anxiety/depression, arthritis, diabetes, glaucoma, hypertension, hypothyroidism who presented with recurrent loss of consciousness secondary to orthostatic hypotension.  Patient was recently discharged from [facility] after treatment for TIA, UTI, pneumonia. She also had some fatigue and orthostasis while she was there. She was discharged on Bactrim for UTI and Levaquin for her pneumonia. She was recommended to go to an SNF at discharge but family is an Therapist, sports and stated they would care for patient at home.  However, her BP remained low prior to discharge which concerned family.  Due to ongoing ortho hypoTN on returning home, family brought patient to the hospital.    Workup in the ER was notable for temp 101.4, tachypnea, tachycardia. CXR showed left basilar infiltrate vs atelectasis.  Lactic was elevated and she was started on IVF and cefepime. Covid and flu swab negative.  She continued to improve with antibiotics. She was evaluated by physical therapy and recommended for SNF rehab at discharge.  This was discussed with her family who was also in agreement.  Assessment & Plan:   Active Problems:   History of TIA (transient ischemic attack)   PAF (paroxysmal atrial fibrillation) (HCC)   Anxiety and depression   Diabetes (HCC)   Hypothyroidism   Hand pain, right  Severe sepsis 2/2 Community Acquired Pneumonia (HCC)-resolved as of 09/04/2020 - now clinically improved on IV abx; okay to change to Azithro (continue flagyl) and will complete course (d/c cefepime) - follow up cultures: negative to date - finishing out 7 day course total   3.8 x 3.5 cm L hilar Mass      - she's completed  course of abx as noted above      - will consult pulmonology for further recommendations -> planning for bronch with bx 11/12/91  Acute Metabolic Encephalopathy       - she's A&Ox3 today, improved, per daughter, she was independent 2 weeks ago at home and has daily decline        - MRI at 12/16 at [facility] was negative for acute events       - head CT today without acute findings       - will get MRI brain (no acute intracranial abnormality, moderate chronic small vessel ischemic changes and generalized volume loss) and EEG (mild diffuse encephalopathy, nonspecific)       - TSH wnl, follow ammonia (wnl), B12 (wnl), RPR (nonreactive)       - appreciate neurology, 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 If related to general decline, encephalopathy, but she has marked LE weakness, unable to lift R heel off bed and just able to clear the L leg - with hx independence 2 weeks ago, will discuss with neurology regarding need for additional w/u        - appreciate neurology recs ->MRI with mild central canal stenosis and mild to oderate foraminal narrowing at T9-10, moderate L foraminal narrowing T10-11.  Convex L scoliosis.  Degenerative disc disease at L1-2 results in narrowing in R subarticular recess (could impact descending R L2 root).  Moderate R foralminal narrowing and narrowing in  R subarticular recess at L2-3 could impact descending R L3 and exiting R L2 roots.  Moderate central canal stenosis and L worse than R subarticular recess narrowing at L3-4.  Bilateral foraminal narrowing at this level.          - will follow with neurology  Orthostatic hypotension-resolved as of 09/02/2020 -Also possibly multifactorial in setting of infection as well as polypharmacy.  Her medications reviewed and she is on sedating medications including clonazepam as well as anticholinergic mechanisms with nortriptyline which will be held;  Previous MD has also counseled her bedside that her clonazepam dose needs to be reduced at discharge and ideally would benefit from weaning off of it altogether (continue to taper given her AMS) -resume celexa at lower dose, 20 mg daily on 12/25 -Continue monitoring blood pressure which is improving -Repeat orthostatic blood pressure: negative on 12/24  History of Fall       - occurred prior to [place] hospitalization       - ? If related to R wrist pain (see below).  She has neck pain, will get cervical imaging (no fracture or subluxation)      - she's complaining of pain limiting lower extremity strength today -> bilateral hip and R knee film without acute finding (remote proximal fibula fx, tricompartment OA and chondrocalcinosis)  History of TIA (transient ischemic attack) - see afib - never received records from [facility] and depression -See orthostatic hypotension -Clonazepam dose significantly reduced and holding nortriptyline -Celexa on hold but will try to resume soon  PAF (paroxysmal atrial fibrillation) ([place]) -Discussed bedside with patient; Eliquis made her confused in the past.  Denies any significant bleeding events.  She is open to alternative anticoagulation -Discussed that we would trial Xarelto (started 12/27) -Continue Coreg  Acute lower UTI-resolved as of 09/04/2020 -She does endorse some symptoms.  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  Consultants:   pulm  Procedures:  none  Antimicrobials:  Anti-infectives (From admission, onward)   Start     Dose/Rate Route Frequency Ordered Stop   09/02/20 1215  azithromycin (ZITHROMAX) tablet 500 mg        500 mg Oral Daily 09/02/20 1122 09/07/20 1055   08/31/20 1615  metroNIDAZOLE (FLAGYL) tablet 500 mg  Status:  Discontinued        500 mg Oral Every 8 hours 08/31/20 1608 09/06/20 1654   08/30/20 2100  ceFEPIme (MAXIPIME) 2 g in sodium chloride 0.9 % 100 mL IVPB  Status:  Discontinued        2 g 200 mL/hr over 30 Minutes Intravenous Every 12 hours 08/30/20 2011 09/02/20 1122     Subjective: A&Ox3 Inconsistent pain all over reported (not consistent on exam)  Objective: Vitals:   09/07/20 1529 09/07/20 2042 09/08/20 0544 09/08/20 1310  BP: 127/84 130/82 126/88 128/80  Pulse: (!) 104 100 90 98  Resp: 18 17 16 17   Temp: 99 F (37.2 C) 98.7 F (37.1 C) 98.2 F (36.8 C) 97.8 F (36.6 C)  TempSrc: Axillary Axillary Axillary Oral  SpO2: 93% 94% 96% 93%  Weight:      Height:        Intake/Output Summary (Last 24 hours) at 09/08/2020 1844 Last data filed at 09/08/2020 1836 Gross per 24 hour  Intake 480 ml  Output 1350 ml  Net -870 ml   Filed Weights   08/30/20 1748  Weight: 88.5 kg    Examination:  General: No acute distress. Cardiovascular: RRR Lungs: unlabored Abdomen: Soft, nontender, nondistended Neurological: Alert and oriented 3, slowed in responses.  Moves arms slowly, able to lift both arms off bed.  Antigravity to LLE (heel barely clears bed), 2/5 to RLE.  Skin: Warm and dry. No rashes or lesions. Extremities:  No clubbing or cyanosis. No edema.    Data Reviewed: I have personally reviewed following labs and imaging studies  CBC: Recent Labs  Lab 09/04/20 0609 09/05/20 0056 09/06/20 0834 09/07/20 0046 09/08/20 1437  WBC 10.7* 10.3 10.9* 11.6* 13.8*  NEUTROABS 7.9* 7.5 8.7* 8.9* 12.0*  HGB 11.0* 11.4* 12.1 11.6* 11.5*  HCT 32.5* 32.6* 36.4 35.4* 35.8*  MCV 91.0 90.3 90.1 91.2 93.0  PLT 269 334 350 326 093    Basic Metabolic Panel: Recent Labs  Lab 09/03/20 0436 09/04/20 0609 09/05/20 0231 09/06/20 0834 09/07/20 0046 09/08/20 0100 09/08/20 0853  NA 136 134* 131* 130* 131*  --  133*  K 4.5 4.8 4.4 4.3 4.3  --  4.6  CL 103 98 95* 95* 99  --  98  CO2 25 24 25 23 22   --  23  GLUCOSE 143* 132* 136* 169* 144*  --  151*  BUN 15 12 12 11 12   --  18  CREATININE 0.77 0.71 0.71 0.77 0.70  --  0.75  CALCIUM 8.4* 8.5* 8.3* 8.4* 8.4*  --  8.0*  MG 2.0 1.9 2.0  --  1.9 1.9  --   PHOS  --   --   --   --  2.8  --   --     GFR: Estimated Creatinine Clearance: 52 mL/min (by C-G formula based on SCr of 0.75 mg/dL).  Liver Function Tests: Recent Labs  Lab 09/06/20 0834 09/07/20 0046 09/08/20 0853  AST 21 22 27   ALT 22 17 19   ALKPHOS 71 62 65  BILITOT 0.7 0.5 0.4  PROT 6.5 6.2* 6.1*  ALBUMIN 2.0* 1.8* 1.8*    CBG: Recent Labs  Lab 09/07/20 1644 09/07/20 2132 09/08/20 0737 09/08/20 1200 09/08/20 1631  GLUCAP 251* 110* 150* 167* 211*     Recent Results (from the past 240 hour(s))  Blood culture (routine single)     Status: None   Collection Time: 08/30/20  6:33 PM   Specimen: BLOOD  Result Value Ref Range Status   Specimen Description BLOOD BLOOD RIGHT FOREARM  Final   Special Requests   Final    BOTTLES DRAWN AEROBIC ONLY Blood Culture results may not be optimal due  to an inadequate volume of blood received in culture bottles   Culture   Final    NO GROWTH 5 DAYS Performed at [facility], [place] [address], [place], [place] [address]    Report Status 09/04/2020  FINAL  Final  Resp Panel by RT-PCR (Flu A&B, Covid) Nasopharyngeal Swab     Status: None   Collection Time: 08/30/20  6:33 PM   Specimen: Nasopharyngeal Swab; Nasopharyngeal(NP) swabs in vial transport medium  Result Value Ref Range Status   SARS Coronavirus 2 by RT PCR NEGATIVE NEGATIVE Final    Comment: (NOTE) SARS-CoV-2 target nucleic acids are NOT DETECTED.  The SARS-CoV-2 RNA is generally detectable in upper respiratory specimens during the acute phase of infection. The lowest concentration of SARS-CoV-2 viral copies this assay can detect is 138 copies/mL. A negative result does not preclude SARS-Cov-2 infection and should not be used as the sole basis for treatment or other patient management decisions. A negative result may occur with  improper specimen collection/handling, submission of specimen other than nasopharyngeal swab, presence of viral mutation(s) within the areas targeted by this assay, and inadequate number of viral copies(<138 copies/mL). A negative result must be combined with clinical observations, patient history, and epidemiological information. The expected result is Negative.  Fact Sheet for Patients:  EntrepreneurPulse.com.au  Fact Sheet for Healthcare Providers:  IncredibleEmployment.be  This test is no t yet approved or cleared by the [place] FDA and  has been authorized for detection and/or diagnosis of SARS-CoV-2 by FDA under an Emergency Use Authorization (EUA). This EUA will remain  in effect (meaning this test can be used) for the duration of the COVID-19 declaration under Section 564(b)(1) of the Act, 21 U.S.C.section 360bbb-3(b)(1), unless the authorization is terminated  or revoked sooner.       Influenza A by PCR NEGATIVE NEGATIVE Final   Influenza B by PCR NEGATIVE NEGATIVE Final    Comment: (NOTE) The Xpert Xpress SARS-CoV-2/FLU/RSV plus assay is intended as an aid in the diagnosis of influenza  from Nasopharyngeal swab specimens and should not be used as a sole basis for treatment. Nasal washings and aspirates are unacceptable for Xpert Xpress SARS-CoV-2/FLU/RSV testing.  Fact Sheet for Patients: EntrepreneurPulse.com.au  Fact Sheet for Healthcare Providers: IncredibleEmployment.be  This test is not yet approved or cleared by the [place] FDA and has been authorized for detection and/or diagnosis of SARS-CoV-2 by FDA under an Emergency Use Authorization (EUA). This EUA will remain in effect (meaning this test can be used) for the duration of the COVID-19 declaration under Section 564(b)(1) of the Act, 21 U.S.C. section 360bbb-3(b)(1), unless the authorization is terminated or revoked.  Performed at [facility], [place] [address], [place], [place] [address]   Culture, blood (single)     Status: None   Collection Time: 08/30/20  8:25 PM   Specimen: BLOOD RIGHT HAND  Result Value Ref Range Status   Specimen Description BLOOD RIGHT HAND  Final   Special Requests   Final    BOTTLES DRAWN AEROBIC AND ANAEROBIC Blood Culture results may not be optimal due to an inadequate volume of blood received in culture bottles   Culture   Final    NO GROWTH 5 DAYS Performed at [facility], [place] [address], [place], [place] [address]    Report Status 09/04/2020 FINAL  Final  MRSA PCR Screening     Status: None   Collection Time: 08/31/20  1:07 AM   Specimen:  Nasal Mucosa; Nasopharyngeal  Result Value Ref Range Status   MRSA by PCR NEGATIVE NEGATIVE Final    Comment:        The GeneXpert MRSA Assay (FDA approved for NASAL specimens only), is one component of a comprehensive MRSA colonization surveillance program. It is not intended to diagnose MRSA infection nor to guide or monitor treatment for MRSA infections. Performed at [facility], [place] [address] [place], [place] [address]   Respiratory Panel by PCR     Status:  None   Collection Time: 09/01/20  1:48 PM   Specimen: Nasopharyngeal Swab; Respiratory  Result Value Ref Range Status   Adenovirus NOT DETECTED NOT DETECTED Final   Coronavirus 229E NOT DETECTED NOT DETECTED Final    Comment: (NOTE) The Coronavirus on the Respiratory Panel, DOES NOT test for the novel  Coronavirus (2019 nCoV)    Coronavirus HKU1 NOT DETECTED NOT DETECTED Final   Coronavirus NL63 NOT DETECTED NOT DETECTED Final   Coronavirus OC43 NOT DETECTED NOT DETECTED Final   Metapneumovirus NOT DETECTED NOT DETECTED Final   Rhinovirus / Enterovirus NOT DETECTED NOT DETECTED Final   Influenza A NOT DETECTED NOT DETECTED Final   Influenza B NOT DETECTED NOT DETECTED Final   Parainfluenza Virus 1 NOT DETECTED NOT DETECTED Final   Parainfluenza Virus 2 NOT DETECTED NOT DETECTED Final   Parainfluenza Virus 3 NOT DETECTED NOT DETECTED Final   Parainfluenza Virus 4 NOT DETECTED NOT DETECTED Final   Respiratory Syncytial Virus NOT DETECTED NOT DETECTED Final   Bordetella pertussis NOT DETECTED NOT DETECTED Final   Bordetella Parapertussis NOT DETECTED NOT DETECTED Final   Chlamydophila pneumoniae NOT DETECTED NOT DETECTED Final   Mycoplasma pneumoniae NOT DETECTED NOT DETECTED Final    Comment: Performed at [facility], [place]. [address], [place],  [address]  Resp Panel by RT-PCR (Flu A&B, Covid) Nasopharyngeal Swab     Status: None   Collection Time: 09/05/20  4:47 PM   Specimen: Nasopharyngeal Swab; Nasopharyngeal(NP) swabs in vial transport medium  Result Value Ref Range Status   SARS Coronavirus 2 by RT PCR NEGATIVE NEGATIVE Final    Comment: (NOTE) SARS-CoV-2 target nucleic acids are NOT DETECTED.  The SARS-CoV-2 RNA is generally detectable in upper respiratory specimens during the acute phase of infection. The lowest concentration of SARS-CoV-2 viral copies this assay can detect is 138 copies/mL. A negative result does not preclude SARS-Cov-2 infection and should  not be used as the sole basis for treatment or other patient management decisions. A negative result may occur with  improper specimen collection/handling, submission of specimen other than nasopharyngeal swab, presence of viral mutation(s) within the areas targeted by this assay, and inadequate number of viral copies(<138 copies/mL). A negative result must be combined with clinical observations, patient history, and epidemiological information. The expected result is Negative.  Fact Sheet for Patients:  EntrepreneurPulse.com.au  Fact Sheet for Healthcare Providers:  IncredibleEmployment.be  This test is no t yet approved or cleared by the [place] FDA and  has been authorized for detection and/or diagnosis of SARS-CoV-2 by FDA under an Emergency Use Authorization (EUA). This EUA will remain  in effect (meaning this test can be used) for the duration of the COVID-19 declaration under Section 564(b)(1) of the Act, 21 U.S.C.section 360bbb-3(b)(1), unless the authorization is terminated  or revoked sooner.       Influenza A by PCR NEGATIVE NEGATIVE Final   Influenza B by PCR NEGATIVE NEGATIVE Final  Comment: (NOTE) The Xpert Xpress SARS-CoV-2/FLU/RSV plus assay is intended as an aid in the diagnosis of influenza from Nasopharyngeal swab specimens and should not be used as a sole basis for treatment. Nasal washings and aspirates are unacceptable for Xpert Xpress SARS-CoV-2/FLU/RSV testing.  Fact Sheet for Patients: EntrepreneurPulse.com.au  Fact Sheet for Healthcare Providers: IncredibleEmployment.be  This test is not yet approved or cleared by the [place] FDA and has been authorized for detection and/or diagnosis of SARS-CoV-2 by FDA under an Emergency Use Authorization (EUA). This EUA will remain in effect (meaning this test can be used) for the duration of the COVID-19 declaration under  Section 564(b)(1) of the Act, 21 U.S.C. section 360bbb-3(b)(1), unless the authorization is terminated or revoked.  Performed at [facility], [place] [address] [place], [place] [address]          Radiology Studies: EEG  Result Date: 09/07/2020 Lora Havens, MD     09/07/2020  7:42 AM Patient Name: ZELLA DEWAN MRN: [id number] Epilepsy Attending: Lora Havens Referring Physician/Provider: Dr Fayrene Helper Date: 09/06/2020 Duration: 24.03 mins Patient history: 86yo F with ams. EEG to evaluate for seizure Level of alertness: Awake AEDs during EEG study: Clonazapem Technical aspects: This EEG study was done with scalp electrodes positioned according to the 10-20 International system of electrode placement. Electrical activity was acquired at a sampling rate of 500Hz  and reviewed with a high frequency filter of 70Hz  and a low frequency filter of 1Hz . EEG data were recorded continuously and digitally stored. Description: The posterior dominant rhythm consists of 7 Hz activity of moderate voltage (25-35 uV) seen predominantly in posterior head regions, symmetric and reactive to eye opening and eye closing. EEG showed continuous generalized 6-7 Hz theta slowing.Hyperventilation and photic stimulation were not performed.   ABNORMALITY -Continuous slow, generalized - Background slow IMPRESSION: This study is suggestive of mild diffuse encephalopathy, nonspecific etiology. No seizures or epileptiform discharges were seen throughout the recording. Lora Havens   DG Knee 1-2 Views Right  Result Date: 09/07/2020 CLINICAL DATA:  Fall. EXAM: RIGHT KNEE - 1-2 VIEW COMPARISON:  None. FINDINGS: Previous hardware fixation of the lateral tibial plateau. Remote fracture deformity of the proximal fibula noted. No joint effusion identified. Loose body within the suprapatellar joint space measures 8 mm. Moderate tricompartment osteoarthritis is noted with joint space narrowing and marginal spur  formation. Chondrocalcinosis identified. IMPRESSION: 1. No acute findings. 2. Remote fracture deformity of the proximal fibula. 3. Moderate tricompartment osteoarthritis and chondrocalcinosis. Electronically Signed   By: Kerby Moors M.D.   On: 09/07/2020 12:08   CT CERVICAL SPINE WO CONTRAST  Result Date: 09/07/2020 CLINICAL DATA:  [place]. EXAM: CT CERVICAL SPINE WITHOUT CONTRAST TECHNIQUE: Multidetector CT imaging of the cervical spine was performed without intravenous contrast. Multiplanar CT image reconstructions were also generated. COMPARISON:  Cervical spine MRI dated 09/07/2020 FINDINGS: Alignment: Mild anterolisthesis at the C6-7 and C7-T1 levels. This is unchanged at the C7-T1 level since 11/19/2005. Skull base and vertebrae: No acute fracture. No primary bone lesion or focal pathologic process. Soft tissues and spinal canal: No prevertebral fluid or swelling. No visible canal hematoma. Disc levels: Multilevel degenerative changes, including facet degenerative changes throughout the cervical spine. Upper chest: Clear lung apices. Other: Right carotid artery atheromatous calcifications and minimal left carotid artery atheromatous calcifications. IMPRESSION: 1. No fracture or traumatic subluxation. 2. Multilevel degenerative changes, as described above. 3. Right carotid artery atheromatous calcifications and minimal left carotid artery atheromatous calcifications. Electronically  Signed   By: Claudie Revering M.D.   On: 09/07/2020 12:19   MR BRAIN WO CONTRAST  Result Date: 09/06/2020 CLINICAL DATA:  Encephalopathy EXAM: MRI HEAD WITHOUT CONTRAST TECHNIQUE: Multiplanar, multiecho pulse sequences of the brain and surrounding structures were obtained without intravenous contrast. COMPARISON:  08/24/2020 FINDINGS: Brain: No acute infarct, mass effect or extra-axial collection. No acute or chronic hemorrhage. Hyperintense T2-weighted signal is moderately widespread throughout the white matter. Generalized  volume loss without a clear lobar predilection. The midline structures are normal. Vascular: Major flow voids are preserved. Skull and upper cervical spine: Normal calvarium and skull base. Visualized upper cervical spine and soft tissues are normal. Sinuses/Orbits:No paranasal sinus fluid levels or advanced mucosal thickening. No mastoid or middle ear effusion. Normal orbits. IMPRESSION: 1. No acute intracranial abnormality. 2. Moderate chronic small vessel ischemic changes and generalized volume loss. Electronically Signed   By: Ulyses Jarred M.D.   On: 09/06/2020 20:26   MR THORACIC SPINE WO CONTRAST  Result Date: 09/08/2020 CLINICAL DATA:  Bilateral upper and lower extremity weakness and pain. EXAM: MRI THORACIC AND LUMBAR SPINE WITHOUT CONTRAST TECHNIQUE: Multiplanar and multiecho pulse sequences of the thoracic and lumbar spine were obtained without intravenous contrast. COMPARISON:  CT chest 09/06/2020.  CT abdomen and pelvis 06/21/2017. FINDINGS: MRI THORACIC SPINE FINDINGS Alignment: 0.3 cm anterolisthesis C7 on T1 and trace anterolisthesis T9 on T10. Vertebrae: No fracture, evidence of discitis, or bone lesion. Scattered small Schmorl's nodes are noted. Mild degenerative endplate signal change is seen anteriorly at T9-10 and T10-11. Cord:  Normal signal throughout. Paraspinal and other soft tissues: Left hilar mass is better demonstrated on the patient's recent chest CT. Otherwise negative. Disc levels: T1-2: There is loss of disc space height with a shallow bulge and endplate spur. The central canal is open. Moderate bilateral foraminal narrowing is noted. T2-3: Tiny central protrusion without stenosis. T3-4: Negative. T4-5: Minimal bulge without stenosis. T5-6: Negative. T6-7: Minimal bulge without stenosis. T7-8: Minimal bulge without stenosis. T8-9: Moderate facet arthropathy.  Shallow disc bulge.  No stenosis. T9-10: Moderate facet arthritis moderate to moderately severe facet degenerative  change, worse on the left. The disc is uncovered with a shallow bulge and endplate spur and there is some ligamentum flavum thickening. There is mild central canal stenosis. Mild to moderate foraminal narrowing is worse on the left. T10-11: Left worse than right facet degenerative disease. Minimal disc bulge. Moderate left foraminal narrowing. The central canal and right foramen are open. T11-12: Facet degenerative change.  Otherwise negative. T12-L1: Facet degenerative disease and a minimal disc bulge. No stenosis. MRI LUMBAR SPINE FINDINGS Segmentation:  Standard. Alignment: There is convex left scoliosis with the apex at approximately L2-3 Vertebrae:  No fracture, evidence of discitis, or bone lesion. Conus medullaris and cauda equina: Conus extends to the L2 level. Conus and cauda equina appear normal. Paraspinal and other soft tissues: Negative. Disc levels: L1-2: There is loss of disc space height. Disc bulge and endplate spur are more prominent to the right. There is also some ligamentum flavum thickening which is worse on the right. Narrowing in the right lateral recess could impact the descending right L2 root. Foramina are open. L2-3: Central protrusion and endplate spur. There is some narrowing in the right subarticular recess and moderate right foraminal narrowing. The left foramen is open. Facet degenerative change is more notable on the right. L3-4: Bilateral facet arthropathy. Broad-based disc bulge and endplate spur. Vacuum disc phenomenon. Moderate central canal stenosis and left  worse than right subarticular recess narrowing. Moderately severe bilateral foraminal narrowing. L4-5: There is autologous fusion across the disc interspace and the facets are ankylosed. No bulge or protrusion. No stenosis. L5-S1: Autologous fusion across the disc interspace and ankylosis of the left facets. No stenosis. IMPRESSION: MR THORACIC SPINE IMPRESSION Known left hilar mass is better demonstrated on recent chest  CT. Mild central canal stenosis and mild to moderate foraminal narrowing at T9-10. Facet degenerative change at this level is worse on the left. Moderate left foraminal narrowing T10-11. MR LUMBAR SPINE IMPRESSION Convex left scoliosis. Degenerative disc disease at L1-2 results in narrowing in the right subarticular recess which could impact the descending right L2 root. Moderate right foraminal narrowing and narrowing in the right subarticular recess at L2-3 could impact the descending right L3 and exiting right L2 roots. Moderate central canal stenosis and left worse than right subarticular recess narrowing at L3-4. There is also moderately severe bilateral foraminal narrowing at this level. Electronically Signed   By: Inge Rise M.D.   On: 09/08/2020 11:23   MR LUMBAR SPINE WO CONTRAST  Result Date: 09/08/2020 CLINICAL DATA:  Bilateral upper and lower extremity weakness and pain. EXAM: MRI THORACIC AND LUMBAR SPINE WITHOUT CONTRAST TECHNIQUE: Multiplanar and multiecho pulse sequences of the thoracic and lumbar spine were obtained without intravenous contrast. COMPARISON:  CT chest 09/06/2020.  CT abdomen and pelvis 06/21/2017. FINDINGS: MRI THORACIC SPINE FINDINGS Alignment: 0.3 cm anterolisthesis C7 on T1 and trace anterolisthesis T9 on T10. Vertebrae: No fracture, evidence of discitis, or bone lesion. Scattered small Schmorl's nodes are noted. Mild degenerative endplate signal change is seen anteriorly at T9-10 and T10-11. Cord:  Normal signal throughout. Paraspinal and other soft tissues: Left hilar mass is better demonstrated on the patient's recent chest CT. Otherwise negative. Disc levels: T1-2: There is loss of disc space height with a shallow bulge and endplate spur. The central canal is open. Moderate bilateral foraminal narrowing is noted. T2-3: Tiny central protrusion without stenosis. T3-4: Negative. T4-5: Minimal bulge without stenosis. T5-6: Negative. T6-7: Minimal bulge without stenosis.  T7-8: Minimal bulge without stenosis. T8-9: Moderate facet arthropathy.  Shallow disc bulge.  No stenosis. T9-10: Moderate facet arthritis moderate to moderately severe facet degenerative change, worse on the left. The disc is uncovered with a shallow bulge and endplate spur and there is some ligamentum flavum thickening. There is mild central canal stenosis. Mild to moderate foraminal narrowing is worse on the left. T10-11: Left worse than right facet degenerative disease. Minimal disc bulge. Moderate left foraminal narrowing. The central canal and right foramen are open. T11-12: Facet degenerative change.  Otherwise negative. T12-L1: Facet degenerative disease and a minimal disc bulge. No stenosis. MRI LUMBAR SPINE FINDINGS Segmentation:  Standard. Alignment: There is convex left scoliosis with the apex at approximately L2-3 Vertebrae:  No fracture, evidence of discitis, or bone lesion. Conus medullaris and cauda equina: Conus extends to the L2 level. Conus and cauda equina appear normal. Paraspinal and other soft tissues: Negative. Disc levels: L1-2: There is loss of disc space height. Disc bulge and endplate spur are more prominent to the right. There is also some ligamentum flavum thickening which is worse on the right. Narrowing in the right lateral recess could impact the descending right L2 root. Foramina are open. L2-3: Central protrusion and endplate spur. There is some narrowing in the right subarticular recess and moderate right foraminal narrowing. The left foramen is open. Facet degenerative change is more notable on the right.  L3-4: Bilateral facet arthropathy. Broad-based disc bulge and endplate spur. Vacuum disc phenomenon. Moderate central canal stenosis and left worse than right subarticular recess narrowing. Moderately severe bilateral foraminal narrowing. L4-5: There is autologous fusion across the disc interspace and the facets are ankylosed. No bulge or protrusion. No stenosis. L5-S1:  Autologous fusion across the disc interspace and ankylosis of the left facets. No stenosis. IMPRESSION: MR THORACIC SPINE IMPRESSION Known left hilar mass is better demonstrated on recent chest CT. Mild central canal stenosis and mild to moderate foraminal narrowing at T9-10. Facet degenerative change at this level is worse on the left. Moderate left foraminal narrowing T10-11. MR LUMBAR SPINE IMPRESSION Convex left scoliosis. Degenerative disc disease at L1-2 results in narrowing in the right subarticular recess which could impact the descending right L2 root. Moderate right foraminal narrowing and narrowing in the right subarticular recess at L2-3 could impact the descending right L3 and exiting right L2 roots. Moderate central canal stenosis and left worse than right subarticular recess narrowing at L3-4. There is also moderately severe bilateral foraminal narrowing at this level. Electronically Signed   By: Inge Rise M.D.   On: 09/08/2020 11:23   DG HIP UNILAT WITH PELVIS 2-3 VIEWS LEFT  Result Date: 09/07/2020 CLINICAL DATA:  Bilateral hip pain after a fall.  Initial encounter. EXAM: DG HIP (WITH OR WITHOUT PELVIS) 2-3V LEFT; DG HIP (WITH OR WITHOUT PELVIS) 2-3V RIGHT COMPARISON:  Plain films of the hips 08/26/2020. FINDINGS: No acute bony or joint abnormality is seen. 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 IMPRESSION: No acute abnormality. Electronically Signed   By: Inge Rise M.D.   On: 09/07/2020 12:09   DG HIP UNILAT WITH PELVIS 2-3 VIEWS RIGHT  Result Date: 09/07/2020 CLINICAL DATA:  Bilateral hip pain after a fall.  Initial encounter. EXAM: DG HIP (WITH OR WITHOUT PELVIS) 2-3V LEFT; DG HIP (WITH OR WITHOUT PELVIS) 2-3V RIGHT COMPARISON:  Plain films of the hips 08/26/2020. FINDINGS: No acute bony or joint abnormality is seen. 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 IMPRESSION: No acute abnormality. Electronically Signed   By: Inge Rise M.D.   On:  09/07/2020 12:09        Scheduled Meds: . carvedilol  3.125 mg Oral BID WC  . citalopram  20 mg Oral QHS  . clonazepam  0.25 mg Oral QHS  . donepezil  10 mg Oral QHS  . insulin aspart  0-15 Units Subcutaneous TID WC  . latanoprost  1 drop Both Eyes QHS  . levothyroxine  50 mcg Oral QAC breakfast  . pantoprazole  40 mg Oral Daily  . sodium chloride flush  3 mL Intravenous Q12H  . zolpidem  5 mg Oral Daily   Continuous Infusions:   LOS: 8 days    Time spent: over 30 min    Fayrene Helper, MD [facility]   To contact the attending provider between 7A-7P or the covering provider during after hours 7P-7A, please log into the web site [email or web address] and access using universal [place] password for that web site. If you do not have the password, please call the hospital operator.  09/08/2020, 6:44 PM

## 2020-09-09 ENCOUNTER — Encounter (HOSPITAL_COMMUNITY)
Admission: EM | Disposition: A | Discharge status: Skilled Nursing Facility | Payer: Self-pay | Source: Home / Self Care | Attending: Family Medicine

## 2020-09-09 ENCOUNTER — Inpatient Hospital Stay (HOSPITAL_COMMUNITY): Payer: Medicare Other | Admitting: Certified Registered Nurse Anesthetist

## 2020-09-09 ENCOUNTER — Encounter (HOSPITAL_COMMUNITY): Payer: Self-pay | Admitting: Internal Medicine

## 2020-09-09 DIAGNOSIS — R6520 Severe sepsis without septic shock: Secondary | ICD-10-CM

## 2020-09-09 DIAGNOSIS — F061 Catatonic disorder due to known physiological condition: Secondary | ICD-10-CM

## 2020-09-09 DIAGNOSIS — A419 Sepsis, unspecified organism: Secondary | ICD-10-CM

## 2020-09-09 DIAGNOSIS — R531 Weakness: Secondary | ICD-10-CM

## 2020-09-09 HISTORY — PX: VIDEO BRONCHOSCOPY WITH ENDOBRONCHIAL ULTRASOUND: SHX6177

## 2020-09-09 HISTORY — PX: FINE NEEDLE ASPIRATION: SHX5430

## 2020-09-09 LAB — COMPREHENSIVE METABOLIC PANEL
ALT: 37 U/L (ref 0–44)
AST: 68 U/L — ABNORMAL HIGH (ref 15–41)
Albumin: 1.8 g/dL — ABNORMAL LOW (ref 3.5–5.0)
Alkaline Phosphatase: 81 U/L (ref 38–126)
Anion gap: 10 (ref 5–15)
BUN: 19 mg/dL (ref 8–23)
CO2: 24 mmol/L (ref 22–32)
Calcium: 8.5 mg/dL — ABNORMAL LOW (ref 8.9–10.3)
Chloride: 100 mmol/L (ref 98–111)
Creatinine, Ser: 0.71 mg/dL (ref 0.44–1.00)
GFR, Estimated: >60 mL/min (ref >60)
Glucose, Bld: 157 mg/dL — ABNORMAL HIGH (ref 70–99)
Potassium: 4.5 mmol/L (ref 3.5–5.1)
Sodium: 134 mmol/L — ABNORMAL LOW (ref 135–145)
Total Bilirubin: 0.6 mg/dL (ref 0.3–1.2)
Total Protein: 6.4 g/dL — ABNORMAL LOW (ref 6.5–8.1)

## 2020-09-09 LAB — CBC WITH DIFFERENTIAL/PLATELET
Abs Immature Granulocytes: 0.09 10*3/uL — ABNORMAL HIGH (ref 0.00–0.07)
Basophils Absolute: 0 10*3/uL (ref 0.0–0.1)
Basophils Relative: 0 %
Eosinophils Absolute: 0 10*3/uL (ref 0.0–0.5)
Eosinophils Relative: 0 %
HCT: 35.4 % — ABNORMAL LOW (ref 36.0–46.0)
Hemoglobin: 11.3 g/dL — ABNORMAL LOW (ref 12.0–15.0)
Immature Granulocytes: 1 %
Lymphocytes Relative: 13 %
Lymphs Abs: 1.6 10*3/uL (ref 0.7–4.0)
MCH: 29.6 pg (ref 26.0–34.0)
MCHC: 31.9 g/dL (ref 30.0–36.0)
MCV: 92.7 fL (ref 80.0–100.0)
Monocytes Absolute: 0.7 10*3/uL (ref 0.1–1.0)
Monocytes Relative: 5 %
Neutro Abs: 10.6 10*3/uL — ABNORMAL HIGH (ref 1.7–7.7)
Neutrophils Relative %: 81 %
Platelets: 340 10*3/uL (ref 150–400)
RBC: 3.82 MIL/uL — ABNORMAL LOW (ref 3.87–5.11)
RDW: 15.4 % (ref 11.5–15.5)
WBC: 13.1 10*3/uL — ABNORMAL HIGH (ref 4.0–10.5)
nRBC: 0 % (ref 0.0–0.2)

## 2020-09-09 LAB — PHOSPHORUS: Phosphorus: 3.2 mg/dL (ref 2.5–4.6)

## 2020-09-09 LAB — GLUCOSE, CAPILLARY
Glucose-Capillary: 161 mg/dL — ABNORMAL HIGH (ref 70–99)
Glucose-Capillary: 185 mg/dL — ABNORMAL HIGH (ref 70–99)
Glucose-Capillary: 198 mg/dL — ABNORMAL HIGH (ref 70–99)
Glucose-Capillary: 207 mg/dL — ABNORMAL HIGH (ref 70–99)
Glucose-Capillary: 157 mg/dL — ABNORMAL HIGH (ref 70–99)

## 2020-09-09 LAB — MAGNESIUM: Magnesium: 2.1 mg/dL (ref 1.7–2.4)

## 2020-09-09 SURGERY — BRONCHOSCOPY, WITH EBUS
Anesthesia: General | Laterality: Left

## 2020-09-09 MED ORDER — LIDOCAINE 2% (20 MG/ML) 5 ML SYRINGE
INTRAMUSCULAR | Status: DC | PRN
  Administered 2020-09-09: 80 mg via INTRAVENOUS

## 2020-09-09 MED ORDER — ONDANSETRON HCL 4 MG/2ML IJ SOLN
INTRAMUSCULAR | Status: DC | PRN
  Administered 2020-09-09: 4 mg via INTRAVENOUS

## 2020-09-09 MED ORDER — EPHEDRINE SULFATE-NACL 50-0.9 MG/10ML-% IV SOSY
PREFILLED_SYRINGE | INTRAVENOUS | Status: DC | PRN
  Administered 2020-09-09: 10 mg via INTRAVENOUS

## 2020-09-09 MED ORDER — SUGAMMADEX SODIUM 200 MG/2ML IV SOLN
INTRAVENOUS | Status: DC | PRN
  Administered 2020-09-09: 160 mg via INTRAVENOUS

## 2020-09-09 MED ORDER — ROCURONIUM BROMIDE 10 MG/ML (PF) SYRINGE
PREFILLED_SYRINGE | INTRAVENOUS | Status: DC | PRN
  Administered 2020-09-09: 50 mg via INTRAVENOUS

## 2020-09-09 MED ORDER — PROPOFOL 10 MG/ML IV BOLUS
INTRAVENOUS | Status: DC | PRN
  Administered 2020-09-09: 70 mg via INTRAVENOUS

## 2020-09-09 MED ORDER — DEXAMETHASONE SODIUM PHOSPHATE 10 MG/ML IJ SOLN
INTRAMUSCULAR | Status: DC | PRN
  Administered 2020-09-09: 10 mg via INTRAVENOUS

## 2020-09-09 MED ORDER — LACTATED RINGERS IV SOLN: INTRAVENOUS | Status: DC | PRN

## 2020-09-09 MED ORDER — FENTANYL CITRATE (PF) 250 MCG/5ML IJ SOLN
INTRAMUSCULAR | Status: DC | PRN
  Administered 2020-09-09: 25 ug via INTRAVENOUS

## 2020-09-09 MED ORDER — PHENYLEPHRINE 40 MCG/ML (10ML) SYRINGE FOR IV PUSH (FOR BLOOD PRESSURE SUPPORT)
PREFILLED_SYRINGE | INTRAVENOUS | Status: DC | PRN
  Administered 2020-09-09: 160 ug via INTRAVENOUS
  Administered 2020-09-09: 120 ug via INTRAVENOUS

## 2020-09-09 MED ORDER — PHENYLEPHRINE HCL-NACL 10-0.9 MG/250ML-% IV SOLN
INTRAVENOUS | Status: DC | PRN
  Administered 2020-09-09: 40 ug/min via INTRAVENOUS

## 2020-09-09 SURGICAL SUPPLY — 34 items
ADAPTER VALVE BIOPSY EBUS (MISCELLANEOUS) IMPLANT
ADPTR VALVE BIOPSY EBUS (MISCELLANEOUS)
BRUSH CYTOL CELLEBRITY 1.5X140 (MISCELLANEOUS) IMPLANT
CANISTER SUCT 3000ML PPV (MISCELLANEOUS) ×4 IMPLANT
CONT SPEC 4OZ CLIKSEAL STRL BL (MISCELLANEOUS) ×4 IMPLANT
COVER BACK TABLE 60X90IN (DRAPES) ×4 IMPLANT
FORCEPS BIOP RJ4 1.8 (CUTTING FORCEPS) IMPLANT
GAUZE SPONGE 4X4 12PLY STRL (GAUZE/BANDAGES/DRESSINGS) ×4 IMPLANT
GLOVE BIO SURGEON STRL SZ7.5 (GLOVE) ×4 IMPLANT
GOWN STRL REUS W/ TWL LRG LVL3 (GOWN DISPOSABLE) ×2 IMPLANT
GOWN STRL REUS W/ TWL XL LVL3 (GOWN DISPOSABLE) ×2 IMPLANT
GOWN STRL REUS W/TWL LRG LVL3 (GOWN DISPOSABLE) ×4
GOWN STRL REUS W/TWL XL LVL3 (GOWN DISPOSABLE) ×4
KIT CLEAN ENDO COMPLIANCE (KITS) ×8 IMPLANT
KIT TURNOVER KIT B (KITS) ×4 IMPLANT
MARKER SKIN DUAL TIP RULER LAB (MISCELLANEOUS) ×4 IMPLANT
NEEDLE ASPIRATION VIZISHOT 19G (NEEDLE) IMPLANT
NEEDLE ASPIRATION VIZISHOT 21G (NEEDLE) IMPLANT
NS IRRIG 1000ML POUR BTL (IV SOLUTION) ×4 IMPLANT
OIL SILICONE PENTAX (PARTS (SERVICE/REPAIRS)) ×4 IMPLANT
PAD ARMBOARD 7.5X6 YLW CONV (MISCELLANEOUS) ×8 IMPLANT
SYR 20ML ECCENTRIC (SYRINGE) ×8 IMPLANT
SYR 20ML LL LF (SYRINGE) ×8 IMPLANT
SYR 50ML SLIP (SYRINGE) IMPLANT
SYR 5ML LUER SLIP (SYRINGE) ×4 IMPLANT
TOWEL GREEN STERILE FF (TOWEL DISPOSABLE) ×4 IMPLANT
TRAP SPECIMEN MUCOUS 40CC (MISCELLANEOUS) IMPLANT
TUBE CONNECTING 20'X1/4 (TUBING) ×2
TUBE CONNECTING 20X1/4 (TUBING) ×6 IMPLANT
UNDERPAD 30X30 (UNDERPADS AND DIAPERS) ×4 IMPLANT
VALVE BIOPSY  SINGLE USE (MISCELLANEOUS) ×4
VALVE BIOPSY SINGLE USE (MISCELLANEOUS) ×2 IMPLANT
VALVE SUCTION BRONCHIO DISP (MISCELLANEOUS) ×4 IMPLANT
WATER STERILE IRR 1000ML POUR (IV SOLUTION) ×4 IMPLANT

## 2020-09-09 NOTE — Progress Notes (Signed)
09/09/2020 Pulmonary Progress Note  I have seen and evaluated the patient for lung mass, FTT  S:  No events, remains profoundly weak. Working diagnosis is catatonia.  O: Blood pressure (!) 168/106, pulse (!) 105, temperature 98.2 F (36.8 C), resp. rate (!) 28, height 5\' 1"  (1.549 m), weight 88.5 kg, SpO2 92 %.  Frail elderly woman lying in bad Profound muscle weakness Lungs clear Ext warm Slightly pale  Labs reviewed, stable  A:  L hilar lung mass in non smoker FTT Progressive weakness and working diagnosis of catatonia  P:  - Bronchoscopy today, patient agrees with risks/benefits as does daughter - f/u path and consider oncology and palliative consults - remainder of care per primary - will check on her tomorrow then be available PRN   09/09/2020 Erskine Emery MD

## 2020-09-09 NOTE — Procedures (Signed)
Flexible and EBUS Bronchoscopy Procedure Note  GIGI ONSTAD  [id number]  07-20-1933  Date:09/09/20  Time:9:47 AM   Provider Performing:Renell Allum C Tamala Julian   Procedure: Flexible bronchoscopy and EBUS Bronchoscopy  Indication(s) Left Hilar Mass  Consent Risks of the procedure as well as the alternatives and risks of each were explained to the patient and/or caregiver.  Consent for the procedure was obtained.  Anesthesia General Anesthesia   Time Out Verified patient identification, verified procedure, site/side was marked, verified correct patient position, special equipment/implants available, medications/allergies/relevant history reviewed, required imaging and test results available.   Sterile Technique Usual hand hygiene, masks, gowns, and gloves were used   Procedure Description Tthe EBUS bronchoscope was advanced into airway and left hilar mass was biopsied by FNA x 6.  The EBUS bronchoscope was removed after assuring no active bleeding from biopsy site.  Findings:  -Chronic bronchitis -Left hilar mass below     Complications/Tolerance None; patient tolerated the procedure well. Chest X-ray is not needed post procedure.   EBL Minimal   Specimen(s) FNA left hilar mass

## 2020-09-09 NOTE — Progress Notes (Signed)
PROGRESS NOTE    Ashlee Greer  OVF:[id number] DOB: [birth year] DOA: 08/30/2020 PCP: [name], MD  Chief Complaint  Patient presents with  . Loss of Consciousness   Brief Narrative:  Ashlee Greer Allredis [name] 87 y.o.femalewith medical history significant ofA. fib not on anticoagulation, anxiety/depression, arthritis, diabetes, glaucoma, hypertension, hypothyroidism who presented with recurrent loss of consciousness secondary to orthostatic hypotension.  Patient was recently discharged from [facility] after treatment for TIA, UTI, pneumonia. She also had some fatigue and orthostasis while she was there. She was discharged on Bactrim for UTI and Levaquin for her pneumonia. She was recommended to go to an SNF at discharge but family is an Therapist, sports and stated they would care for patient at home.  However, her BP remained low prior to discharge which concerned family.  Due to ongoing ortho hypoTN on returning home, family brought patient to the hospital.    Workup in the ER was notable for temp 101.4, tachypnea, tachycardia. CXR showed left basilar infiltrate vs atelectasis.  Lactic was elevated and she was started on IVF and cefepime. Covid and flu swab negative.  She continued to improve with antibiotics. She was evaluated by physical therapy and recommended for SNF rehab at discharge.  This was discussed with her family who was also in agreement.  Assessment & Plan:   Active Problems:   History of TIA (transient ischemic attack)   PAF (paroxysmal atrial fibrillation) (HCC)   Anxiety and depression   Diabetes ([place])   Hypothyroidism   Hand pain, right  Severe sepsis 2/2 Community Acquired Pneumonia (HCC)-resolved as of 09/04/2020 - now clinically improved on IV abx; okay to change to Azithro (continue flagyl) and will complete course (d/c cefepime) - follow up cultures: negative to date - finishing out 7 day course total   3.8 x 3.5 cm L hilar Mass      - she's completed  course of abx as noted above      - s/p flexible bronch and EBUS bronchoscopy 1/1      - follow FNA results   Acute Metabolic Encephalopathy       - she was independent ~2 weeks ago at home and has daily decline        - she's lethargic today when I see her, she recently received the ambien       - MRI at 12/16 at [facility] was negative for acute events       - head CT today without acute findings       - will get MRI brain (no acute intracranial abnormality, moderate chronic small vessel ischemic changes and generalized volume loss) and EEG (mild diffuse encephalopathy, nonspecific)       - TSH wnl, follow ammonia (wnl), B12 (wnl), RPR (nonreactive)       - appreciate neurology, 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 If related to general decline, encephalopathy, but she has marked LE weakness, unable to lift R heel off bed and just able to clear the L leg - with hx independence 2 weeks ago, will discuss with neurology regarding need for additional w/u        - appreciate neurology recs ->MRI with mild central canal stenosis and mild to oderate foraminal narrowing at T9-10, moderate L foraminal narrowing T10-11.  Convex L scoliosis.  Degenerative disc disease at L1-2 results in narrowing in  R subarticular recess (could impact descending R L2 root).  Moderate R foralminal narrowing and narrowing in R subarticular recess at L2-3 could impact descending R L3 and exiting R L2 roots.  Moderate central canal stenosis and L worse than R subarticular recess narrowing at L3-4.  Bilateral foraminal narrowing at this level.          - will follow with neurology -> note lateral recess narrowing at 3 of lumbar levels may contribute to asymmetry seen on motor exam of lower extremities -> thought catatonia may have unmasked prreviously compensated subclinical RLE motor deficit  Orthostatic hypotension-resolved as of 09/02/2020 -Also  possibly multifactorial in setting of infection as well as polypharmacy.  Her medications reviewed and she is on sedating medications including clonazepam as well as anticholinergic mechanisms with nortriptyline which will be held; Previous MD has also counseled her bedside that her clonazepam dose needs to be reduced at discharge and ideally would benefit from weaning off of it altogether (continue to taper given her AMS) -resume celexa at lower dose, 20 mg daily on 12/25 -Continue monitoring blood pressure which is improving -Repeat orthostatic blood pressure: negative on 12/24  History of Fall       - occurred prior to [place] hospitalization       - ? If related to R wrist pain (see below).  She has neck pain, will get cervical imaging (no fracture or subluxation)      - she's complaining of pain limiting lower extremity strength today -> bilateral hip and R knee film without acute finding (remote proximal fibula fx, tricompartment OA and chondrocalcinosis)  History of TIA (transient ischemic attack) - see afib - never received [facility] from [facility] and depression -See orthostatic hypotension -Clonazepam dose significantly reduced and holding nortriptyline -Celexa on hold but will try to resume soon  PAF (paroxysmal atrial fibrillation) ([place]) -Discussed bedside with patient; Eliquis made her confused in the past.  Denies any significant bleeding events.  She is open to alternative anticoagulation -Discussed that we would trial Xarelto (started 12/27) -Continue Coreg  Acute lower UTI-resolved as of 09/04/2020 -She does endorse some symptoms.  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 - wrist splint ordered -> needs repeat films in about 2  weeks  Hypothyroidism -Continue Synthroid  Diabetes ([place]) -Continue SSI and CBG monitoring - follow A1c (6.8)  DVT prophylaxis: xarelto Code Status: partial (no CPR, ok with intubation) Family Communication: daughter Disposition:   Status is: Inpatient  Remains inpatient appropriate because:Inpatient level of care appropriate due to severity of illness   Dispo: The patient is from: Home              Anticipated d/c is to: SNF              Anticipated d/c date is: > 3 days              Patient currently is not medically stable to d/c.  Consultants:   pulm  Procedures:  none  Antimicrobials:  Anti-infectives (From admission, onward)   Start     Dose/Rate Route Frequency Ordered Stop   09/02/20 1215  azithromycin (ZITHROMAX) tablet 500 mg        500 mg Oral Daily 09/02/20 1122  09/07/20 1055   08/31/20 1615  metroNIDAZOLE (FLAGYL) tablet 500 mg  Status:  Discontinued        500 mg Oral Every 8 hours 08/31/20 1608 09/06/20 1654   08/30/20 2100  ceFEPIme (MAXIPIME) 2 g in sodium chloride 0.9 % 100 mL IVPB  Status:  Discontinued        2 g 200 mL/hr over 30 Minutes Intravenous Every 12 hours 08/30/20 2011 09/02/20 1122     Subjective: Lethargic, awakes to voice and responds appropriately Objective: Vitals:   09/09/20 0850 09/09/20 0900 09/09/20 0910 09/09/20 1450  BP: 135/83 (!) 136/50 (!) 132/97 (!) 151/93  Pulse: 90 95 93 89  Resp: (!) 22 (!) 26 (!) 23 18  Temp:    98.6 F (37 C)  TempSrc:    Oral  SpO2: 93% 93% 93% 99%  Weight:      Height:        Intake/Output Summary (Last 24 hours) at 09/09/2020 1749 Last data filed at 09/09/2020 1630 Gross per 24 hour  Intake 620 ml  Output 1100 ml  Net -480 ml   Filed Weights   08/30/20 1748  Weight: 88.5 kg    Examination:  General: No acute distress. Cardiovascular: Heart sounds show Greggory Safranek regular rate, and rhythm. Lungs: Clear to auscultation bilaterally. Abdomen: Soft, nontender, nondistended Neurological:  Lethargic, responds appropriately after voice/touch, but quickly falls back asleep. Limited exam due to mental stauts. Skin: Warm and dry. No rashes or lesions. Extremities: No clubbing or cyanosis. No edema.   Data Reviewed: I have personally reviewed following labs and imaging studies  CBC: Recent Labs  Lab 09/05/20 0056 09/06/20 0834 09/07/20 0046 09/08/20 1437 09/09/20 0238  WBC 10.3 10.9* 11.6* 13.8* 13.1*  NEUTROABS 7.5 8.7* 8.9* 12.0* 10.6*  HGB 11.4* 12.1 11.6* 11.5* 11.3*  HCT 32.6* 36.4 35.4* 35.8* 35.4*  MCV 90.3 90.1 91.2 93.0 92.7  PLT 334 350 326 339 975    Basic Metabolic Panel: Recent Labs  Lab 09/04/20 0609 09/05/20 0231 09/06/20 0834 09/07/20 0046 09/08/20 0100 09/08/20 0853 09/09/20 0238  NA 134* 131* 130* 131*  --  133* 134*  K 4.8 4.4 4.3 4.3  --  4.6 4.5  CL 98 95* 95* 99  --  98 100  CO2 24 25 23 22   --  23 24  GLUCOSE 132* 136* 169* 144*  --  151* 157*  BUN 12 12 11 12   --  18 19  CREATININE 0.71 0.71 0.77 0.70  --  0.75 0.71  CALCIUM 8.5* 8.3* 8.4* 8.4*  --  8.0* 8.5*  MG 1.9 2.0  --  1.9 1.9  --  2.1  PHOS  --   --   --  2.8  --   --  3.2    GFR: Estimated Creatinine Clearance: 52 mL/min (by C-G formula based on SCr of 0.71 mg/dL).  Liver Function Tests: Recent Labs  Lab 09/06/20 0834 09/07/20 0046 09/08/20 0853 09/09/20 0238  AST 21 22 27  68*  ALT 22 17 19  37  ALKPHOS 71 62 65 81  BILITOT 0.7 0.5 0.4 0.6  PROT 6.5 6.2* 6.1* 6.4*  ALBUMIN 2.0* 1.8* 1.8* 1.8*    CBG: Recent Labs  Lab 09/08/20 2018 09/09/20 0658 09/09/20 1218 09/09/20 1641 09/09/20 1720  GLUCAP 270* 161* 185* 207* 198*     Recent Results (from the past 240 hour(s))  Blood culture (routine single)     Status: None   Collection Time: 08/30/20  6:33 PM   Specimen: BLOOD  Result Value Ref Range Status   Specimen Description BLOOD BLOOD RIGHT FOREARM  Final   Special Requests   Final    BOTTLES DRAWN AEROBIC ONLY Blood Culture results may not be  optimal due to an inadequate volume of blood received in culture bottles   Culture   Final    NO GROWTH 5 DAYS Performed at [facility], [place] [address], [place], [place] [address]    Report Status 09/04/2020 FINAL  Final  Resp Panel by RT-PCR (Flu Cartier Washko&B, Covid) Nasopharyngeal Swab     Status: None   Collection Time: 08/30/20  6:33 PM   Specimen: Nasopharyngeal Swab; Nasopharyngeal(NP) swabs in vial transport medium  Result Value Ref Range Status   SARS Coronavirus 2 by RT PCR NEGATIVE NEGATIVE Final    Comment: (NOTE) SARS-CoV-2 target nucleic acids are NOT DETECTED.  The SARS-CoV-2 RNA is generally detectable in upper respiratory specimens during the acute phase of infection. The lowest concentration of SARS-CoV-2 viral copies this assay can detect is 138 copies/mL. Shubham Thackston negative result does not preclude SARS-Cov-2 infection and should not be used as the sole basis for treatment or other patient management decisions. Lashica Hannay negative result may occur with  improper specimen collection/handling, submission of specimen other than nasopharyngeal swab, presence of viral mutation(s) within the areas targeted by this assay, and inadequate number of viral copies(<138 copies/mL). Leon Goodnow negative result must be combined with clinical observations, patient history, and epidemiological information. The expected result is Negative.  Fact Sheet for Patients:  EntrepreneurPulse.com.au  Fact Sheet for Healthcare Providers:  IncredibleEmployment.be  This test is no t yet approved or cleared by the [place] FDA and  has been authorized for detection and/or diagnosis of SARS-CoV-2 by FDA under an Emergency Use Authorization (EUA). This EUA will remain  in effect (meaning this test can be used) for the duration of the COVID-19 declaration under Section 564(b)(1) of the Act, 21 U.S.C.section 360bbb-3(b)(1), unless the authorization is terminated  or revoked sooner.        Influenza Tanaiya Kolarik by PCR NEGATIVE NEGATIVE Final   Influenza B by PCR NEGATIVE NEGATIVE Final    Comment: (NOTE) The Xpert Xpress SARS-CoV-2/FLU/RSV plus assay is intended as an aid in the diagnosis of influenza from Nasopharyngeal swab specimens and should not be used as Wille Aubuchon sole basis for treatment. Nasal washings and aspirates are unacceptable for Xpert Xpress SARS-CoV-2/FLU/RSV testing.  Fact Sheet for Patients: EntrepreneurPulse.com.au  Fact Sheet for Healthcare Providers: IncredibleEmployment.be  This test is not yet approved or cleared by the [place] FDA and has been authorized for detection and/or diagnosis of SARS-CoV-2 by FDA under an Emergency Use Authorization (EUA). This EUA will remain in effect (meaning this test can be used) for the duration of the COVID-19 declaration under Section 564(b)(1) of the Act, 21 U.S.C. section 360bbb-3(b)(1), unless the authorization is terminated or revoked.  Performed at [facility], [place] [address] [place], [place] [address]   Culture, blood (single)     Status: None   Collection Time: 08/30/20  8:25 PM   Specimen: BLOOD RIGHT HAND  Result Value Ref Range Status   Specimen Description BLOOD RIGHT HAND  Final   Special Requests   Final    BOTTLES DRAWN AEROBIC AND ANAEROBIC Blood Culture results may not be optimal due to an inadequate volume of blood received in culture bottles   Culture   Final    NO GROWTH 5  DAYS Performed at [facility], [place] [address] [place], [place] [address]    Report Status 09/04/2020 FINAL  Final  MRSA PCR Screening     Status: None   Collection Time: 08/31/20  1:07 AM   Specimen: Nasal Mucosa; Nasopharyngeal  Result Value Ref Range Status   MRSA by PCR NEGATIVE NEGATIVE Final    Comment:        The GeneXpert MRSA Assay (FDA approved for NASAL specimens only), is one component of Priyah Schmuck comprehensive MRSA colonization surveillance program. It is  not intended to diagnose MRSA infection nor to guide or monitor treatment for MRSA infections. Performed at [facility], [place] [address] [place], [place] [address]   Respiratory Panel by PCR     Status: None   Collection Time: 09/01/20  1:48 PM   Specimen: Nasopharyngeal Swab; Respiratory  Result Value Ref Range Status   Adenovirus NOT DETECTED NOT DETECTED Final   Coronavirus 229E NOT DETECTED NOT DETECTED Final    Comment: (NOTE) The Coronavirus on the Respiratory Panel, DOES NOT test for the novel  Coronavirus (2019 nCoV)    Coronavirus HKU1 NOT DETECTED NOT DETECTED Final   Coronavirus NL63 NOT DETECTED NOT DETECTED Final   Coronavirus OC43 NOT DETECTED NOT DETECTED Final   Metapneumovirus NOT DETECTED NOT DETECTED Final   Rhinovirus / Enterovirus NOT DETECTED NOT DETECTED Final   Influenza Shelina Luo NOT DETECTED NOT DETECTED Final   Influenza B NOT DETECTED NOT DETECTED Final   Parainfluenza Virus 1 NOT DETECTED NOT DETECTED Final   Parainfluenza Virus 2 NOT DETECTED NOT DETECTED Final   Parainfluenza Virus 3 NOT DETECTED NOT DETECTED Final   Parainfluenza Virus 4 NOT DETECTED NOT DETECTED Final   Respiratory Syncytial Virus NOT DETECTED NOT DETECTED Final   Bordetella pertussis NOT DETECTED NOT DETECTED Final   Bordetella Parapertussis NOT DETECTED NOT DETECTED Final   Chlamydophila pneumoniae NOT DETECTED NOT DETECTED Final   Mycoplasma pneumoniae NOT DETECTED NOT DETECTED Final    Comment: Performed at [facility], [place]. [address] [place], [place] [address]  Resp Panel by RT-PCR (Flu Margueritte Guthridge&B, Covid) Nasopharyngeal Swab     Status: None   Collection Time: 09/05/20  4:47 PM   Specimen: Nasopharyngeal Swab; Nasopharyngeal(NP) swabs in vial transport medium  Result Value Ref Range Status   SARS Coronavirus 2 by RT PCR NEGATIVE NEGATIVE Final    Comment: (NOTE) SARS-CoV-2 target nucleic acids are NOT DETECTED.  The SARS-CoV-2 RNA is generally detectable in upper  respiratory specimens during the acute phase of infection. The lowest concentration of SARS-CoV-2 viral copies this assay can detect is 138 copies/mL. Dennis Killilea negative result does not preclude SARS-Cov-2 infection and should not be used as the sole basis for treatment or other patient management decisions. Hakiem Malizia negative result may occur with  improper specimen collection/handling, submission of specimen other than nasopharyngeal swab, presence of viral mutation(s) within the areas targeted by this assay, and inadequate number of viral copies(<138 copies/mL). Federick Levene negative result must be combined with clinical observations, patient history, and epidemiological information. The expected result is Negative.  Fact Sheet for Patients:  EntrepreneurPulse.com.au  Fact Sheet for Healthcare Providers:  IncredibleEmployment.be  This test is no t yet approved or cleared by the [place] FDA and  has been authorized for detection and/or diagnosis of SARS-CoV-2 by FDA under an Emergency Use Authorization (EUA). This EUA will remain  in effect (meaning this test can be used) for the duration of  the COVID-19 declaration under Section 564(b)(1) of the Act, 21 U.S.C.section 360bbb-3(b)(1), unless the authorization is terminated  or revoked sooner.       Influenza Issabela Lesko by PCR NEGATIVE NEGATIVE Final   Influenza B by PCR NEGATIVE NEGATIVE Final    Comment: (NOTE) The Xpert Xpress SARS-CoV-2/FLU/RSV plus assay is intended as an aid in the diagnosis of influenza from Nasopharyngeal swab specimens and should not be used as Dyllan Hughett sole basis for treatment. Nasal washings and aspirates are unacceptable for Xpert Xpress SARS-CoV-2/FLU/RSV testing.  Fact Sheet for Patients: EntrepreneurPulse.com.au  Fact Sheet for Healthcare Providers: IncredibleEmployment.be  This test is not yet approved or cleared by the [place] FDA and has been  authorized for detection and/or diagnosis of SARS-CoV-2 by FDA under an Emergency Use Authorization (EUA). This EUA will remain in effect (meaning this test can be used) for the duration of the COVID-19 declaration under Section 564(b)(1) of the Act, 21 U.S.C. section 360bbb-3(b)(1), unless the authorization is terminated or revoked.  Performed at [facility], [place] [address], [place],  [address]          Radiology Studies: MR THORACIC SPINE WO CONTRAST  Result Date: 09/08/2020 CLINICAL DATA:  Bilateral upper and lower extremity weakness and pain. EXAM: MRI THORACIC AND LUMBAR SPINE WITHOUT CONTRAST TECHNIQUE: Multiplanar and multiecho pulse sequences of the thoracic and lumbar spine were obtained without intravenous contrast. COMPARISON:  CT chest 09/06/2020.  CT abdomen and pelvis 06/21/2017. FINDINGS: MRI THORACIC SPINE FINDINGS Alignment: 0.3 cm anterolisthesis C7 on T1 and trace anterolisthesis T9 on T10. Vertebrae: No fracture, evidence of discitis, or bone lesion. Scattered small Schmorl's nodes are noted. Mild degenerative endplate signal change is seen anteriorly at T9-10 and T10-11. Cord:  Normal signal throughout. Paraspinal and other soft tissues: Left hilar mass is better demonstrated on the patient's recent chest CT. Otherwise negative. Disc levels: T1-2: There is loss of disc space height with Harbor Paster shallow bulge and endplate spur. The central canal is open. Moderate bilateral foraminal narrowing is noted. T2-3: Tiny central protrusion without stenosis. T3-4: Negative. T4-5: Minimal bulge without stenosis. T5-6: Negative. T6-7: Minimal bulge without stenosis. T7-8: Minimal bulge without stenosis. T8-9: Moderate facet arthropathy.  Shallow disc bulge.  No stenosis. T9-10: Moderate facet arthritis moderate to moderately severe facet degenerative change, worse on the left. The disc is uncovered with Aman Bonet shallow bulge and endplate spur and there is some ligamentum flavum  thickening. There is mild central canal stenosis. Mild to moderate foraminal narrowing is worse on the left. T10-11: Left worse than right facet degenerative disease. Minimal disc bulge. Moderate left foraminal narrowing. The central canal and right foramen are open. T11-12: Facet degenerative change.  Otherwise negative. T12-L1: Facet degenerative disease and Liel Rudden minimal disc bulge. No stenosis. MRI LUMBAR SPINE FINDINGS Segmentation:  Standard. Alignment: There is convex left scoliosis with the apex at approximately L2-3 Vertebrae:  No fracture, evidence of discitis, or bone lesion. Conus medullaris and cauda equina: Conus extends to the L2 level. Conus and cauda equina appear normal. Paraspinal and other soft tissues: Negative. Disc levels: L1-2: There is loss of disc space height. Disc bulge and endplate spur are more prominent to the right. There is also some ligamentum flavum thickening which is worse on the right. Narrowing in the right lateral recess could impact the descending right L2 root. Foramina are open. L2-3: Central protrusion and endplate spur. There is some narrowing in the right subarticular recess and moderate right foraminal narrowing. The  left foramen is open. Facet degenerative change is more notable on the right. L3-4: Bilateral facet arthropathy. Broad-based disc bulge and endplate spur. Vacuum disc phenomenon. Moderate central canal stenosis and left worse than right subarticular recess narrowing. Moderately severe bilateral foraminal narrowing. L4-5: There is autologous fusion across the disc interspace and the facets are ankylosed. No bulge or protrusion. No stenosis. L5-S1: Autologous fusion across the disc interspace and ankylosis of the left facets. No stenosis. IMPRESSION: MR THORACIC SPINE IMPRESSION Known left hilar mass is better demonstrated on recent chest CT. Mild central canal stenosis and mild to moderate foraminal narrowing at T9-10. Facet degenerative change at this level is  worse on the left. Moderate left foraminal narrowing T10-11. MR LUMBAR SPINE IMPRESSION Convex left scoliosis. Degenerative disc disease at L1-2 results in narrowing in the right subarticular recess which could impact the descending right L2 root. Moderate right foraminal narrowing and narrowing in the right subarticular recess at L2-3 could impact the descending right L3 and exiting right L2 roots. Moderate central canal stenosis and left worse than right subarticular recess narrowing at L3-4. There is also moderately severe bilateral foraminal narrowing at this level. Electronically Signed   By: Inge Rise M.D.   On: 09/08/2020 11:23   MR LUMBAR SPINE WO CONTRAST  Result Date: 09/08/2020 CLINICAL DATA:  Bilateral upper and lower extremity weakness and pain. EXAM: MRI THORACIC AND LUMBAR SPINE WITHOUT CONTRAST TECHNIQUE: Multiplanar and multiecho pulse sequences of the thoracic and lumbar spine were obtained without intravenous contrast. COMPARISON:  CT chest 09/06/2020.  CT abdomen and pelvis 06/21/2017. FINDINGS: MRI THORACIC SPINE FINDINGS Alignment: 0.3 cm anterolisthesis C7 on T1 and trace anterolisthesis T9 on T10. Vertebrae: No fracture, evidence of discitis, or bone lesion. Scattered small Schmorl's nodes are noted. Mild degenerative endplate signal change is seen anteriorly at T9-10 and T10-11. Cord:  Normal signal throughout. Paraspinal and other soft tissues: Left hilar mass is better demonstrated on the patient's recent chest CT. Otherwise negative. Disc levels: T1-2: There is loss of disc space height with Deaveon Schoen shallow bulge and endplate spur. The central canal is open. Moderate bilateral foraminal narrowing is noted. T2-3: Tiny central protrusion without stenosis. T3-4: Negative. T4-5: Minimal bulge without stenosis. T5-6: Negative. T6-7: Minimal bulge without stenosis. T7-8: Minimal bulge without stenosis. T8-9: Moderate facet arthropathy.  Shallow disc bulge.  No stenosis. T9-10: Moderate  facet arthritis moderate to moderately severe facet degenerative change, worse on the left. The disc is uncovered with Gerrard Crystal shallow bulge and endplate spur and there is some ligamentum flavum thickening. There is mild central canal stenosis. Mild to moderate foraminal narrowing is worse on the left. T10-11: Left worse than right facet degenerative disease. Minimal disc bulge. Moderate left foraminal narrowing. The central canal and right foramen are open. T11-12: Facet degenerative change.  Otherwise negative. T12-L1: Facet degenerative disease and Channon Brougher minimal disc bulge. No stenosis. MRI LUMBAR SPINE FINDINGS Segmentation:  Standard. Alignment: There is convex left scoliosis with the apex at approximately L2-3 Vertebrae:  No fracture, evidence of discitis, or bone lesion. Conus medullaris and cauda equina: Conus extends to the L2 level. Conus and cauda equina appear normal. Paraspinal and other soft tissues: Negative. Disc levels: L1-2: There is loss of disc space height. Disc bulge and endplate spur are more prominent to the right. There is also some ligamentum flavum thickening which is worse on the right. Narrowing in the right lateral recess could impact the descending right L2 root. Foramina are open. L2-3: Central  protrusion and endplate spur. There is some narrowing in the right subarticular recess and moderate right foraminal narrowing. The left foramen is open. Facet degenerative change is more notable on the right. L3-4: Bilateral facet arthropathy. Broad-based disc bulge and endplate spur. Vacuum disc phenomenon. Moderate central canal stenosis and left worse than right subarticular recess narrowing. Moderately severe bilateral foraminal narrowing. L4-5: There is autologous fusion across the disc interspace and the facets are ankylosed. No bulge or protrusion. No stenosis. L5-S1: Autologous fusion across the disc interspace and ankylosis of the left facets. No stenosis. IMPRESSION: MR THORACIC SPINE  IMPRESSION Known left hilar mass is better demonstrated on recent chest CT. Mild central canal stenosis and mild to moderate foraminal narrowing at T9-10. Facet degenerative change at this level is worse on the left. Moderate left foraminal narrowing T10-11. MR LUMBAR SPINE IMPRESSION Convex left scoliosis. Degenerative disc disease at L1-2 results in narrowing in the right subarticular recess which could impact the descending right L2 root. Moderate right foraminal narrowing and narrowing in the right subarticular recess at L2-3 could impact the descending right L3 and exiting right L2 roots. Moderate central canal stenosis and left worse than right subarticular recess narrowing at L3-4. There is also moderately severe bilateral foraminal narrowing at this level. Electronically Signed   By: Inge Rise M.D.   On: 09/08/2020 11:23        Scheduled Meds: . carvedilol  3.125 mg Oral BID WC  . citalopram  20 mg Oral QHS  . clonazepam  0.25 mg Oral QHS  . donepezil  10 mg Oral QHS  . insulin aspart  0-15 Units Subcutaneous TID WC  . latanoprost  1 drop Both Eyes QHS  . levothyroxine  50 mcg Oral QAC breakfast  . pantoprazole  40 mg Oral Daily  . sodium chloride flush  3 mL Intravenous Q12H  . zolpidem  5 mg Oral Daily   Continuous Infusions:   LOS: 9 days    Time spent: over 30 min    Fayrene Helper, MD [facility]   To contact the attending provider between 7A-7P or the covering provider during after hours 7P-7A, please log into the web site [email or web address] and access using universal  password for that web site. If you do not have the password, please call the hospital operator.  09/09/2020, 5:49 PM

## 2020-09-09 NOTE — Anesthesia Postprocedure Evaluation (Signed)
Anesthesia Post Note  Patient: LARENE ASCENCIO  Procedure(s) Performed: VIDEO BRONCHOSCOPY WITH ENDOBRONCHIAL ULTRASOUND (Left ) FINE NEEDLE ASPIRATION (FNA) LINEAR     Patient location during evaluation: PACU Anesthesia Type: General Level of consciousness: awake and alert Pain management: pain level controlled Vital Signs Assessment: post-procedure vital signs reviewed and stable Respiratory status: spontaneous breathing, nonlabored ventilation, respiratory function stable and patient connected to nasal cannula oxygen Cardiovascular status: blood pressure returned to baseline and stable Postop Assessment: no apparent nausea or vomiting Anesthetic complications: no   No complications documented.  Last Vitals:  Vitals:   09/09/20 1450 09/09/20 2032  BP: (!) 151/93 (!) 141/81  Pulse: 89 83  Resp: 18 17  Temp: 37 C (!) 36.4 C  SpO2: 99% 93%    Last Pain:  Vitals:   09/09/20 2032  TempSrc: Oral  PainSc:                  Catalina Gravel

## 2020-09-09 NOTE — Anesthesia Procedure Notes (Signed)
Procedure Name: Intubation Date/Time: 09/09/2020 7:55 AM Performed by: Imagene Riches, CRNA Pre-anesthesia Checklist: Patient identified, Emergency Drugs available, Suction available and Patient being monitored Patient Re-evaluated:Patient Re-evaluated prior to induction Oxygen Delivery Method: Circle System Utilized Preoxygenation: Pre-oxygenation with 100% oxygen Induction Type: IV induction Ventilation: Mask ventilation without difficulty Laryngoscope Size: Miller and 2 Grade View: Grade I Tube type: Oral Tube size: 8.5 mm Number of attempts: 1 Airway Equipment and Method: Stylet and Oral airway Placement Confirmation: ETT inserted through vocal cords under direct vision,  positive ETCO2 and breath sounds checked- equal and bilateral Secured at: 21 cm Tube secured with: Tape Dental Injury: Teeth and Oropharynx as per pre-operative assessment

## 2020-09-09 NOTE — Transfer of Care (Signed)
Immediate Anesthesia Transfer of Care Note  Patient: Ashlee Greer  Procedure(s) Performed: VIDEO BRONCHOSCOPY WITH ENDOBRONCHIAL ULTRASOUND (Left ) FINE NEEDLE ASPIRATION (FNA) LINEAR  Patient Location: PACU  Anesthesia Type:General  Level of Consciousness: drowsy  Airway & Oxygen Therapy: Patient Spontanous Breathing and Patient connected to nasal cannula oxygen  Post-op Assessment: Report given to RN and Post -op Vital signs reviewed and stable  Post vital signs: Reviewed and stable  Last Vitals:  Vitals Value Taken Time  BP 132/71 09/09/20 0837  Temp 36.9 C 09/09/20 0837  Pulse 90 09/09/20 0841  Resp 20 09/09/20 0841  SpO2 95 % 09/09/20 0841  Vitals shown include unvalidated device data.  Last Pain:  Vitals:   09/09/20 0837  TempSrc: Oral  PainSc: 0-No pain         Complications: No complications documented.

## 2020-09-10 ENCOUNTER — Inpatient Hospital Stay (HOSPITAL_COMMUNITY): Payer: Medicare Other

## 2020-09-10 DIAGNOSIS — A419 Sepsis, unspecified organism: Secondary | ICD-10-CM | POA: Diagnosis not present

## 2020-09-10 DIAGNOSIS — M79609 Pain in unspecified limb: Secondary | ICD-10-CM

## 2020-09-10 DIAGNOSIS — M7989 Other specified soft tissue disorders: Secondary | ICD-10-CM | POA: Diagnosis not present

## 2020-09-10 DIAGNOSIS — R6520 Severe sepsis without septic shock: Secondary | ICD-10-CM | POA: Diagnosis not present

## 2020-09-10 LAB — COMPREHENSIVE METABOLIC PANEL
ALT: 35 U/L (ref 0–44)
AST: 47 U/L — ABNORMAL HIGH (ref 15–41)
Albumin: 1.7 g/dL — ABNORMAL LOW (ref 3.5–5.0)
Alkaline Phosphatase: 70 U/L (ref 38–126)
Anion gap: 11 (ref 5–15)
BUN: 25 mg/dL — ABNORMAL HIGH (ref 8–23)
CO2: 24 mmol/L (ref 22–32)
Calcium: 8.6 mg/dL — ABNORMAL LOW (ref 8.9–10.3)
Chloride: 100 mmol/L (ref 98–111)
Creatinine, Ser: 0.72 mg/dL (ref 0.44–1.00)
GFR, Estimated: >60 mL/min (ref >60)
Glucose, Bld: 190 mg/dL — ABNORMAL HIGH (ref 70–99)
Potassium: 5.1 mmol/L (ref 3.5–5.1)
Sodium: 135 mmol/L (ref 135–145)
Total Bilirubin: 0.6 mg/dL (ref 0.3–1.2)
Total Protein: 6.1 g/dL — ABNORMAL LOW (ref 6.5–8.1)

## 2020-09-10 LAB — CBC WITH DIFFERENTIAL/PLATELET
Abs Immature Granulocytes: 0.06 10*3/uL (ref 0.00–0.07)
Basophils Absolute: 0 10*3/uL (ref 0.0–0.1)
Basophils Relative: 0 %
Eosinophils Absolute: 0 10*3/uL (ref 0.0–0.5)
Eosinophils Relative: 0 %
HCT: 32.9 % — ABNORMAL LOW (ref 36.0–46.0)
Hemoglobin: 10.4 g/dL — ABNORMAL LOW (ref 12.0–15.0)
Immature Granulocytes: 1 %
Lymphocytes Relative: 8 %
Lymphs Abs: 0.9 10*3/uL (ref 0.7–4.0)
MCH: 29.7 pg (ref 26.0–34.0)
MCHC: 31.6 g/dL (ref 30.0–36.0)
MCV: 94 fL (ref 80.0–100.0)
Monocytes Absolute: 0.3 10*3/uL (ref 0.1–1.0)
Monocytes Relative: 2 %
Neutro Abs: 9.5 10*3/uL — ABNORMAL HIGH (ref 1.7–7.7)
Neutrophils Relative %: 89 %
Platelets: 320 10*3/uL (ref 150–400)
RBC: 3.5 MIL/uL — ABNORMAL LOW (ref 3.87–5.11)
RDW: 15.1 % (ref 11.5–15.5)
WBC: 10.7 10*3/uL — ABNORMAL HIGH (ref 4.0–10.5)
nRBC: 0 % (ref 0.0–0.2)

## 2020-09-10 LAB — GLUCOSE, CAPILLARY
Glucose-Capillary: 212 mg/dL — ABNORMAL HIGH (ref 70–99)
Glucose-Capillary: 135 mg/dL — ABNORMAL HIGH (ref 70–99)
Glucose-Capillary: 136 mg/dL — ABNORMAL HIGH (ref 70–99)
Glucose-Capillary: 155 mg/dL — ABNORMAL HIGH (ref 70–99)

## 2020-09-10 LAB — MAGNESIUM: Magnesium: 2.1 mg/dL (ref 1.7–2.4)

## 2020-09-10 LAB — PHOSPHORUS: Phosphorus: 3.9 mg/dL (ref 2.5–4.6)

## 2020-09-10 NOTE — Progress Notes (Signed)
PROGRESS NOTE    Ashlee Greer  QVZ:[id number] DOB: [birth year] DOA: 08/30/2020 PCP: [name], MD  Chief Complaint  Patient presents with  . Loss of Consciousness   Brief Narrative:  Ashlee Greer 87 y.o.femalewith medical history significant ofA. fib not on anticoagulation, anxiety/depression, arthritis, diabetes, glaucoma, hypertension, hypothyroidism who presented with recurrent loss of consciousness secondary to orthostatic hypotension.  Patient was recently discharged from [facility] after treatment for TIA, UTI, pneumonia. She also had some fatigue and orthostasis while she was there. She was discharged on Bactrim for UTI and Levaquin for her pneumonia. She was recommended to go to an SNF at discharge but family is an Therapist, sports and stated they would care for patient at home.  However, her BP remained low prior to discharge which concerned family.  Due to ongoing ortho hypoTN on returning home, family brought patient to the hospital.    Workup in the ER was notable for temp 101.4, tachypnea, tachycardia. CXR showed left basilar infiltrate vs atelectasis.  Lactic was elevated and she was started on IVF and cefepime. Covid and flu swab negative.  She continued to improve with antibiotics. She was evaluated by physical therapy and recommended for SNF rehab at discharge.  This was discussed with her family who was also in agreement.  Assessment & Plan:   Active Problems:   History of TIA (transient ischemic attack)   PAF (paroxysmal atrial fibrillation) (HCC)   Anxiety and depression   Diabetes (HCC)   Hypothyroidism   Hand pain, right  Severe sepsis 2/2 Community Acquired Pneumonia (HCC)-resolved as of 09/04/2020 - now clinically improved on IV abx; okay to change to Azithro (continue flagyl) and will complete course (d/c cefepime) - follow up cultures: negative to date - completed course  3.8 x 3.5 cm L hilar Mass      - she's completed course of abx as  noted above      - s/p flexible bronch and EBUS bronchoscopy 1/1      - follow FNA results - cytology is pending  Acute Metabolic Encephalopathy       - she was independent ~2 weeks ago at home and has daily decline        - she's lethargic today when I see her, she recently received the ambien       - MRI at 12/16 at [facility] was negative for acute events       - head CT today without acute findings       - will get MRI brain (no acute intracranial abnormality, moderate chronic small vessel ischemic changes and generalized volume loss) and EEG (mild diffuse encephalopathy, nonspecific)       - TSH wnl, follow ammonia (wnl), B12 (wnl), RPR (nonreactive)       - appreciate neurology, 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 If related to general decline, encephalopathy, but she has marked LE weakness, unable to lift R heel off bed and just able to clear the L leg - with hx independence 2 weeks ago, will discuss with neurology regarding need for additional w/u        - appreciate neurology recs ->MRI with mild central canal stenosis and mild to oderate foraminal narrowing at T9-10, moderate L foraminal narrowing T10-11.  Convex L scoliosis.  Degenerative disc disease at L1-2 results in  narrowing in R subarticular recess (could impact descending R L2 root).  Moderate R foralminal narrowing and narrowing in R subarticular recess at L2-3 could impact descending R L3 and exiting R L2 roots.  Moderate central canal stenosis and L worse than R subarticular recess narrowing at L3-4.  Bilateral foraminal narrowing at this level.          - will follow with neurology -> note lateral recess narrowing at 3 of lumbar levels may contribute to asymmetry seen on motor exam of lower extremities -> thought catatonia may have unmasked prreviously compensated subclinical RLE motor deficit  Orthostatic hypotension-resolved  as of 09/02/2020 -Also possibly multifactorial in setting of infection as well as polypharmacy.  Her medications reviewed and she is on sedating medications including clonazepam as well as anticholinergic mechanisms with nortriptyline which will be held; Previous MD has also counseled her bedside that her clonazepam dose needs to be reduced at discharge and ideally would benefit from weaning off of it altogether (continue to taper given her AMS) -resume celexa at lower dose, 20 mg daily on 12/25 -Continue monitoring blood pressure which is improving -Repeat orthostatic blood pressure: negative on 12/24  History of Fall       - occurred prior to [place] hospitalization       - ? If related to R wrist pain (see below).  She has neck pain, will get cervical imaging (no fracture or subluxation)      - she's complaining of pain limiting lower extremity strength today -> bilateral hip and R knee film without acute finding (remote proximal fibula fx, tricompartment OA and chondrocalcinosis)  History of TIA (transient ischemic attack) - see afib - never received records from [facility] and depression -See orthostatic hypotension -Clonazepam dose significantly reduced and holding nortriptyline -Celexa on hold but will try to resume soon  PAF (paroxysmal atrial fibrillation) ([place]) -Discussed bedside with patient; Eliquis made her confused in the past.  Denies any significant bleeding events.  She is open to alternative anticoagulation -Discussed that we would trial Xarelto (started 12/27) -Continue Coreg  Acute lower UTI-resolved as of 09/04/2020 -She does endorse some symptoms.  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  Consultants:   pulm  Procedures:  none  Antimicrobials:  Anti-infectives (From admission, onward)   Start     Dose/Rate Route Frequency Ordered Stop   09/02/20 1215  azithromycin (ZITHROMAX) tablet 500 mg        500 mg Oral Daily  09/02/20 1122 09/07/20 1055   08/31/20 1615  metroNIDAZOLE (FLAGYL) tablet 500 mg  Status:  Discontinued        500 mg Oral Every 8 hours 08/31/20 1608 09/06/20 1654   08/30/20 2100  ceFEPIme (MAXIPIME) 2 g in sodium chloride 0.9 % 100 mL IVPB  Status:  Discontinued        2 g 200 mL/hr over 30 Minutes Intravenous Every 12 hours 08/30/20 2011 09/02/20 1122     Subjective: Ella Guillotte&Ox1-2 today (knew [place], difficulty with which hospital - didn't know month) Denies pain   Objective: Vitals:   09/09/20 0910 09/09/20 1450 09/09/20 2032 09/10/20 0448  BP: (!) 132/97 (!) 151/93 (!) 141/81 130/80  Pulse: 93 89 83 80  Resp: (!) 23 18 17 16   Temp:  98.6 F (37 C) (!) 97.5 F (36.4 C) 97.6 F (36.4 C)  TempSrc:  Oral Oral Oral  SpO2: 93% 99% 93% 95%  Weight:      Height:        Intake/Output Summary (Last 24 hours) at 09/10/2020 0957 Last data filed at 09/10/2020 0730 Gross per 24 hour  Intake 237 ml  Output 500 ml  Net -263 ml   Filed Weights   08/30/20 1748  Weight: 88.5 kg    Examination:  General: No acute distress. Cardiovascular: RRR Lungs: Clear to auscultation  bilaterally Abdomen: Soft, nontender, nondistended Neurological: Alert and oriented 1-2. Moves all extremities 4.  Antigravity to upper extremities bilaterally, lower extremities improved, able to lift left leg higher than previous days, able to barely clear right heel. Cranial nerves II through XII grossly intact. Skin: Warm and dry. No rashes or lesions. Extremities: No clubbing or cyanosis. No edema.   Data Reviewed: I have personally reviewed following labs and imaging studies  CBC: Recent Labs  Lab 09/06/20 0834 09/07/20 0046 09/08/20 1437 09/09/20 0238 09/10/20 0139  WBC 10.9* 11.6* 13.8* 13.1* 10.7*  NEUTROABS 8.7* 8.9* 12.0* 10.6* 9.5*  HGB 12.1 11.6* 11.5* 11.3* 10.4*  HCT 36.4 35.4* 35.8* 35.4* 32.9*  MCV 90.1 91.2 93.0 92.7 94.0  PLT 350 326 339 340 622    Basic Metabolic Panel: Recent Labs  Lab 09/05/20 0231 09/06/20 0834 09/07/20 0046 09/08/20 0100 09/08/20 0853 09/09/20 0238 09/10/20 0139  NA 131* 130* 131*  --  133* 134* 135  K 4.4 4.3 4.3  --  4.6 4.5 5.1  CL 95* 95* 99  --  98 100 100  CO2 25 23 22   --  23 24 24   GLUCOSE 136* 169* 144*  --  151* 157* 190*  BUN 12 11 12   --  18 19 25*  CREATININE 0.71 0.77 0.70  --  0.75 0.71 0.72  CALCIUM 8.3* 8.4* 8.4*  --  8.0* 8.5* 8.6*  MG 2.0  --  1.9 1.9  --  2.1 2.1  PHOS  --   --  2.8  --   --  3.2 3.9    GFR: Estimated Creatinine Clearance: 52 mL/min (by C-G formula based on SCr of 0.72 mg/dL).  Liver Function Tests: Recent Labs  Lab 09/06/20 0834 09/07/20 0046 09/08/20 0853 09/09/20 0238 09/10/20 0139  AST 21 22 27  68* 47*  ALT 22 17 19  37 35  ALKPHOS 71 62 65 81 70  BILITOT 0.7 0.5 0.4 0.6 0.6  PROT 6.5 6.2* 6.1* 6.4* 6.1*  ALBUMIN 2.0* 1.8* 1.8* 1.8* 1.7*    CBG: Recent Labs  Lab 09/09/20 1218 09/09/20 1641 09/09/20 1720 09/09/20  2107 09/10/20 0739  GLUCAP 185* 207* 198* 157* 212*     Recent Results (from the past 240 hour(s))  Respiratory Panel by PCR     Status: None    Collection Time: 09/01/20  1:48 PM   Specimen: Nasopharyngeal Swab; Respiratory  Result Value Ref Range Status   Adenovirus NOT DETECTED NOT DETECTED Final   Coronavirus 229E NOT DETECTED NOT DETECTED Final    Comment: (NOTE) The Coronavirus on the Respiratory Panel, DOES NOT test for the novel  Coronavirus (2019 nCoV)    Coronavirus HKU1 NOT DETECTED NOT DETECTED Final   Coronavirus NL63 NOT DETECTED NOT DETECTED Final   Coronavirus OC43 NOT DETECTED NOT DETECTED Final   Metapneumovirus NOT DETECTED NOT DETECTED Final   Rhinovirus / Enterovirus NOT DETECTED NOT DETECTED Final   Influenza Soundra Lampley NOT DETECTED NOT DETECTED Final   Influenza B NOT DETECTED NOT DETECTED Final   Parainfluenza Virus 1 NOT DETECTED NOT DETECTED Final   Parainfluenza Virus 2 NOT DETECTED NOT DETECTED Final   Parainfluenza Virus 3 NOT DETECTED NOT DETECTED Final   Parainfluenza Virus 4 NOT DETECTED NOT DETECTED Final   Respiratory Syncytial Virus NOT DETECTED NOT DETECTED Final   Bordetella pertussis NOT DETECTED NOT DETECTED Final   Bordetella Parapertussis NOT DETECTED NOT DETECTED Final   Chlamydophila pneumoniae NOT DETECTED NOT DETECTED Final   Mycoplasma pneumoniae NOT DETECTED NOT DETECTED Final    Comment: Performed at [facility], [place] [address] [place], [place] [address]  Resp Panel by RT-PCR (Flu Toshua Honsinger&B, Covid) Nasopharyngeal Swab     Status: None   Collection Time: 09/05/20  4:47 PM   Specimen: Nasopharyngeal Swab; Nasopharyngeal(NP) swabs in vial transport medium  Result Value Ref Range Status   SARS Coronavirus 2 by RT PCR NEGATIVE NEGATIVE Final    Comment: (NOTE) SARS-CoV-2 target nucleic acids are NOT DETECTED.  The SARS-CoV-2 RNA is generally detectable in upper respiratory specimens during the acute phase of infection. The lowest concentration of SARS-CoV-2 viral copies this assay can detect is 138 copies/mL. Delmar Dondero negative result does not preclude SARS-Cov-2 infection and should not be  used as the sole basis for treatment or other patient management decisions. Shaydon Lease negative result may occur with  improper specimen collection/handling, submission of specimen other than nasopharyngeal swab, presence of viral mutation(s) within the areas targeted by this assay, and inadequate number of viral copies(<138 copies/mL). Artist Bloom negative result must be combined with clinical observations, patient history, and epidemiological information. The expected result is Negative.  Fact Sheet for Patients:  EntrepreneurPulse.com.au  Fact Sheet for Healthcare Providers:  IncredibleEmployment.be  This test is no t yet approved or cleared by the [place] FDA and  has been authorized for detection and/or diagnosis of SARS-CoV-2 by FDA under an Emergency Use Authorization (EUA). This EUA will remain  in effect (meaning this test can be used) for the duration of the COVID-19 declaration under Section 564(b)(1) of the Act, 21 U.S.C.section 360bbb-3(b)(1), unless the authorization is terminated  or revoked sooner.       Influenza Takya Vandivier by PCR NEGATIVE NEGATIVE Final   Influenza B by PCR NEGATIVE NEGATIVE Final    Comment: (NOTE) The Xpert Xpress SARS-CoV-2/FLU/RSV plus assay is intended as an aid in the diagnosis of influenza from Nasopharyngeal swab specimens and should not be used as Jaquelinne Glendening sole basis for treatment. Nasal washings and aspirates are unacceptable for Xpert Xpress SARS-CoV-2/FLU/RSV testing.  Fact Sheet for Patients: EntrepreneurPulse.com.au  Fact Sheet for Healthcare Providers:  IncredibleEmployment.be  This test is not yet approved or cleared by the [place] and has been authorized for detection and/or diagnosis of SARS-CoV-2 by FDA under an Emergency Use Authorization (EUA). This EUA will remain in effect (meaning this test can be used) for the duration of the COVID-19 declaration under Section  564(b)(1) of the Act, 21 U.S.C. section 360bbb-3(b)(1), unless the authorization is terminated or revoked.  Performed at [facility], [place] [address] [place], [place] [address]          Radiology Studies: MR THORACIC SPINE WO CONTRAST  Result Date: 09/08/2020 CLINICAL DATA:  Bilateral upper and lower extremity weakness and pain. EXAM: MRI THORACIC AND LUMBAR SPINE WITHOUT CONTRAST TECHNIQUE: Multiplanar and multiecho pulse sequences of the thoracic and lumbar spine were obtained without intravenous contrast. COMPARISON:  CT chest 09/06/2020.  CT abdomen and pelvis 06/21/2017. FINDINGS: MRI THORACIC SPINE FINDINGS Alignment: 0.3 cm anterolisthesis C7 on T1 and trace anterolisthesis T9 on T10. Vertebrae: No fracture, evidence of discitis, or bone lesion. Scattered small Schmorl's nodes are noted. Mild degenerative endplate signal change is seen anteriorly at T9-10 and T10-11. Cord:  Normal signal throughout. Paraspinal and other soft tissues: Left hilar mass is better demonstrated on the patient's recent chest CT. Otherwise negative. Disc levels: T1-2: There is loss of disc space height with Frankey Botting shallow bulge and endplate spur. The central canal is open. Moderate bilateral foraminal narrowing is noted. T2-3: Tiny central protrusion without stenosis. T3-4: Negative. T4-5: Minimal bulge without stenosis. T5-6: Negative. T6-7: Minimal bulge without stenosis. T7-8: Minimal bulge without stenosis. T8-9: Moderate facet arthropathy.  Shallow disc bulge.  No stenosis. T9-10: Moderate facet arthritis moderate to moderately severe facet degenerative change, worse on the left. The disc is uncovered with Alysha Doolan shallow bulge and endplate spur and there is some ligamentum flavum thickening. There is mild central canal stenosis. Mild to moderate foraminal narrowing is worse on the left. T10-11: Left worse than right facet degenerative disease. Minimal disc bulge. Moderate left foraminal narrowing. The central canal  and right foramen are open. T11-12: Facet degenerative change.  Otherwise negative. T12-L1: Facet degenerative disease and Latoiya Maradiaga minimal disc bulge. No stenosis. MRI LUMBAR SPINE FINDINGS Segmentation:  Standard. Alignment: There is convex left scoliosis with the apex at approximately L2-3 Vertebrae:  No fracture, evidence of discitis, or bone lesion. Conus medullaris and cauda equina: Conus extends to the L2 level. Conus and cauda equina appear normal. Paraspinal and other soft tissues: Negative. Disc levels: L1-2: There is loss of disc space height. Disc bulge and endplate spur are more prominent to the right. There is also some ligamentum flavum thickening which is worse on the right. Narrowing in the right lateral recess could impact the descending right L2 root. Foramina are open. L2-3: Central protrusion and endplate spur. There is some narrowing in the right subarticular recess and moderate right foraminal narrowing. The left foramen is open. Facet degenerative change is more notable on the right. L3-4: Bilateral facet arthropathy. Broad-based disc bulge and endplate spur. Vacuum disc phenomenon. Moderate central canal stenosis and left worse than right subarticular recess narrowing. Moderately severe bilateral foraminal narrowing. L4-5: There is autologous fusion across the disc interspace and the facets are ankylosed. No bulge or protrusion. No stenosis. L5-S1: Autologous fusion across the disc interspace and ankylosis of the left facets. No stenosis. IMPRESSION: MR THORACIC SPINE IMPRESSION Known left hilar mass is better demonstrated on recent chest CT. Mild central canal stenosis and mild to moderate  foraminal narrowing at T9-10. Facet degenerative change at this level is worse on the left. Moderate left foraminal narrowing T10-11. MR LUMBAR SPINE IMPRESSION Convex left scoliosis. Degenerative disc disease at L1-2 results in narrowing in the right subarticular recess which could impact the descending right  L2 root. Moderate right foraminal narrowing and narrowing in the right subarticular recess at L2-3 could impact the descending right L3 and exiting right L2 roots. Moderate central canal stenosis and left worse than right subarticular recess narrowing at L3-4. There is also moderately severe bilateral foraminal narrowing at this level. Electronically Signed   By: Inge Rise M.D.   On: 09/08/2020 11:23   MR LUMBAR SPINE WO CONTRAST  Result Date: 09/08/2020 CLINICAL DATA:  Bilateral upper and lower extremity weakness and pain. EXAM: MRI THORACIC AND LUMBAR SPINE WITHOUT CONTRAST TECHNIQUE: Multiplanar and multiecho pulse sequences of the thoracic and lumbar spine were obtained without intravenous contrast. COMPARISON:  CT chest 09/06/2020.  CT abdomen and pelvis 06/21/2017. FINDINGS: MRI THORACIC SPINE FINDINGS Alignment: 0.3 cm anterolisthesis C7 on T1 and trace anterolisthesis T9 on T10. Vertebrae: No fracture, evidence of discitis, or bone lesion. Scattered small Schmorl's nodes are noted. Mild degenerative endplate signal change is seen anteriorly at T9-10 and T10-11. Cord:  Normal signal throughout. Paraspinal and other soft tissues: Left hilar mass is better demonstrated on the patient's recent chest CT. Otherwise negative. Disc levels: T1-2: There is loss of disc space height with Naliya Gish shallow bulge and endplate spur. The central canal is open. Moderate bilateral foraminal narrowing is noted. T2-3: Tiny central protrusion without stenosis. T3-4: Negative. T4-5: Minimal bulge without stenosis. T5-6: Negative. T6-7: Minimal bulge without stenosis. T7-8: Minimal bulge without stenosis. T8-9: Moderate facet arthropathy.  Shallow disc bulge.  No stenosis. T9-10: Moderate facet arthritis moderate to moderately severe facet degenerative change, worse on the left. The disc is uncovered with Jaliah Foody shallow bulge and endplate spur and there is some ligamentum flavum thickening. There is mild central canal stenosis.  Mild to moderate foraminal narrowing is worse on the left. T10-11: Left worse than right facet degenerative disease. Minimal disc bulge. Moderate left foraminal narrowing. The central canal and right foramen are open. T11-12: Facet degenerative change.  Otherwise negative. T12-L1: Facet degenerative disease and Disa Riedlinger minimal disc bulge. No stenosis. MRI LUMBAR SPINE FINDINGS Segmentation:  Standard. Alignment: There is convex left scoliosis with the apex at approximately L2-3 Vertebrae:  No fracture, evidence of discitis, or bone lesion. Conus medullaris and cauda equina: Conus extends to the L2 level. Conus and cauda equina appear normal. Paraspinal and other soft tissues: Negative. Disc levels: L1-2: There is loss of disc space height. Disc bulge and endplate spur are more prominent to the right. There is also some ligamentum flavum thickening which is worse on the right. Narrowing in the right lateral recess could impact the descending right L2 root. Foramina are open. L2-3: Central protrusion and endplate spur. There is some narrowing in the right subarticular recess and moderate right foraminal narrowing. The left foramen is open. Facet degenerative change is more notable on the right. L3-4: Bilateral facet arthropathy. Broad-based disc bulge and endplate spur. Vacuum disc phenomenon. Moderate central canal stenosis and left worse than right subarticular recess narrowing. Moderately severe bilateral foraminal narrowing. L4-5: There is autologous fusion across the disc interspace and the facets are ankylosed. No bulge or protrusion. No stenosis. L5-S1: Autologous fusion across the disc interspace and ankylosis of the left facets. No stenosis. IMPRESSION: MR THORACIC SPINE IMPRESSION  Known left hilar mass is better demonstrated on recent chest CT. Mild central canal stenosis and mild to moderate foraminal narrowing at T9-10. Facet degenerative change at this level is worse on the left. Moderate left foraminal  narrowing T10-11. MR LUMBAR SPINE IMPRESSION Convex left scoliosis. Degenerative disc disease at L1-2 results in narrowing in the right subarticular recess which could impact the descending right L2 root. Moderate right foraminal narrowing and narrowing in the right subarticular recess at L2-3 could impact the descending right L3 and exiting right L2 roots. Moderate central canal stenosis and left worse than right subarticular recess narrowing at L3-4. There is also moderately severe bilateral foraminal narrowing at this level. Electronically Signed   By: Inge Rise M.D.   On: 09/08/2020 11:23        Scheduled Meds: . carvedilol  3.125 mg Oral BID WC  . citalopram  20 mg Oral QHS  . clonazepam  0.25 mg Oral QHS  . donepezil  10 mg Oral QHS  . insulin aspart  0-15 Units Subcutaneous TID WC  . latanoprost  1 drop Both Eyes QHS  . levothyroxine  50 mcg Oral QAC breakfast  . pantoprazole  40 mg Oral Daily  . sodium chloride flush  3 mL Intravenous Q12H  . zolpidem  5 mg Oral Daily   Continuous Infusions:   LOS: 10 days    Time spent: over 30 min    Fayrene Helper, MD [facility]   To contact the attending provider between 7A-7P or the covering provider during after hours 7P-7A, please log into the web site [email or web address] and access using universal [place] password for that web site. If you do not have the password, please call the hospital operator.  09/10/2020, 9:57 AM

## 2020-09-10 NOTE — Progress Notes (Signed)
Left upper ext venous US completed.    Please see CV Proc for preliminary results.   Vonzell Schlatter, RVT

## 2020-09-10 NOTE — Plan of Care (Signed)
  Problem: Coping: Goal: Level of anxiety will decrease Outcome: Progressing   Problem: Pain Managment: Goal: General experience of comfort will improve Outcome: Progressing   Problem: Safety: Goal: Ability to remain free from injury will improve Outcome: Progressing   Problem: Skin Integrity: Goal: Risk for impaired skin integrity will decrease Outcome: Progressing   Problem: Skin Integrity: Goal: Risk for impaired skin integrity will decrease Outcome: Progressing

## 2020-09-10 NOTE — Progress Notes (Signed)
[facility]  EBUS yesterday Cytology reports not back yet Will see again after path back  Roselie Awkward, MD [place] [facility] Pager: [phone] Cell: [phone] If no response, call [phone]

## 2020-09-11 DIAGNOSIS — R531 Weakness: Secondary | ICD-10-CM | POA: Diagnosis not present

## 2020-09-11 LAB — COMPREHENSIVE METABOLIC PANEL
ALT: 52 U/L — ABNORMAL HIGH (ref 0–44)
AST: 75 U/L — ABNORMAL HIGH (ref 15–41)
Albumin: 1.8 g/dL — ABNORMAL LOW (ref 3.5–5.0)
Alkaline Phosphatase: 68 U/L (ref 38–126)
Anion gap: 11 (ref 5–15)
BUN: 27 mg/dL — ABNORMAL HIGH (ref 8–23)
CO2: 24 mmol/L (ref 22–32)
Calcium: 8.5 mg/dL — ABNORMAL LOW (ref 8.9–10.3)
Chloride: 99 mmol/L (ref 98–111)
Creatinine, Ser: 0.77 mg/dL (ref 0.44–1.00)
GFR, Estimated: >60 mL/min (ref >60)
Glucose, Bld: 120 mg/dL — ABNORMAL HIGH (ref 70–99)
Potassium: 4.6 mmol/L (ref 3.5–5.1)
Sodium: 134 mmol/L — ABNORMAL LOW (ref 135–145)
Total Bilirubin: 0.6 mg/dL (ref 0.3–1.2)
Total Protein: 6.3 g/dL — ABNORMAL LOW (ref 6.5–8.1)

## 2020-09-11 LAB — CBC WITH DIFFERENTIAL/PLATELET
Abs Immature Granulocytes: 0.04 10*3/uL (ref 0.00–0.07)
Basophils Absolute: 0 10*3/uL (ref 0.0–0.1)
Basophils Relative: 0 %
Eosinophils Absolute: 0 10*3/uL (ref 0.0–0.5)
Eosinophils Relative: 0 %
HCT: 31.6 % — ABNORMAL LOW (ref 36.0–46.0)
Hemoglobin: 10.5 g/dL — ABNORMAL LOW (ref 12.0–15.0)
Immature Granulocytes: 1 %
Lymphocytes Relative: 22 %
Lymphs Abs: 2 10*3/uL (ref 0.7–4.0)
MCH: 30.9 pg (ref 26.0–34.0)
MCHC: 33.2 g/dL (ref 30.0–36.0)
MCV: 92.9 fL (ref 80.0–100.0)
Monocytes Absolute: 0.5 10*3/uL (ref 0.1–1.0)
Monocytes Relative: 5 %
Neutro Abs: 6.4 10*3/uL (ref 1.7–7.7)
Neutrophils Relative %: 72 %
Platelets: 336 10*3/uL (ref 150–400)
RBC: 3.4 MIL/uL — ABNORMAL LOW (ref 3.87–5.11)
RDW: 15 % (ref 11.5–15.5)
WBC: 8.8 10*3/uL (ref 4.0–10.5)
nRBC: 0 % (ref 0.0–0.2)

## 2020-09-11 LAB — RESP PANEL BY RT-PCR (FLU A&B, COVID) ARPGX2
Influenza A by PCR: NEGATIVE
Influenza B by PCR: NEGATIVE
SARS Coronavirus 2 by RT PCR: NEGATIVE

## 2020-09-11 LAB — GLUCOSE, CAPILLARY
Glucose-Capillary: 106 mg/dL — ABNORMAL HIGH (ref 70–99)
Glucose-Capillary: 124 mg/dL — ABNORMAL HIGH (ref 70–99)
Glucose-Capillary: 161 mg/dL — ABNORMAL HIGH (ref 70–99)
Glucose-Capillary: 137 mg/dL — ABNORMAL HIGH (ref 70–99)

## 2020-09-11 LAB — MAGNESIUM: Magnesium: 2 mg/dL (ref 1.7–2.4)

## 2020-09-11 LAB — PHOSPHORUS: Phosphorus: 3.3 mg/dL (ref 2.5–4.6)

## 2020-09-11 MED ORDER — RIVAROXABAN 20 MG PO TABS
20.0000 mg | ORAL_TABLET | Freq: Every day | ORAL | Status: DC
  Administered 2020-09-11 – 2020-09-12 (×2): 20 mg via ORAL
  Filled 2020-09-11 (×2): qty 1

## 2020-09-11 MED ORDER — RIVAROXABAN 20 MG PO TABS: 20.0000 mg | ORAL_TABLET | Freq: Every day | ORAL | 0 refills | Status: AC

## 2020-09-11 MED ORDER — COVID-19 MRNA VACCINE (PFIZER) 30 MCG/0.3ML IM SUSP
0.3000 mL | Freq: Once | INTRAMUSCULAR | Status: AC
  Administered 2020-09-11: 0.3 mL via INTRAMUSCULAR
  Filled 2020-09-11: qty 0.3

## 2020-09-11 MED ORDER — CLONAZEPAM 0.25 MG PO TBDP: 0.2500 mg | ORAL_TABLET | Freq: Every day | ORAL | 0 refills | Status: DC

## 2020-09-11 MED ORDER — CITALOPRAM HYDROBROMIDE 40 MG PO TABS: 20.0000 mg | ORAL_TABLET | Freq: Every day | ORAL | 0 refills | Status: AC

## 2020-09-11 NOTE — Discharge Summary (Addendum)
Physician Discharge Summary  SHUNTAY EVERETTS UXN:[id number] DOB: 02/03/1933 DOA: 08/30/2020  PCP: [name], MD  Admit date: 08/30/2020 Discharge date: 09/12/2020  Time spent: 40 minutes  Recommendations for Outpatient Follow-up:  1. Follow outpatient CBC/CMP 2. Follow pending cytology - needs follow up with pulmonology/oncology outpatient  3. Follow outpatient for left hilar mass with pulmonology/oncology 4. Encephalopathy improved - follow with neurology outpatient for TIA, encephalopathy, and catatonia - last dose of ambien tonight, 1/4.  Avoid antipsychotics.   5. Follow clonazepam dose, taper as able  6. Follow with spine surgery for stenosis 7. Needs repeat x ray to right wrist around 09/17/2020 8. Started on xarelto for atrial fibrillation - follow outpatient for tolerance  Please review pending cytology prior to discharge. Discharge Diagnoses:  Active Problems:   History of TIA (transient ischemic attack)   PAF (paroxysmal atrial fibrillation) (HCC)   Anxiety and depression   Diabetes ([place])   Hypothyroidism   Hand pain, right   Discharge Condition: stable  Diet recommendation: carb modified  Filed Weights   08/30/20 1748  Weight: 88.5 kg    History of present illness:  Ashlee Greer 87 y.o.femalewith medical history significant ofA. fib not on anticoagulation, anxiety/depression, arthritis, diabetes, glaucoma, hypertension, hypothyroidism who presented with recurrent loss of consciousness secondary to orthostatic hypotension.  Patient was recently discharged from [facility] after treatment for TIA, UTI, pneumonia. She also had some fatigue and orthostasis while she was there. She was discharged on Bactrim for UTI and Levaquin for her pneumonia. She was recommended to go to an SNF at discharge but family is an Therapist, sports and stated they would care for patient at home.  However, her BP remained low prior to discharge which concerned family.  Due to  ongoing ortho hypoTN on returning home, family brought patient to the hospital.   Workup in the ER was notable for temp 101.4, tachypnea, tachycardia. CXR showed left basilar infiltrate vs atelectasis.  Lactic was elevated and she was started on IVF and cefepime. Covid and flu swab negative.  She continued to improve with antibiotics. She was evaluated by physical therapy and recommended for SNF rehab at discharge. This was discussed with her family who was also in agreement.  She was admitted with sepsis for pneumonia.  She was also encephalopathic and had generalized weakness.  She had MRI brain and EEG without acute abnormality.  Neurology saw her and obtained MRI T/L spine.  Her symptoms were thought to be due to mild to moderate catatonia.  She received 4 doses of ambien per neurology and seems to have improved, but still not quite back to baseline (final dose tonight after d/c).  She was seen by pulm for Amontae Ng left hilar mass which was biopsied.  At this time, pending placement at SNF.  Plan is for d/c when bed available.  See below for additional details  Hospital Course:  Severe sepsis 2/2 Community Acquired Pneumonia (HCC)-resolved as of 09/04/2020 - now clinically improved on IV abx; okay to change toAzithro (continue flagyl)and will complete course (d/c cefepime) - follow up cultures: negative to date - completed abx  3.8 x 3.5 cm L hilar Mass      - she's completed course of abx       - s/p flexible bronch and EBUS bronchoscopy 1/1      - follow FNA results - cytology pending on 10/14/4268  Acute Metabolic Encephalopathy       - she was independent ~2  weeks ago at home and has daily decline        - she's lethargic today when I see her, she recently received the Lorrin Mais       - MRI at 12/16 at [facility] was negative for acute events       - head CT today without acute findings       - will get MRI brain (no acute intracranial abnormality, moderate chronic small vessel ischemic  changes and generalized volume loss) and EEG (mild diffuse encephalopathy, nonspecific)       - TSH wnl, follow ammonia (wnl), B12 (wnl), RPR (nonreactive)       - appreciate neurology, concern for 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 If related to general decline, encephalopathy, but she has marked LE weakness, unable to lift R heel off bed and just able to clear the L leg - with hx independence 2 weeks ago, will discuss with neurology regarding need for additional w/u        - appreciate neurology recs ->MRI with mild central canal stenosis and mild to oderate foraminal narrowing at T9-10, moderate L foraminal narrowing T10-11.  Convex L scoliosis.  Degenerative disc disease at L1-2 results in narrowing in R subarticular recess (could impact descending R L2 root).  Moderate R foralminal narrowing and narrowing in R subarticular recess at L2-3 could impact descending R L3 and exiting R L2 roots.  Moderate central canal stenosis and L worse than R subarticular recess narrowing at L3-4.  Bilateral foraminal narrowing at this level.          - will follow with neurology -> note lateral recess narrowing at 3 of lumbar levels may contribute to asymmetry seen on motor exam of lower extremities -> thought catatonia may have unmasked prreviously compensated subclinical RLE motor deficit  Orthostatic hypotension-resolved as of 09/02/2020 -Also possibly multifactorial in setting of infection as well as polypharmacy. Her medications reviewed and she is on sedating medications including clonazepam as well as anticholinergic mechanisms with nortriptyline which will be held;Previous MD has also counseled her bedside that her clonazepam dose needs to be reduced at discharge and ideally would benefit from weaning off of it altogether (continue  to taper given her AMS) -resume celexa at lower dose, 20 mg daily on 12/25 -Continue monitoring blood pressure which is improving -Repeat orthostatic blood pressure: negative on 12/24  History of Fall       - occurred prior to [place] hospitalization       - ? If related to R wrist pain (see below).  She has neck pain, will get cervical imaging (no fracture or subluxation)      - she's complaining of pain limiting lower extremity strength today -> bilateral hip and R knee film without acute finding (remote proximal fibula fx, tricompartment OA and chondrocalcinosis)  History of TIA (transient ischemic attack) - see afib - never received records from [facility] and depression -See orthostatic hypotension -Clonazepam dose significantly reduced and holding nortriptyline -Celexa on hold but will try to resume soon  PAF (paroxysmal atrial fibrillation) ([place]) -Discussed bedside with patient;Eliquis made her confused in the past. Denies any significant bleeding events. She is open to alternative anticoagulation -Discussed that we would trial Xarelto(started 12/27) -Continue Coreg  Acute lower UTI-resolved as  of 09/04/2020 -She does endorse some symptoms. 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 No seizures or epileptiform discharges were seen throughout the recording.  Procedure: Flexible bronchoscopy and EBUS  Bronchoscopy  Consultations:  PCCM  neurology  Discharge Exam: Vitals:   09/11/20 1953 09/12/20 0519  BP: (!) 151/88 (!) 145/82  Pulse: 83 88  Resp: 20 17  Temp: 98.3 F (36.8 C) 98.3 F (36.8 C)  SpO2: 96% 94%   Perseus Westall&Ox3 No new complaints Discussed with daughter over phone  General: No acute distress. Cardiovascular: Heart sounds show Sarea Fyfe regular rate, and rhythm.  Lungs: Clear to auscultation bilaterally Abdomen: Soft, nontender, nondistended  Neurological: Alert and oriented 3. Moves all extremities 4. Bilatearl LE weakness, able to lift left leg off bed, barely clears right heel. Cranial nerves II through XII grossly intact. Skin: Warm and dry. No rashes or lesions. Extremities: No clubbing or cyanosis. No edema.   Discharge Instructions   Discharge Instructions    Ambulatory referral to Neurology   Complete by: As directed    An appointment is requested in approximately: 4 weeks   Ambulatory referral to Spine Surgery   Complete by: As directed    Call MD for:  difficulty breathing, headache or visual disturbances   Complete by: As directed    Call MD for:  extreme fatigue   Complete by: As directed    Call MD for:  hives   Complete by: As directed    Call MD for:  persistant dizziness or light-headedness   Complete by: As directed    Call MD for:  persistant nausea and vomiting   Complete by: As directed    Call MD for:  redness, tenderness, or signs of infection (pain, swelling, redness, odor or green/yellow discharge around incision site)   Complete by: As directed    Call MD for:  severe uncontrolled pain   Complete by: As directed    Call MD for:  temperature >100.4   Complete by: As directed    Diet - low sodium heart healthy   Complete by: As directed    Diet - low sodium heart healthy   Complete by: As directed    Discharge instructions   Complete by: As directed    You were seen for sepsis from community acquired pneumonia.  You were found to  have Bryn Saline left hilar mass (lung mass) which was sampled by the lung doctors.  The results are currently pending.  Your mental status has improved, but your weakness is persistent.  You'll need to follow up with neurology as an outpatient.  Your should also follow up with spine as an outpatient.  We've stopped your nortriptyline and reduced your clonazepam dose.  Please follow outpatient for further management of these meds.  You have one more dose of ambien for the catatonia.  Avoid antipsychotics.  Continue your wrist splint.  You'll need Melissaann Dizdarevic repeat x ray around 09/16/20.  You were started on xarelto for your atrial fibrillation.  Follow with your  outpatient cardiologist for this.  Return for new, recurrent, or worsening symptoms.  Please ask your PCP to request records from this hospitalization so they know what was done and what the next steps will be.   Increase activity slowly   Complete by: As directed    Increase activity slowly   Complete by: As directed      Allergies as of 09/12/2020      Reactions   Nisoldipine Shortness Of Breath, Swelling, Other (See Comments)   Possibly made the body swell   Penicillin G Shortness Of Breath, Swelling, Other (See Comments)   Body swells   Ace Inhibitors Other (See Comments)   HYPERKALEMIA   Eliquis [apixaban] Other (See Comments)   Altered mental status       Medication List    STOP taking these medications   aspirin 325 MG tablet   clonazePAM 2 MG tablet Commonly known as: KLONOPIN Replaced by: clonazePAM 0.25 MG disintegrating tablet   levofloxacin 500 MG tablet Commonly known as: LEVAQUIN   nortriptyline 10 MG capsule Commonly known as: PAMELOR   sulfamethoxazole-trimethoprim 800-160 MG tablet Commonly known as: BACTRIM DS     TAKE these medications   carvedilol 3.125 MG tablet Commonly known as: COREG Take 3.125 mg by mouth 2 (two) times daily with Rosan Calbert meal.   citalopram 40 MG tablet Commonly known as: CELEXA Take 0.5  tablets (20 mg total) by mouth daily. What changed:   how much to take  when to take this   clonazePAM 0.25 MG disintegrating tablet Commonly known as: KLONOPIN Take 1 tablet (0.25 mg total) by mouth at bedtime for 5 days. Replaces: clonazePAM 2 MG tablet   donepezil 10 MG tablet Commonly known as: ARICEPT Take 10 mg by mouth at bedtime.   latanoprost 0.005 % ophthalmic solution Commonly known as: XALATAN Place 1 drop into both eyes at bedtime.   levothyroxine 50 MCG tablet Commonly known as: SYNTHROID Take 50 mcg by mouth daily before breakfast.   metFORMIN 1000 MG tablet Commonly known as: GLUCOPHAGE Take 1,000 mg by mouth 2 (two) times daily with Roczen Waymire meal.   pantoprazole 40 MG tablet Commonly known as: PROTONIX Take 40 mg by mouth daily.   psyllium 58.6 % packet Commonly known as: METAMUCIL Take 1 packet by mouth at bedtime.   rivaroxaban 20 MG Tabs tablet Commonly known as: XARELTO Take 1 tablet (20 mg total) by mouth daily with supper.   zolpidem 5 MG tablet Commonly known as: AMBIEN Take 1 tablet (5 mg total) by mouth at bedtime for 1 day.      Allergies  Allergen Reactions  . Nisoldipine Shortness Of Breath, Swelling and Other (See Comments)    Possibly made the body swell  . Penicillin G Shortness Of Breath, Swelling and Other (See Comments)    Body swells  . Ace Inhibitors Other (See Comments)    HYPERKALEMIA  . Eliquis [Apixaban] Other (See Comments)    Altered mental status     Follow-up Information    Candee Furbish, MD Follow up.   Specialty: Pulmonary Disease Why: If you don't hear anything about the biopsy, call Dr. Micah Flesher information: [place] [phone]        [name], MD Follow up.   Specialty: Family Medicine Contact information: [address] [place] [address] [phone]                The results of significant  diagnostics from this hospitalization (including  imaging, microbiology, ancillary and laboratory) are listed below for reference.    Significant Diagnostic Studies: EEG  Result Date: 09/07/2020 Lora Havens, MD     09/07/2020  7:42 AM Patient Name: RENATE DANH MRN: [id number] Epilepsy Attending: Lora Havens Referring Physician/Provider: Dr Fayrene Helper Date: 09/06/2020 Duration: 24.03 mins Patient history: 87yo F with ams. EEG to evaluate for seizure Level of alertness: Awake AEDs during EEG study: Clonazapem Technical aspects: This EEG study was done with scalp electrodes positioned according to the 10-20 International system of electrode placement. Electrical activity was acquired at Mirayah Wren sampling rate of _0  and reviewed with Jomes Giraldo high frequency filter of _1  and Sofiah Lyne low frequency filter of _2 . EEG data were recorded continuously and digitally stored. Description: The posterior dominant rhythm consists of 7 Hz activity of moderate voltage (25-35 uV) seen predominantly in posterior head regions, symmetric and reactive to eye opening and eye closing. EEG showed continuous generalized 6-7 Hz theta slowing.Hyperventilation and photic stimulation were not performed.   ABNORMALITY -Continuous slow, generalized - Background slow IMPRESSION: This study is suggestive of mild diffuse encephalopathy, nonspecific etiology. No seizures or epileptiform discharges were seen throughout the recording. Priyanka Barbra Sarks   DG Wrist Complete Right  Result Date: 09/02/2020 CLINICAL DATA:  Medial right wrist pain medial wrist pain, initial encounter EXAM: RIGHT WRIST - COMPLETE 3+ VIEW COMPARISON:  None. FINDINGS: Radiocarpal degenerative changes are identified. No acute fracture is seen. Degenerative changes of the first [facility] joint are seen as well. Mild generalized soft tissue swelling is seen. No acute fracture is noted. 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 IMPRESSION: Degenerative changes and prior traumatic changes without acute  abnormality Electronically Signed   By: Inez Catalina M.D.   On: 09/02/2020 16:32   DG Knee 1-2 Views Right  Result Date: 09/07/2020 CLINICAL DATA:  Fall. EXAM: RIGHT KNEE - 1-2 VIEW COMPARISON:  None. FINDINGS: Previous hardware fixation of the lateral tibial plateau. Remote fracture deformity of the proximal fibula noted. No joint effusion identified. Loose body within the suprapatellar joint space measures 8 mm. Moderate tricompartment osteoarthritis is noted with joint space narrowing and marginal spur formation. Chondrocalcinosis identified. IMPRESSION: 1. No acute findings. 2. Remote fracture deformity of the proximal fibula. 3. Moderate tricompartment osteoarthritis and chondrocalcinosis. Electronically Signed   By: Kerby Moors M.D.   On: 09/07/2020 12:08   CT HEAD WO CONTRAST  Result Date: 09/06/2020 CLINICAL DATA:  [place] EXAM: CT HEAD WITHOUT CONTRAST TECHNIQUE: Contiguous axial images were obtained from the base of the skull through the vertex without intravenous contrast. COMPARISON:  08/24/2020 FINDINGS: Brain: No evidence of acute infarction, hemorrhage, extra-axial collection, ventriculomegaly, or mass effect. Generalized cerebral atrophy. Periventricular white matter low attenuation likely secondary to microangiopathy. Vascular: Cerebrovascular atherosclerotic calcifications are noted. Skull: Negative for fracture or focal lesion. Sinuses/Orbits: Visualized portions of the orbits are unremarkable. Visualized portions of the paranasal sinuses are unremarkable. Visualized portions of the mastoid air cells are unremarkable. Other: None. IMPRESSION: 1. No acute intracranial pathology. 2. Chronic microvascular disease and cerebral atrophy. Electronically Signed   By: Kathreen Devoid   On: 09/06/2020 13:50   CT CHEST W CONTRAST  Result Date: 09/06/2020 CLINICAL DATA:  Respiratory failure. EXAM: CT CHEST WITH CONTRAST TECHNIQUE: Multidetector CT imaging of the chest was performed during  intravenous contrast administration. CONTRAST:  11m OMNIPAQUE IOHEXOL 300 MG/ML  SOLN COMPARISON:  Radiograph of same day. FINDINGS: Cardiovascular:  Atherosclerosis of thoracic aorta is noted without aneurysm or dissection. Mild cardiomegaly is noted. No pericardial effusion is noted. Mediastinum/Nodes: Thyroid gland and esophagus are unremarkable. 3.8 x 3.5 cm left hilar mass is noted concerning for malignancy or metastatic disease. Lungs/Pleura: No pneumothorax is noted. Minimal right posterior basilar subsegmental atelectasis is noted. Small left pleural effusion is noted with adjacent left basilar subsegmental atelectasis or infiltrate. Upper Abdomen: No acute abnormality. Musculoskeletal: No chest wall abnormality. No acute or significant osseous findings. IMPRESSION: 1. 3.8 x 3.5 cm left hilar mass is noted concerning for malignancy or metastatic disease. PET scan is recommended for further evaluation. 2. Small left pleural effusion is noted with adjacent left basilar subsegmental atelectasis or infiltrate. 3. Aortic atherosclerosis. Aortic Atherosclerosis (ICD10-I70.0). Electronically Signed   By: Marijo Conception M.D.   On: 09/06/2020 13:49   CT CERVICAL SPINE WO CONTRAST  Result Date: 09/07/2020 CLINICAL DATA:  [place]. EXAM: CT CERVICAL SPINE WITHOUT CONTRAST TECHNIQUE: Multidetector CT imaging of the cervical spine was performed without intravenous contrast. Multiplanar CT image reconstructions were also generated. COMPARISON:  Cervical spine MRI dated 09/07/2020 FINDINGS: Alignment: Mild anterolisthesis at the C6-7 and C7-T1 levels. This is unchanged at the C7-T1 level since 11/19/2005. Skull base and vertebrae: No acute fracture. No primary bone lesion or focal pathologic process. Soft tissues and spinal canal: No prevertebral fluid or swelling. No visible canal hematoma. Disc levels: Multilevel degenerative changes, including facet degenerative changes throughout the cervical spine. Upper chest:  Clear lung apices. Other: Right carotid artery atheromatous calcifications and minimal left carotid artery atheromatous calcifications. IMPRESSION: 1. No fracture or traumatic subluxation. 2. Multilevel degenerative changes, as described above. 3. Right carotid artery atheromatous calcifications and minimal left carotid artery atheromatous calcifications. Electronically Signed   By: Claudie Revering M.D.   On: 09/07/2020 12:19   MR BRAIN WO CONTRAST  Result Date: 09/06/2020 CLINICAL DATA:  Encephalopathy EXAM: MRI HEAD WITHOUT CONTRAST TECHNIQUE: Multiplanar, multiecho pulse sequences of the brain and surrounding structures were obtained without intravenous contrast. COMPARISON:  08/24/2020 FINDINGS: Brain: No acute infarct, mass effect or extra-axial collection. No acute or chronic hemorrhage. Hyperintense T2-weighted signal is moderately widespread throughout the white matter. Generalized volume loss without Adelyn Roscher clear lobar predilection. The midline structures are normal. Vascular: Major flow voids are preserved. Skull and upper cervical spine: Normal calvarium and skull base. Visualized upper cervical spine and soft tissues are normal. Sinuses/Orbits:No paranasal sinus fluid levels or advanced mucosal thickening. No mastoid or middle ear effusion. Normal orbits. IMPRESSION: 1. No acute intracranial abnormality. 2. Moderate chronic small vessel ischemic changes and generalized volume loss. Electronically Signed   By: Ulyses Jarred M.D.   On: 09/06/2020 20:26   MR THORACIC SPINE WO CONTRAST  Result Date: 09/08/2020 CLINICAL DATA:  Bilateral upper and lower extremity weakness and pain. EXAM: MRI THORACIC AND LUMBAR SPINE WITHOUT CONTRAST TECHNIQUE: Multiplanar and multiecho pulse sequences of the thoracic and lumbar spine were obtained without intravenous contrast. COMPARISON:  CT chest 09/06/2020.  CT abdomen and pelvis 06/21/2017. FINDINGS: MRI THORACIC SPINE FINDINGS Alignment: 0.3 cm anterolisthesis C7 on T1  and trace anterolisthesis T9 on T10. Vertebrae: No fracture, evidence of discitis, or bone lesion. Scattered small Schmorl's nodes are noted. Mild degenerative endplate signal change is seen anteriorly at T9-10 and T10-11. Cord:  Normal signal throughout. Paraspinal and other soft tissues: Left hilar mass is better demonstrated on the patient's recent chest CT. Otherwise negative. Disc levels: T1-2: There is loss of disc space height  with Atzin Buchta shallow bulge and endplate spur. The central canal is open. Moderate bilateral foraminal narrowing is noted. T2-3: Tiny central protrusion without stenosis. T3-4: Negative. T4-5: Minimal bulge without stenosis. T5-6: Negative. T6-7: Minimal bulge without stenosis. T7-8: Minimal bulge without stenosis. T8-9: Moderate facet arthropathy.  Shallow disc bulge.  No stenosis. T9-10: Moderate facet arthritis moderate to moderately severe facet degenerative change, worse on the left. The disc is uncovered with Motty Borin shallow bulge and endplate spur and there is some ligamentum flavum thickening. There is mild central canal stenosis. Mild to moderate foraminal narrowing is worse on the left. T10-11: Left worse than right facet degenerative disease. Minimal disc bulge. Moderate left foraminal narrowing. The central canal and right foramen are open. T11-12: Facet degenerative change.  Otherwise negative. T12-L1: Facet degenerative disease and Socorro Kanitz minimal disc bulge. No stenosis. MRI LUMBAR SPINE FINDINGS Segmentation:  Standard. Alignment: There is convex left scoliosis with the apex at approximately L2-3 Vertebrae:  No fracture, evidence of discitis, or bone lesion. Conus medullaris and cauda equina: Conus extends to the L2 level. Conus and cauda equina appear normal. Paraspinal and other soft tissues: Negative. Disc levels: L1-2: There is loss of disc space height. Disc bulge and endplate spur are more prominent to the right. There is also some ligamentum flavum thickening which is worse on the  right. Narrowing in the right lateral recess could impact the descending right L2 root. Foramina are open. L2-3: Central protrusion and endplate spur. There is some narrowing in the right subarticular recess and moderate right foraminal narrowing. The left foramen is open. Facet degenerative change is more notable on the right. L3-4: Bilateral facet arthropathy. Broad-based disc bulge and endplate spur. Vacuum disc phenomenon. Moderate central canal stenosis and left worse than right subarticular recess narrowing. Moderately severe bilateral foraminal narrowing. L4-5: There is autologous fusion across the disc interspace and the facets are ankylosed. No bulge or protrusion. No stenosis. L5-S1: Autologous fusion across the disc interspace and ankylosis of the left facets. No stenosis. IMPRESSION: MR THORACIC SPINE IMPRESSION Known left hilar mass is better demonstrated on recent chest CT. Mild central canal stenosis and mild to moderate foraminal narrowing at T9-10. Facet degenerative change at this level is worse on the left. Moderate left foraminal narrowing T10-11. MR LUMBAR SPINE IMPRESSION Convex left scoliosis. Degenerative disc disease at L1-2 results in narrowing in the right subarticular recess which could impact the descending right L2 root. Moderate right foraminal narrowing and narrowing in the right subarticular recess at L2-3 could impact the descending right L3 and exiting right L2 roots. Moderate central canal stenosis and left worse than right subarticular recess narrowing at L3-4. There is also moderately severe bilateral foraminal narrowing at this level. Electronically Signed   By: Inge Rise M.D.   On: 09/08/2020 11:23   MR LUMBAR SPINE WO CONTRAST  Result Date: 09/08/2020 CLINICAL DATA:  Bilateral upper and lower extremity weakness and pain. EXAM: MRI THORACIC AND LUMBAR SPINE WITHOUT CONTRAST TECHNIQUE: Multiplanar and multiecho pulse sequences of the thoracic and lumbar spine were  obtained without intravenous contrast. COMPARISON:  CT chest 09/06/2020.  CT abdomen and pelvis 06/21/2017. FINDINGS: MRI THORACIC SPINE FINDINGS Alignment: 0.3 cm anterolisthesis C7 on T1 and trace anterolisthesis T9 on T10. Vertebrae: No fracture, evidence of discitis, or bone lesion. Scattered small Schmorl's nodes are noted. Mild degenerative endplate signal change is seen anteriorly at T9-10 and T10-11. Cord:  Normal signal throughout. Paraspinal and other soft tissues: Left hilar mass is better  demonstrated on the patient's recent chest CT. Otherwise negative. Disc levels: T1-2: There is loss of disc space height with Albeiro Trompeter shallow bulge and endplate spur. The central canal is open. Moderate bilateral foraminal narrowing is noted. T2-3: Tiny central protrusion without stenosis. T3-4: Negative. T4-5: Minimal bulge without stenosis. T5-6: Negative. T6-7: Minimal bulge without stenosis. T7-8: Minimal bulge without stenosis. T8-9: Moderate facet arthropathy.  Shallow disc bulge.  No stenosis. T9-10: Moderate facet arthritis moderate to moderately severe facet degenerative change, worse on the left. The disc is uncovered with Geovanni Rahming shallow bulge and endplate spur and there is some ligamentum flavum thickening. There is mild central canal stenosis. Mild to moderate foraminal narrowing is worse on the left. T10-11: Left worse than right facet degenerative disease. Minimal disc bulge. Moderate left foraminal narrowing. The central canal and right foramen are open. T11-12: Facet degenerative change.  Otherwise negative. T12-L1: Facet degenerative disease and Cniyah Sproull minimal disc bulge. No stenosis. MRI LUMBAR SPINE FINDINGS Segmentation:  Standard. Alignment: There is convex left scoliosis with the apex at approximately L2-3 Vertebrae:  No fracture, evidence of discitis, or bone lesion. Conus medullaris and cauda equina: Conus extends to the L2 level. Conus and cauda equina appear normal. Paraspinal and other soft tissues: Negative.  Disc levels: L1-2: There is loss of disc space height. Disc bulge and endplate spur are more prominent to the right. There is also some ligamentum flavum thickening which is worse on the right. Narrowing in the right lateral recess could impact the descending right L2 root. Foramina are open. L2-3: Central protrusion and endplate spur. There is some narrowing in the right subarticular recess and moderate right foraminal narrowing. The left foramen is open. Facet degenerative change is more notable on the right. L3-4: Bilateral facet arthropathy. Broad-based disc bulge and endplate spur. Vacuum disc phenomenon. Moderate central canal stenosis and left worse than right subarticular recess narrowing. Moderately severe bilateral foraminal narrowing. L4-5: There is autologous fusion across the disc interspace and the facets are ankylosed. No bulge or protrusion. No stenosis. L5-S1: Autologous fusion across the disc interspace and ankylosis of the left facets. No stenosis. IMPRESSION: MR THORACIC SPINE IMPRESSION Known left hilar mass is better demonstrated on recent chest CT. Mild central canal stenosis and mild to moderate foraminal narrowing at T9-10. Facet degenerative change at this level is worse on the left. Moderate left foraminal narrowing T10-11. MR LUMBAR SPINE IMPRESSION Convex left scoliosis. Degenerative disc disease at L1-2 results in narrowing in the right subarticular recess which could impact the descending right L2 root. Moderate right foraminal narrowing and narrowing in the right subarticular recess at L2-3 could impact the descending right L3 and exiting right L2 roots. Moderate central canal stenosis and left worse than right subarticular recess narrowing at L3-4. There is also moderately severe bilateral foraminal narrowing at this level. Electronically Signed   By: Inge Rise M.D.   On: 09/08/2020 11:23   DG CHEST PORT 1 VIEW  Addendum Date: 09/06/2020   ADDENDUM REPORT: 09/06/2020  11:03 ADDENDUM: Findings of mass effect in the LEFT lower lobe upon bronchial structures were discussed with the referring physician as outlined below. Findings are more likely due to worsening of pulmonary parenchymal process. CT of the chest was however suggested to further evaluate this finding given rapid change and to exclude the possibility of acute vascular process related to the aorta given the location. These results were called by telephone at the time of interpretation on 09/06/2020 at 11:03 am to provider  Xaria Judon POWELL JR , who verbally acknowledged these results. Electronically Signed   By: Zetta Bills M.D.   On: 09/06/2020 11:03   Result Date: 09/06/2020 CLINICAL DATA:  Cough, hypotension atrial fibrillation EXAM: PORTABLE CHEST 1 VIEW COMPARISON:  August 30, 2020 FINDINGS: Trachea midline. The LEFT lower lobe bronchus is splayed atop an area measuring approximately 8.4 x 6.5 cm with some peripheral airspace disease noted in the LEFT lower lobe with obscured LEFT hemidiaphragm. Airspace process more confluent than on the prior study, likely associated with small effusion in the LEFT lung base. RIGHT chest is clear. No acute skeletal process. IMPRESSION: Mass effect along the LEFT retrocardiac region up lifting the LEFT mainstem and lower lobe bronchi with some lower lobe bronchial narrowing suggested, associated with airspace disease and effusion. Findings are suspicious first and foremost for developing pulmonary abscess or worsening pneumonia though there is no air-fluid level seen on the current study. Vascular etiology and aneurysm also considered given the appearance though felt less likely. Would suggest contrasted CT of the chest for further evaluation given the rapid change in the appearance of the LEFT lower lobe since the previous study. Amea Mcphail call is out to the referring provider to further discuss findings in the above case. Electronically Signed: By: Zetta Bills M.D. On: 09/06/2020 10:30    DG Chest Port 1 View  Result Date: 08/30/2020 CLINICAL DATA:  Sepsis. EXAM: PORTABLE CHEST 1 VIEW COMPARISON:  08/28/2020 FINDINGS: Cardiomegaly remains stable. Mild infiltrate or atelectasis in the left lung base appears increased since previous study. Linear opacity at the right lung base is stable and may be due to subsegmental atelectasis or scarring. IMPRESSION: Increased mild left basilar infiltrate or atelectasis. Stable right basilar atelectasis versus scarring. Stable cardiomegaly. Electronically Signed   By: Marlaine Hind M.D.   On: 08/30/2020 18:27   DG Hand Complete Right  Result Date: 09/02/2020 CLINICAL DATA:  Medial wrist pain, fall 1 week ago, initial encounter EXAM: RIGHT HAND - COMPLETE 3+ VIEW COMPARISON:  None. FINDINGS: Degenerative changes are noted at the radiocarpal joint the first [facility] joint. Interphalangeal degenerative changes are noted as well. Widening of the scapholunate space is noted better visualized than on the prior wrist films likely related to prior trauma. No acute fracture or dislocation is seen. Mild soft tissue swelling is noted about the wrist. 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 IMPRESSION: Degenerative and posttraumatic changes as described. No acute abnormality noted. Electronically Signed   By: Inez Catalina M.D.   On: 09/02/2020 16:36   DG HIP UNILAT WITH PELVIS 2-3 VIEWS LEFT  Result Date: 09/07/2020 CLINICAL DATA:  Bilateral hip pain after Andrus Sharp fall.  Initial encounter. EXAM: DG HIP (WITH OR WITHOUT PELVIS) 2-3V LEFT; DG HIP (WITH OR WITHOUT PELVIS) 2-3V RIGHT COMPARISON:  Plain films of the hips 08/26/2020. FINDINGS: No acute bony or joint abnormality is seen. 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 IMPRESSION: No acute abnormality. Electronically Signed   By: Inge Rise M.D.   On: 09/07/2020 12:09   DG HIP UNILAT WITH PELVIS 2-3 VIEWS RIGHT  Result Date: 09/07/2020 CLINICAL DATA:  Bilateral hip pain after Luisdaniel Kenton  fall.  Initial encounter. EXAM: DG HIP (WITH OR WITHOUT PELVIS) 2-3V LEFT; DG HIP (WITH OR WITHOUT PELVIS) 2-3V RIGHT COMPARISON:  Plain films of the hips 08/26/2020. FINDINGS: No acute bony or joint abnormality is seen. 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 IMPRESSION: No acute abnormality. Electronically Signed  By: Inge Rise M.D.   On: 09/07/2020 12:09   VAS [place] UPPER EXTREMITY VENOUS DUPLEX  Result Date: 09/11/2020 UPPER VENOUS STUDY  Indications: Swelling, and Pain Limitations: Line. Comparison Study: No previous Performing Technologist: Vonzell Schlatter RVT  Examination Guidelines: Kadisha Goodine complete evaluation includes B-mode imaging, spectral Doppler, color Doppler, and power Doppler as needed of all accessible portions of each vessel. Bilateral testing is considered an integral part of Lynise Porr complete examination. Limited examinations for reoccurring indications may be performed as noted.  Right Findings: +----------+------------+---------+-----------+----------+-------+ RIGHT     CompressiblePhasicitySpontaneousPropertiesSummary +----------+------------+---------+-----------+----------+-------+ Subclavian               Yes       Yes                      +----------+------------+---------+-----------+----------+-------+  Left Findings: +----------+------------+---------+-----------+----------+-------+ LEFT      CompressiblePhasicitySpontaneousPropertiesSummary +----------+------------+---------+-----------+----------+-------+ IJV           Full       Yes       Yes                      +----------+------------+---------+-----------+----------+-------+ Subclavian    Full                                          +----------+------------+---------+-----------+----------+-------+ Axillary      Full                                          +----------+------------+---------+-----------+----------+-------+ Brachial      Full       Yes       Yes                       +----------+------------+---------+-----------+----------+-------+ Radial        Full                                          +----------+------------+---------+-----------+----------+-------+ Ulnar         Full                                          +----------+------------+---------+-----------+----------+-------+ Cephalic      Full                                          +----------+------------+---------+-----------+----------+-------+ Basilic       Full                                          +----------+------------+---------+-----------+----------+-------+  Summary:  Right: No evidence of thrombosis in the subclavian.  Left: No evidence of deep vein thrombosis in the upper extremity. No evidence of superficial vein thrombosis in the upper extremity.  *See table(s) above for measurements and observations.  Diagnosing physician: Ruta Hinds MD Electronically signed by Ruta Hinds MD on 09/11/2020  at 12:15:12 PM.    Final     Microbiology: Recent Results (from the past 240 hour(s))  Resp Panel by RT-PCR (Flu Zyier Dykema&B, Covid) Nasopharyngeal Swab     Status: None   Collection Time: 09/05/20  4:47 PM   Specimen: Nasopharyngeal Swab; Nasopharyngeal(NP) swabs in vial transport medium  Result Value Ref Range Status   SARS Coronavirus 2 by RT PCR NEGATIVE NEGATIVE Final    Comment: (NOTE) SARS-CoV-2 target nucleic acids are NOT DETECTED.  The SARS-CoV-2 RNA is generally detectable in upper respiratory specimens during the acute phase of infection. The lowest concentration of SARS-CoV-2 viral copies this assay can detect is 138 copies/mL. Shaunda Tipping negative result does not preclude SARS-Cov-2 infection and should not be used as the sole basis for treatment or other patient management decisions. Xiomara Sevillano negative result may occur with  improper specimen collection/handling, submission of specimen other than nasopharyngeal swab, presence of viral mutation(s) within the areas  targeted by this assay, and inadequate number of viral copies(<138 copies/mL). Bill Mcvey negative result must be combined with clinical observations, patient history, and epidemiological information. The expected result is Negative.  Fact Sheet for Patients:  EntrepreneurPulse.com.au  Fact Sheet for Healthcare Providers:  IncredibleEmployment.be  This test is no t yet approved or cleared by the [place] FDA and  has been authorized for detection and/or diagnosis of SARS-CoV-2 by FDA under an Emergency Use Authorization (EUA). This EUA will remain  in effect (meaning this test can be used) for the duration of the COVID-19 declaration under Section 564(b)(1) of the Act, 21 U.S.C.section 360bbb-3(b)(1), unless the authorization is terminated  or revoked sooner.       Influenza Moesha Sarchet by PCR NEGATIVE NEGATIVE Final   Influenza B by PCR NEGATIVE NEGATIVE Final    Comment: (NOTE) The Xpert Xpress SARS-CoV-2/FLU/RSV plus assay is intended as an aid in the diagnosis of influenza from Nasopharyngeal swab specimens and should not be used as Lempi Edwin sole basis for treatment. Nasal washings and aspirates are unacceptable for Xpert Xpress SARS-CoV-2/FLU/RSV testing.  Fact Sheet for Patients: EntrepreneurPulse.com.au  Fact Sheet for Healthcare Providers: IncredibleEmployment.be  This test is not yet approved or cleared by the [place] FDA and has been authorized for detection and/or diagnosis of SARS-CoV-2 by FDA under an Emergency Use Authorization (EUA). This EUA will remain in effect (meaning this test can be used) for the duration of the COVID-19 declaration under Section 564(b)(1) of the Act, 21 U.S.C. section 360bbb-3(b)(1), unless the authorization is terminated or revoked.  Performed at [facility], [place] [address], [place], [place] [address]   Resp Panel by RT-PCR (Flu Briante Loveall&B, Covid) Nasopharyngeal Swab      Status: None   Collection Time: 09/11/20  2:07 PM   Specimen: Nasopharyngeal Swab; Nasopharyngeal(NP) swabs in vial transport medium  Result Value Ref Range Status   SARS Coronavirus 2 by RT PCR NEGATIVE NEGATIVE Final    Comment: (NOTE) SARS-CoV-2 target nucleic acids are NOT DETECTED.  The SARS-CoV-2 RNA is generally detectable in upper respiratory specimens during the acute phase of infection. The lowest concentration of SARS-CoV-2 viral copies this assay can detect is 138 copies/mL. Harlene Petralia negative result does not preclude SARS-Cov-2 infection and should not be used as the sole basis for treatment or other patient management decisions. Benn Tarver negative result may occur with  improper specimen collection/handling, submission of specimen other than nasopharyngeal swab, presence of viral mutation(s) within the areas targeted by this assay, and inadequate number of viral copies(<138 copies/mL).  Sashay Felling negative result must be combined with clinical observations, patient history, and epidemiological information. The expected result is Negative.  Fact Sheet for Patients:  EntrepreneurPulse.com.au  Fact Sheet for Healthcare Providers:  IncredibleEmployment.be  This test is no t yet approved or cleared by the [place] FDA and  has been authorized for detection and/or diagnosis of SARS-CoV-2 by FDA under an Emergency Use Authorization (EUA). This EUA will remain  in effect (meaning this test can be used) for the duration of the COVID-19 declaration under Section 564(b)(1) of the Act, 21 U.S.C.section 360bbb-3(b)(1), unless the authorization is terminated  or revoked sooner.       Influenza Zebulun Deman by PCR NEGATIVE NEGATIVE Final   Influenza B by PCR NEGATIVE NEGATIVE Final    Comment: (NOTE) The Xpert Xpress SARS-CoV-2/FLU/RSV plus assay is intended as an aid in the diagnosis of influenza from Nasopharyngeal swab specimens and should not be used as [place] sole basis  for treatment. Nasal washings and aspirates are unacceptable for Xpert Xpress SARS-CoV-2/FLU/RSV testing.  Fact Sheet for Patients: EntrepreneurPulse.com.au  Fact Sheet for Healthcare Providers: IncredibleEmployment.be  This test is not yet approved or cleared by the [place] FDA and has been authorized for detection and/or diagnosis of SARS-CoV-2 by FDA under an Emergency Use Authorization (EUA). This EUA will remain in effect (meaning this test can be used) for the duration of the COVID-19 declaration under Section 564(b)(1) of the Act, 21 U.S.C. section 360bbb-3(b)(1), unless the authorization is terminated or revoked.  Performed at [facility], [place] [address] [place], [place] [address]      Labs: Basic Metabolic Panel: Recent Labs  Lab 09/07/20 0046 09/08/20 0100 09/08/20 0853 09/09/20 0238 09/10/20 0139 09/11/20 0246 09/12/20 0127  NA 131*  --  133* 134* 135 134* 136  K 4.3  --  4.6 4.5 5.1 4.6 5.1  CL 99  --  98 100 100 99 98  CO2 22  --  _0 GLUCOSE 144*  --  151* 157* 190* 120* 125*  BUN 12  --  18 19 25* 27* 22  CREATININE 0.70  --  0.75 0.71 0.72 0.77 0.70  CALCIUM 8.4*  --  8.0* 8.5* 8.6* 8.5* 8.6*  MG 1.9 1.9  --  2.1 2.1 2.0 1.9  PHOS 2.8  --   --  3.2 3.9 3.3 4.3   Liver Function Tests: Recent Labs  Lab 09/08/20 0853 09/09/20 0238 09/10/20 0139 09/11/20 0246 09/12/20 0127  AST 27 68* 47* 75* 58*  ALT 19 37 35 52* 51*  ALKPHOS 65 81 70 68 71  BILITOT 0.4 0.6 0.6 0.6 0.5  PROT 6.1* 6.4* 6.1* 6.3* 6.0*  ALBUMIN 1.8* 1.8* 1.7* 1.8* 2.0*   No results for input(s): LIPASE, AMYLASE in the last 168 hours. Recent Labs  Lab 09/06/20 1802  AMMONIA 33   CBC: Recent Labs  Lab 09/07/20 0046 09/08/20 1437 09/09/20 0238 09/10/20 0139 09/11/20 0246  WBC 11.6* 13.8* 13.1* 10.7* 8.8  NEUTROABS 8.9* 12.0* 10.6* 9.5* 6.4  HGB 11.6* 11.5* 11.3* 10.4* 10.5*  HCT 35.4* 35.8* 35.4* 32.9*  31.6*  MCV 91.2 93.0 92.7 94.0 92.9  PLT 326 339 340 320 336   Cardiac Enzymes: No results for input(s): CKTOTAL, CKMB, CKMBINDEX, TROPONINI in the last 168 hours. BNP: BNP (last 3 results) No results for input(s): BNP in the last 8760 hours.  ProBNP (last 3 results) No results for input(s): PROBNP in the last 8760 hours.  CBG: Recent Labs  Lab 09/11/20 0747 09/11/20 1151 09/11/20 1653 09/11/20 2137 09/12/20 0753  GLUCAP 106* 124* 161* 137* 136*       Signed:  Fayrene Helper MD.  [facility] 09/12/2020, 10:44 AM

## 2020-09-11 NOTE — Progress Notes (Signed)
Physical Therapy Treatment Patient Details Name: Ashlee Greer MRN: [id number] DOB: [birth year] Today's Date: 09/11/2020    History of Present Illness 87 y.o. female with medical history significant of A. fib not on anticoagulation, anxiety/depression, arthritis, diabetes, glaucoma, hypertension, hypothyroidism who presents with recurrent loss of consciousness secondary to orthostatic hypotension. Patient was recently discharged from [facility] after treatment for TIA, UTI, pneumonia.  She also had some fatigue and orthostasis while she was there. She was recommended to go to an SNF at discharge but family is an Therapist, sports and thought they could handle her at home.  Family states that however protested her discharged and got an extra day because of this prior to discharge.  However, after returning home, her blood pressures consistently dropped on standing and has gotten so bad that she had episodes of syncope while working with PT.    PT Comments    Pt was seen for movement to sit on side of bed with strong listing to L and back, likely to return to bed.  Pt and daughter interacted with daughter giving pt good supportive feedback to work on the mobility.  Pt is fearful of falling, and was willing to forgo the work to feel safer.  With encouragement she was convinced to stay on the side of bed to work on midline awareness and trunk control. Follow up with more balance and strrength training as tolerated, and work toward standing as pt is willing. Stedy lift may help with getting up to stand.   Follow Up Recommendations  SNF;Supervision/Assistance - 24 hour     Equipment Recommendations  Other (comment)    Recommendations for Other Services OT consult     Precautions / Restrictions Precautions Precautions: Fall Precaution Comments: pt is posteriorly leaning aggressively Restrictions Weight Bearing Restrictions: No    Mobility  Bed Mobility Overal bed mobility: Needs Assistance Bed  Mobility: Rolling;Supine to Sit;Sit to Supine Rolling: Max assist   Supine to sit: Max assist Sit to supine: Max assist      Transfers                 General transfer comment: pt would not attempt  Ambulation/Gait                 Stairs             Wheelchair Mobility    Modified Rankin (Stroke Patients Only)       Balance     Sitting balance-Leahy Scale: Poor                                      Cognition Arousal/Alertness: Awake/alert Behavior During Therapy: Anxious Overall Cognitive Status: Impaired/Different from baseline Area of Impairment: Safety/judgement;Awareness;Problem solving                 Orientation Level: Situation   Memory: Decreased short-term memory Following Commands: Follows one step commands inconsistently;Follows one step commands with increased time Safety/Judgement: Decreased awareness of deficits;Decreased awareness of safety Awareness: Intellectual Problem Solving: Slow processing;Requires verbal cues;Requires tactile cues General Comments: minimal speech      Exercises      General Comments General comments (skin integrity, edema, etc.): pt was poor sit balance mainly due to her active resistance to sitting up, and was able to assist moving trunk and legs      Pertinent Vitals/Pain Pain Assessment: Faces Faces Pain Scale: Hurts  a little bit Pain Location: generally Pain Intervention(s): Monitored during session;Repositioned    Home Living                      Prior Function            PT Goals (current goals can now be found in the care plan section) Acute Rehab PT Goals Patient Stated Goal: to get stronger PT Goal Formulation: With patient/family Time For Goal Achievement: 09/18/20 Potential to Achieve Goals: Fair Progress towards PT goals: Progressing toward goals    Frequency    Min 2X/week      PT Plan Current plan remains appropriate     Co-evaluation              AM-PAC PT "6 Clicks" Mobility   Outcome Measure  Help needed turning from your back to your side while in a flat bed without using bedrails?: A Lot Help needed moving from lying on your back to sitting on the side of a flat bed without using bedrails?: A Lot Help needed moving to and from a bed to a chair (including a wheelchair)?: Total Help needed standing up from a chair using your arms (e.g., wheelchair or bedside chair)?: Total Help needed to walk in hospital room?: Total Help needed climbing 3-5 steps with a railing? : Total 6 Click Score: 8    End of Session Equipment Utilized During Treatment: Gait belt Activity Tolerance: Patient limited by pain;Treatment limited secondary to medical complications (Comment) Patient left: in bed;with call bell/phone within reach;with bed alarm set Nurse Communication: Mobility status PT Visit Diagnosis: Unsteadiness on feet (R26.81);Other abnormalities of gait and mobility (R26.89);Muscle weakness (generalized) (M62.81);Difficulty in walking, not elsewhere classified (R26.2)     Time: 1007-1219 PT Time Calculation (min) (ACUTE ONLY): 31 min  Charges:  $Therapeutic Activity: 8-22 mins $Neuromuscular Re-education: 8-22 mins                Ramond Dial 09/11/2020, 5:32 PM  Mee Hives, PT MS Acute Rehab Dept. Number: [place] [facility] [place]

## 2020-09-11 NOTE — Progress Notes (Signed)
[facility] PCCM  EBUS 09/10/19 Cytology results not returned yet  P: PCCM will return when path is available  Rodman Pickle, M.D. [facility] Pulmonary/Critical Care Medicine 09/11/2020 9:29 AM

## 2020-09-11 NOTE — Progress Notes (Addendum)
NEUROLOGY CONSULTATION PROGRESS NOTE   Date of service: September 11, 2020 Patient Name: Ashlee Greer MRN:  [id number] DOB:  27-Apr-1933  Brief HPI  Ashlee Greer is a 87 y.o. female with PMH significant for  has a past medical history of A-fib ([place]), Anxiety, Arthritis, Depression, Diabetes mellitus without complication ([place]), Glaucoma, Hypertension, and Thyroid disease. who initially admitted with sepsis but later on decompensated with B/L Upper and lower extremity weakness. Felt to be hypoactive catatonia and improved with Ambien trial.   Interval Hx  Pt continues to c/o weakness in upper and lower extremities. On discussion with hospitalist team and on comparing prior exam to today, weakness in BL lower extremities is improved, althou her right leg continues to be weaker compared to her left. Continues to be slow to respond but awake, alert, oriented x 4.  She endorses that she is scared for her daughter who are diagnosed with covid and in hospital.  Vitals   Vitals:   09/10/20 0448 09/10/20 1515 09/10/20 2109 09/11/20 0541  BP: 130/80 120/69 131/77 (!) 146/78  Pulse: 80 83 79 86  Resp: 16 20 20 20   Temp: 97.6 F (36.4 C) 97.9 F (36.6 C) 97.8 F (36.6 C) (!) 97.5 F (36.4 C)  TempSrc: Oral  Oral Oral  SpO2: 95% 96% 97% 95%  Weight:      Height:         Body mass index is 36.84 kg/m.  Physical Exam   General: Alert, Pleasant, Laying comfortably in bed; Not in acute distress. Slow in responding to questions. Oriented x4. She is able to do simple calculations.  HENT: Normal oropharynx and mucosa. Normal external appearance of ears and nose.  Neck: Supple, no pain or tenderness, No swellings, Trachea central. CV: No JVD. No peripheral edema.  Pulmonary: Symmetric Chest rise. Normal respiratory effort. Abdomen: Soft to touch, non-tender.  Ext: No cyanosis, edema, or deformity  Skin: No rash. Normal palpation of skin.   Neurologic Examination  Mental status/Cognition:  Alert, oriented to self, place, month and year, good attention. Slow in responding questions bur engaged on exam. Able to do calculations. Apathetic. Speech/language: Fluent, comprehension intact, Increased latency of responses. Slow to move to commands.  Cranial nerves:   CN II Pupils equal and reactive to light.    CN III,IV,VI EOM intact but track slowly.    CN V normal sensation in V1, V2, and V3 segments bilaterally.   CN VII no asymmetry, no nasolabial fold flattening   CN VIII normal hearing to speech   CN IX & X normal palatal elevation, no uvular deviation   CN XI 5/5 head turn and 5/5 shoulder shrug bilaterally    CN XII midline tongue protrusion.    Motor:  She has mildly increased muscle tone in all extremities. Poor effort to move extremities. Waxy rigidity is noted with passive ROM. No cog wheel rigidity.  B/L Lower and Upper Extremities weakness present. Rt Extremity worse than Left. Can left left leg off the bed, can barely get her R leg off the bed.  Sensation: B/L sensations intact and equal.  Cerebellar- Bradykinetic. Gait - Unable to assess.  Labs   Basic Metabolic Panel:  Lab Results  Component Value Date   NA 134 (L) 09/11/2020   K 4.6 09/11/2020   CO2 24 09/11/2020   GLUCOSE 120 (H) 09/11/2020   BUN 27 (H) 09/11/2020   CREATININE 0.77 09/11/2020   CALCIUM 8.5 (L) 09/11/2020   GFRNONAA >60  09/11/2020   HbA1c:  Lab Results  Component Value Date   HGBA1C 6.8 (H) 09/06/2020   LDL: No results found for: [facility] Urine Drug Screen: No results found for: LABOPIA, COCAINSCRNUR, LABBENZ, AMPHETMU, THCU, LABBARB  Alcohol Level No results found for: ETH No results found for: PHENYTOIN, ZONISAMIDE, LAMOTRIGINE, LEVETIRACETA No results found for: PHENYTOIN, PHENOBARB, VALPROATE, CBMZ  Imaging and Diagnostic studies  MR BRAIN WO CONTRAST IMPRESSION: 1. No acute intracranial abnormality. 2. Moderate chronic small vessel ischemic changes and generalized volume  loss.  MRI C, spine: 1. No fracture or traumatic subluxation. 2. Multilevel degenerative changes, as described above. 3. Right carotid artery atheromatous calcifications and minimal left carotid artery atheromatous calcifications  MR THORACIC SPINE Known left hilar mass is better demonstrated on recent chest CT. Mild central canal stenosis and mild to moderate foraminal narrowing at T9-10. Facet degenerative change at this level is worse on the left. Moderate left foraminal narrowing T10-11.   MR LUMBAR SPINE Convex left scoliosis. Degenerative disc disease at L1-2 results in narrowing in the right subarticular recess which could impact the descending right L2 root. Moderate right foraminal narrowing and narrowing in the right subarticular recess at L2-3 could impact the descending right L3 and exiting right L2 roots. Moderate central canal stenosis and left worse than right subarticular recess narrowing at L3-4. There is also moderately severe bilateral foraminal narrowing at this level.  REEG: This study is suggestive of mild diffuse encephalopathy, nonspecific etiology. No seizures or epileptiform discharges were seen throughout the recording.   Impression   Ashlee Greer is a 87 y.o. female with PMH significant for Afib, Anxiety, Depression, Arthritis, DM, Glaucoma, HTN and Hypothyroidism who we evaluated for concern for AMS and BL lower extremity weakness. Exam with intact wakefullness, cognition, albeit slow to respond with mild rigidity. Agree that her presentation is more consistent with Catatonia rather than encephalopathy. As for her Right > left BL lower extremity weakness, I think this is likely due to noted foraminal narrowing noted on the MRI Lumbar spine. Rest of the workup with MRIs and rEEG and encephalopathy labs is non revealing.  On discussion with team, her catatonia is improving and she is more interactive.  Recommendations  -Continue Celexa, Clonazepam,  Aricept and Ambien  (for 5 days Started on 09/08/20). -Avoid Antipsychotics. -OT /PT evaluation and treat. -Up with Assistance. -Outpatient F/U with spine clinic for the noted spinal degenerative disc disease after discharge. - No further inpatient neurological workup at this time. Please feel free to contact us with any questions or concerns. Neurology inpatient team will signoff at this time. ______________________________________________________________________   Thank you for the opportunity to take part in the care of this patient. If you have any further questions, please contact the neurology consultation attending.  Signed,  Dr. Armando Reichert MD PGY1 [facility], [place] Neurology

## 2020-09-12 LAB — COMPREHENSIVE METABOLIC PANEL
ALT: 51 U/L — ABNORMAL HIGH (ref 0–44)
AST: 58 U/L — ABNORMAL HIGH (ref 15–41)
Albumin: 2 g/dL — ABNORMAL LOW (ref 3.5–5.0)
Alkaline Phosphatase: 71 U/L (ref 38–126)
Anion gap: 14 (ref 5–15)
BUN: 22 mg/dL (ref 8–23)
CO2: 24 mmol/L (ref 22–32)
Calcium: 8.6 mg/dL — ABNORMAL LOW (ref 8.9–10.3)
Chloride: 98 mmol/L (ref 98–111)
Creatinine, Ser: 0.7 mg/dL (ref 0.44–1.00)
GFR, Estimated: >60 mL/min (ref >60)
Glucose, Bld: 125 mg/dL — ABNORMAL HIGH (ref 70–99)
Potassium: 5.1 mmol/L (ref 3.5–5.1)
Sodium: 136 mmol/L (ref 135–145)
Total Bilirubin: 0.5 mg/dL (ref 0.3–1.2)
Total Protein: 6 g/dL — ABNORMAL LOW (ref 6.5–8.1)

## 2020-09-12 LAB — GLUCOSE, CAPILLARY
Glucose-Capillary: 136 mg/dL — ABNORMAL HIGH (ref 70–99)
Glucose-Capillary: 168 mg/dL — ABNORMAL HIGH (ref 70–99)
Glucose-Capillary: 138 mg/dL — ABNORMAL HIGH (ref 70–99)
Glucose-Capillary: 143 mg/dL — ABNORMAL HIGH (ref 70–99)

## 2020-09-12 LAB — PHOSPHORUS: Phosphorus: 4.3 mg/dL (ref 2.5–4.6)

## 2020-09-12 LAB — MAGNESIUM: Magnesium: 1.9 mg/dL (ref 1.7–2.4)

## 2020-09-12 MED ORDER — ZOLPIDEM TARTRATE 5 MG PO TABS: 5.0000 mg | ORAL_TABLET | Freq: Every day | ORAL | 0 refills | Status: DC

## 2020-09-12 NOTE — TOC Progression Note (Addendum)
Transition of Care [facility]) - Progression Note    Patient Details  Name: Ashlee Greer MRN: [id number] Date of Birth: 09/10/1932  Transition of Care [facility]) CM/SW [place], [place] Phone Number: 09/12/2020, 12:48 PM  Clinical Narrative:     CSW called [facility] on 1/3 about bed. CSW was informed there would be a bed on 09/12/20.   CSW called [facility] this morning, and was told they already accepted the max amount of pts for today. CSW explained that yesterday she was told a bed would be available today, Maudie Mercury stated there bed situation changed and they can accept of 09/13/20. Pts daughter has been notified.   Pts last covid test was on 09/11/20 and will still be good for discharge. Pt also received 2nd dose of phizer on 09/11/20.   [facility] later called back to say they cannot take pt today due to insurance auth. CSW informed them that pt has traditional medicare and does not need auth. Maudie Mercury stated she will need to do a benefit check and will get back with CSW.    Expected Discharge Plan: [place] Barriers to Discharge: Continued Medical Work up  Expected Discharge Plan and Services Expected Discharge Plan: [place]   Discharge Planning Services: CM Consult Post Acute Care Choice: Durable Medical Equipment Living arrangements for the past 2 months: Single Family Home Expected Discharge Date: 09/12/20                                     Social Determinants of Health (SDOH) Interventions    Readmission Risk Interventions No flowsheet data found.  Emeterio Reeve, Latanya Presser, [place] Social Worker [phone]

## 2020-09-12 NOTE — Progress Notes (Addendum)
Stable for discharge. Seen at bedside, discussed with daughter and pt. See discharge summary.  Discharge delayed today per SNF. Will try again tomorrow. Cytology pending, please follow this 1/5.

## 2020-09-13 DIAGNOSIS — R6520 Severe sepsis without septic shock: Secondary | ICD-10-CM | POA: Diagnosis not present

## 2020-09-13 DIAGNOSIS — A419 Sepsis, unspecified organism: Secondary | ICD-10-CM | POA: Diagnosis not present

## 2020-09-13 LAB — CYTOLOGY - NON PAP

## 2020-09-13 LAB — GLUCOSE, CAPILLARY
Glucose-Capillary: 147 mg/dL — ABNORMAL HIGH (ref 70–99)
Glucose-Capillary: 172 mg/dL — ABNORMAL HIGH (ref 70–99)
Glucose-Capillary: 141 mg/dL — ABNORMAL HIGH (ref 70–99)
Glucose-Capillary: 145 mg/dL — ABNORMAL HIGH (ref 70–99)

## 2020-09-13 NOTE — Progress Notes (Signed)
PROGRESS NOTE    Ashlee Greer  ION:[id number] DOB: 1932/12/28 DOA: 08/30/2020 PCP: [name], MD  Chief Complaint  Patient presents with  . Loss of Consciousness   Brief Narrative:  Ashlee Greer a 87 y.o.femalewith medical history significant ofA. fib not on anticoagulation, anxiety/depression, arthritis, diabetes, glaucoma, hypertension, hypothyroidism who presented with recurrent loss of consciousness secondary to orthostatic hypotension.  Patient was recently discharged from [facility] after treatment for TIA, UTI, pneumonia. She also had some fatigue and orthostasis while she was there. She was discharged on Bactrim for UTI and Levaquin for her pneumonia. She was recommended to go to an SNF at discharge but family is an Therapist, sports and stated they would care for patient at home.  However, her BP remained low prior to discharge which concerned family.  Due to ongoing ortho hypoTN on returning home, family brought patient to the hospital.    Workup in the ER was notable for temp 101.4, tachypnea, tachycardia. CXR showed left basilar infiltrate vs atelectasis.  Lactic was elevated and she was started on IVF and cefepime. Covid and flu swab negative.  She continued to improve with antibiotics. She was evaluated by physical therapy and recommended for SNF rehab at discharge.  This was discussed with her family who was also in agreement.  Assessment & Plan:   Active Problems:   History of TIA (transient ischemic attack)   PAF (paroxysmal atrial fibrillation) (HCC)   Anxiety and depression   Diabetes ([place])   Hypothyroidism   Hand pain, right  Severe sepsis 2/2 Community Acquired Pneumonia (HCC)-resolved as of 09/04/2020 - now clinically improved on IV abx; - follow up cultures: negative to date - completed course  3.8 x 3.5 cm L hilar Mass      - she's completed course of abx as noted above      - s/p flexible bronch and EBUS bronchoscopy 1/1      - follow  FNA results - cytology is pending  Acute Metabolic Encephalopathy       - she was independent ~2 weeks ago at home and has daily decline       - MRI at 12/16 at [facility] was negative for acute events       - head CT without acute findings       - MRI brain (no acute intracranial abnormality, moderate chronic small vessel ischemic changes and generalized volume loss) and EEG (mild diffuse encephalopathy, nonspecific)       - TSH wnl, follow ammonia (wnl), B12 (wnl), RPR (nonreactive)       - appreciate neurology, concern for mild to moderate hypoactive catatonia -> trial of ambien.  Patient alert and oriented however slow in response.  Lower Extremity Weakness            - appreciate neurology recs ->MRI with mild central canal stenosis and mild to oderate foraminal narrowing at T9-10, moderate L foraminal narrowing T10-11.  Convex L scoliosis.  Degenerative disc disease at L1-2 results in narrowing in R subarticular recess (could impact descending R L2 root).  Moderate R foralminal narrowing and narrowing in R subarticular recess at L2-3 could impact descending R L3 and exiting R L2 roots.  Moderate central canal stenosis and L worse than R subarticular recess narrowing at L3-4.  Bilateral foraminal narrowing at this level.  Neurology had opined that her weakness in the right lower extremity is likely secondary to foraminal narrowing.  Orthostatic hypotension-resolved as of 09/02/2020 -Also possibly  multifactorial in setting of infection as well as polypharmacy.  Her medications reviewed and she is on sedating medications including clonazepam as well as anticholinergic mechanisms with nortriptyline which will be held; Previous MD has also counseled her bedside that her clonazepam dose needs to be reduced at discharge and ideally would benefit from weaning off of it altogether (continue to taper given her AMS) -resume celexa at lower dose, 20 mg daily on 12/25 -Continue monitoring blood pressure  which is improving -Repeat orthostatic blood pressure: negative on 12/24  History of Fall       - occurred prior to [place] hospitalization       - ? If related to R wrist pain (see below).  She has neck pain, will get cervical imaging (no fracture or subluxation)      - she's complaining of pain limiting lower extremity strength today -> bilateral hip and R knee film without acute finding (remote proximal fibula fx, tricompartment OA and chondrocalcinosis)  History of TIA (transient ischemic attack) - see afib - never received records from [facility] and depression -See orthostatic hypotension Discontinue Klonopin today. -Celexa on hold but will try to resume soon  PAF (paroxysmal atrial fibrillation) (HCC) Continue Coreg and Xarelto.  Acute lower UTI-resolved as of 09/04/2020 -She does endorse some symptoms.  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              Patient currently is medically stable to d/c.  Consultants:   pulm  Procedures:  none  Antimicrobials:  Anti-infectives (From admission, onward)   Start     Dose/Rate  Route Frequency Ordered Stop   09/02/20 1215  azithromycin (ZITHROMAX) tablet 500 mg        500 mg Oral Daily 09/02/20 1122 09/07/20 1055   08/31/20 1615  metroNIDAZOLE (FLAGYL) tablet 500 mg  Status:  Discontinued        500 mg Oral Every 8 hours 08/31/20 1608 09/06/20 1654   08/30/20 2100  ceFEPIme (MAXIPIME) 2 g in sodium chloride 0.9 % 100 mL IVPB  Status:  Discontinued        2 g 200 mL/hr over 30 Minutes Intravenous Every 12 hours 08/30/20 2011 09/02/20 1122     Subjective: Patient seen and examined.  She had no complaint.  She was alert and fully oriented however she was slow in response and needed some prompts.  Objective: Vitals:   09/12/20 2026 09/12/20 2346 09/13/20 0604 09/13/20 1325  BP: (!) 145/92 (!) 147/78 (!) 154/99 136/79  Pulse: 81 94  98 85  Resp: 18 16 18 20   Temp: 98 F (36.7 C) 97.9 F (36.6 C) 97.9 F (36.6 C) 97.6 F (36.4 C)  TempSrc: Oral Oral Oral Oral  SpO2: 96% 94% 94% 98%  Weight:      Height:        Intake/Output Summary (Last 24 hours) at 09/13/2020 1550 Last data filed at 09/13/2020 0604 Gross per 24 hour  Intake 120 ml  Output 1400 ml  Net -1280 ml   Filed Weights   08/30/20 1748  Weight: 88.5 kg    Examination:  General exam: Appears calm and comfortable  Respiratory system: Clear to auscultation. Respiratory effort normal. Cardiovascular system: S1 & S2 heard, RRR. No JVD, murmurs, rubs, gallops or clicks. No pedal edema. Gastrointestinal system: Abdomen is nondistended, soft and nontender. No organomegaly or masses felt. Normal bowel sounds heard. Central nervous system: Alert and oriented. No focal neurological deficits. Extremities: Symmetric 5 x 5 power. Skin: No rashes, lesions or ulcers.  Psychiatry: Judgement and insight appear normal. Mood & affect appropriate.   Data Reviewed: I have personally reviewed following labs and imaging studies  CBC: Recent Labs  Lab 09/07/20 0046 09/08/20 1437 09/09/20 0238 09/10/20 0139  09/11/20 0246  WBC 11.6* 13.8* 13.1* 10.7* 8.8  NEUTROABS 8.9* 12.0* 10.6* 9.5* 6.4  HGB 11.6* 11.5* 11.3* 10.4* 10.5*  HCT 35.4* 35.8* 35.4* 32.9* 31.6*  MCV 91.2 93.0 92.7 94.0 92.9  PLT 326 339 340 320 379    Basic Metabolic Panel: Recent Labs  Lab 09/07/20 0046 09/08/20 0100 09/08/20 0853 09/09/20 0238 09/10/20 0139 09/11/20 0246 09/12/20 0127  NA 131*  --  133* 134* 135 134* 136  K 4.3  --  4.6 4.5 5.1 4.6 5.1  CL 99  --  98 100 100 99 98  CO2 22  --  23 24 24 24 24   GLUCOSE 144*  --  151* 157* 190* 120* 125*  BUN 12  --  18 19 25* 27* 22  CREATININE 0.70  --  0.75 0.71 0.72 0.77 0.70  CALCIUM 8.4*  --  8.0* 8.5* 8.6* 8.5* 8.6*  MG 1.9 1.9  --  2.1 2.1 2.0 1.9  PHOS 2.8  --   --  3.2 3.9 3.3 4.3    GFR: Estimated Creatinine Clearance: 52 mL/min (by C-G formula based on SCr of 0.7 mg/dL).  Liver Function Tests: Recent Labs  Lab 09/08/20 0853 09/09/20 0238 09/10/20 0139 09/11/20 0246 09/12/20 0127  AST 27 68* 47* 75* 58*  ALT 19 37 35 52* 51*  ALKPHOS 65 81 70 68 71  BILITOT 0.4 0.6 0.6 0.6 0.5  PROT 6.1* 6.4* 6.1* 6.3* 6.0*  ALBUMIN 1.8* 1.8* 1.7* 1.8* 2.0*    CBG: Recent Labs  Lab 09/12/20 1156 09/12/20 1637 09/12/20 2100 09/13/20 0746 09/13/20 1155  GLUCAP 168* 138* 143* 147* 172*     Recent Results (from the past 240 hour(s))  Resp Panel by RT-PCR (Flu A&B, Covid) Nasopharyngeal Swab     Status: None   Collection Time: 09/05/20  4:47 PM   Specimen: Nasopharyngeal Swab; Nasopharyngeal(NP) swabs in vial transport medium  Result Value Ref Range Status   SARS Coronavirus 2 by RT PCR NEGATIVE NEGATIVE Final    Comment: (NOTE) SARS-CoV-2 target nucleic acids are NOT DETECTED.  The SARS-CoV-2 RNA is generally detectable in upper respiratory specimens during the acute phase of infection. The lowest concentration of SARS-CoV-2 viral copies this assay can detect is 138 copies/mL. A  negative result does not preclude SARS-Cov-2 infection and  should not be used as the sole basis for treatment or other patient management decisions. A negative result may occur with  improper specimen collection/handling, submission of specimen other than nasopharyngeal swab, presence of viral mutation(s) within the areas targeted by this assay, and inadequate number of viral copies(<138 copies/mL). A negative result must be combined with clinical observations, patient history, and epidemiological information. The expected result is Negative.  Fact Sheet for Patients:  EntrepreneurPulse.com.au  Fact Sheet for Healthcare Providers:  IncredibleEmployment.be  This test is no t yet approved or cleared by the [place] FDA and  has been authorized for detection and/or diagnosis of SARS-CoV-2 by FDA under an Emergency Use Authorization (EUA). This EUA will remain  in effect (meaning this test can be used) for the duration of the COVID-19 declaration under Section 564(b)(1) of the Act, 21 U.S.C.section 360bbb-3(b)(1), unless the authorization is terminated  or revoked sooner.       Influenza A by PCR NEGATIVE NEGATIVE Final   Influenza B by PCR NEGATIVE NEGATIVE Final    Comment: (NOTE) The Xpert Xpress SARS-CoV-2/FLU/RSV plus assay is intended as an aid in the diagnosis of influenza from Nasopharyngeal swab specimens and should not be used as a sole basis for treatment. Nasal washings and aspirates are unacceptable for Xpert Xpress SARS-CoV-2/FLU/RSV testing.  Fact Sheet for Patients: EntrepreneurPulse.com.au  Fact Sheet for Healthcare Providers: IncredibleEmployment.be  This test is not yet approved or cleared by the [place] FDA and has been authorized for detection and/or diagnosis of SARS-CoV-2 by FDA under an Emergency Use Authorization (EUA). This EUA will remain in effect (meaning this test can be used) for the duration of the COVID-19 declaration  under Section 564(b)(1) of the Act, 21 U.S.C. section 360bbb-3(b)(1), unless the authorization is terminated or revoked.  Performed at [facility], [place] [address] [name], [place] [address]   Resp Panel by RT-PCR (Flu A&B, Covid) Nasopharyngeal Swab     Status: None   Collection Time: 09/11/20  2:07 PM   Specimen: Nasopharyngeal Swab; Nasopharyngeal(NP) swabs in vial transport medium  Result Value Ref Range Status   SARS Coronavirus 2 by RT PCR NEGATIVE NEGATIVE Final    Comment: (NOTE) SARS-CoV-2 target nucleic acids are NOT DETECTED.  The SARS-CoV-2 RNA is generally detectable in upper respiratory specimens during the acute phase of infection. The lowest concentration of SARS-CoV-2 viral copies this assay can detect is 138 copies/mL. A negative result does not preclude SARS-Cov-2 infection and should not be used as the sole basis for treatment or other patient management decisions. A negative result may occur with  improper specimen collection/handling, submission of specimen other than nasopharyngeal swab, presence of viral mutation(s) within the areas targeted by this assay, and inadequate number of viral copies(<138 copies/mL). A negative result must be combined with clinical observations, patient history, and epidemiological information. The expected result is Negative.  Fact Sheet for Patients:  EntrepreneurPulse.com.au  Fact Sheet for Healthcare Providers:  IncredibleEmployment.be  This test is no t yet approved or cleared by the [place] FDA and  has been authorized for detection and/or diagnosis of SARS-CoV-2 by FDA under an Emergency Use Authorization (EUA). This EUA will remain  in effect (meaning this test can be used) for the duration of the COVID-19 declaration under Section 564(b)(1) of the Act, 21 U.S.C.section 360bbb-3(b)(1), unless the authorization is terminated  or revoked sooner.       Influenza A by  PCR  NEGATIVE NEGATIVE Final   Influenza B by PCR NEGATIVE NEGATIVE Final    Comment: (NOTE) The Xpert Xpress SARS-CoV-2/FLU/RSV plus assay is intended as an aid in the diagnosis of influenza from Nasopharyngeal swab specimens and should not be used as a sole basis for treatment. Nasal washings and aspirates are unacceptable for Xpert Xpress SARS-CoV-2/FLU/RSV testing.  Fact Sheet for Patients: EntrepreneurPulse.com.au  Fact Sheet for Healthcare Providers: IncredibleEmployment.be  This test is not yet approved or cleared by the [place] FDA and has been authorized for detection and/or diagnosis of SARS-CoV-2 by FDA under an Emergency Use Authorization (EUA). This EUA will remain in effect (meaning this test can be used) for the duration of the COVID-19 declaration under Section 564(b)(1) of the Act, 21 U.S.C. section 360bbb-3(b)(1), unless the authorization is terminated or revoked.  Performed at [facility], [place] [address], [place], [place] [address]          Radiology Studies: No results found.      Scheduled Meds: . carvedilol  3.125 mg Oral BID WC  . citalopram  20 mg Oral QHS  . donepezil  10 mg Oral QHS  . insulin aspart  0-15 Units Subcutaneous TID WC  . latanoprost  1 drop Both Eyes QHS  . levothyroxine  50 mcg Oral QAC breakfast  . pantoprazole  40 mg Oral Daily  . rivaroxaban  20 mg Oral Q supper  . sodium chloride flush  3 mL Intravenous Q12H   Continuous Infusions:   LOS: 13 days    Time spent: over 33 min Darliss Cheney, MD [facility]  To contact the attending provider between 7A-7P or the covering provider during after hours 7P-7A, please log into the web site [email or web address] and access using universal [place] password for that web site. If you do not have the password, please call the hospital operator.  09/13/2020, 3:50 PM

## 2020-09-13 NOTE — Progress Notes (Addendum)
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  She will need to have repeat bronchoscopy.  Xarelto last given on 09/12/2020 This was discontinued from [facility]. Plan for repeat bronchoscopy on Friday with Dr. Valeta Harms.    I will arrange this.   I have spoke with patients Daughter as well as Dr. Doristine Bosworth.   Please have her NPO Thursday night.   Procedure has been scheduled into [facility] OR for Friday with me.   [facility] 09/13/2020 4:14 PM

## 2020-09-13 NOTE — Progress Notes (Signed)
[facility] PCCM  EBUS 09/10/19 Cytology results still pending  P: PCCM will contact patient when results available  Will sign off.  Rodman Pickle, M.D. [facility] 09/13/2020 7:17 AM

## 2020-09-13 NOTE — Plan of Care (Signed)
Problem: Coping: Goal: Level of anxiety will decrease Outcome: Progressing   Problem: Pain Managment: Goal: General experience of comfort will improve Outcome: Progressing   Problem: Safety: Goal: Ability to remain free from injury will improve Outcome: Progressing   Problem: Skin Integrity: Goal: Risk for impaired skin integrity will decrease Outcome: Progressing   Problem: Education: Goal: Knowledge of disease or condition will improve Outcome: Progressing Goal: Knowledge of secondary prevention will improve Outcome: Progressing Goal: Knowledge of patient specific risk factors addressed and post discharge goals established will improve Outcome: Progressing Goal: Individualized Educational Video(s) Outcome: Progressing   Problem: Coping: Goal: Will verbalize positive feelings about self Outcome: Progressing Goal: Will identify appropriate support needs Outcome: Progressing   Problem: Health Behavior/Discharge Planning: Goal: Ability to manage health-related needs will improve Outcome: Progressing   Problem: Self-Care: Goal: Ability to participate in self-care as condition permits will improve Outcome: Progressing Goal: Verbalization of feelings and concerns over difficulty with self-care will improve Outcome: Progressing Goal: Ability to communicate needs accurately will improve Outcome: Progressing   Problem: Nutrition: Goal: Risk of aspiration will decrease Outcome: Progressing Goal: Dietary intake will improve Outcome: Progressing   Problem: Ischemic Stroke/TIA Tissue Perfusion: Goal: Complications of ischemic stroke/TIA will be minimized Outcome: Progressing   Problem: Education: Goal: Utilization of techniques to improve thought processes will improve Outcome: Progressing Goal: Knowledge of the prescribed therapeutic regimen will improve Outcome: Progressing   Problem: Activity: Goal: Interest or engagement in leisure activities will  improve Outcome: Progressing Goal: Imbalance in normal sleep/wake cycle will improve Outcome: Progressing   Problem: Coping: Goal: Coping ability will improve Outcome: Progressing Goal: Will verbalize feelings Outcome: Progressing   Problem: Health Behavior/Discharge Planning: Goal: Ability to make decisions will improve Outcome: Progressing Goal: Compliance with therapeutic regimen will improve Outcome: Progressing   Problem: Role Relationship: Goal: Will demonstrate positive changes in social behaviors and relationships Outcome: Progressing   Problem: Safety: Goal: Ability to disclose and discuss suicidal ideas will improve Outcome: Progressing Goal: Ability to identify and utilize support systems that promote safety will improve Outcome: Progressing   Problem: Self-Concept: Goal: Will verbalize positive feelings about self Outcome: Progressing Goal: Level of anxiety will decrease Outcome: Progressing   Problem: Education: Goal: Ability to state activities that reduce stress will improve Outcome: Progressing   Problem: Coping: Goal: Ability to identify and develop effective coping behavior will improve Outcome: Progressing   Problem: Self-Concept: Goal: Ability to identify factors that promote anxiety will improve Outcome: Progressing Goal: Level of anxiety will decrease Outcome: Progressing Goal: Ability to modify response to factors that promote anxiety will improve Outcome: Progressing   Problem: Education: Goal: Ability to describe self-care measures that may prevent or decrease complications (Diabetes Survival Skills Education) will improve Outcome: Progressing Goal: Individualized Educational Video(s) Outcome: Progressing   Problem: Coping: Goal: Ability to adjust to condition or change in health will improve Outcome: Progressing   Problem: Fluid Volume: Goal: Ability to maintain a balanced intake and output will improve Outcome: Progressing    Problem: Health Behavior/Discharge Planning: Goal: Ability to identify and utilize available resources and services will improve Outcome: Progressing Goal: Ability to manage health-related needs will improve Outcome: Progressing   Problem: Metabolic: Goal: Ability to maintain appropriate glucose levels will improve Outcome: Progressing   Problem: Nutritional: Goal: Maintenance of adequate nutrition will improve Outcome: Progressing Goal: Progress toward achieving an optimal weight will improve Outcome: Progressing   Problem: Skin Integrity: Goal: Risk for impaired skin integrity will decrease Outcome: Progressing  Problem: Tissue Perfusion: Goal: Adequacy of tissue perfusion will improve Outcome: Progressing

## 2020-09-14 DIAGNOSIS — A419 Sepsis, unspecified organism: Secondary | ICD-10-CM | POA: Diagnosis not present

## 2020-09-14 DIAGNOSIS — R6520 Severe sepsis without septic shock: Secondary | ICD-10-CM | POA: Diagnosis not present

## 2020-09-14 LAB — GLUCOSE, CAPILLARY
Glucose-Capillary: 149 mg/dL — ABNORMAL HIGH (ref 70–99)
Glucose-Capillary: 162 mg/dL — ABNORMAL HIGH (ref 70–99)
Glucose-Capillary: 122 mg/dL — ABNORMAL HIGH (ref 70–99)
Glucose-Capillary: 140 mg/dL — ABNORMAL HIGH (ref 70–99)

## 2020-09-14 MED ORDER — BISACODYL 10 MG RE SUPP
10.0000 mg | Freq: Once | RECTAL | Status: AC
  Administered 2020-09-14: 10 mg via RECTAL
  Filled 2020-09-14: qty 1

## 2020-09-14 NOTE — Progress Notes (Signed)
Physical Therapy Treatment Patient Details Name: Ashlee Greer MRN: [id number] DOB: 1933/05/04 Today's Date: 09/14/2020    History of Present Illness 87 y.o. female with medical history significant of A. fib not on anticoagulation, anxiety/depression, arthritis, diabetes, glaucoma, hypertension, hypothyroidism who presents with recurrent loss of consciousness secondary to orthostatic hypotension. Patient was recently discharged from [facility] after treatment for TIA, UTI, pneumonia.  She also had some fatigue and orthostasis while she was there. She was recommended to go to an SNF at discharge but family is an Therapist, sports and thought they could handle her at home.  Family states that however protested her discharged and got an extra day because of this prior to discharge.  However, after returning home, her blood pressures consistently dropped on standing and has gotten so bad that she had episodes of syncope while working with PT.    PT Comments    Pt supine on arrival, agreeable to therapy session and with good participation and tolerance for mobility. Pt with improved seated tolerance this date at EOB, but remains limited due to poor balance/posterior lean and significant global deconditioning. Pt performed seated BLE AAROM therapeutic exercises with heavy encouragement to initiate and increased time to perform, with fair tolerance. Pt performed bed mobility with +18maxA to totalA. Pt reports symptoms of dizziness after sitting ~5 minutes however VSS per dinamap (BP 121/79 seated and BP 122/67 supine, SpO2 92% and HR 92-94 bpm during mobility. Pt continues to benefit from PT services to progress toward functional mobility goals. D/C recs below remain appropriate.   Follow Up Recommendations  SNF;Supervision/Assistance - 24 hour     Equipment Recommendations  Other (comment)    Recommendations for Other Services OT consult     Precautions / Restrictions Precautions Precautions:  Fall Precaution Comments: RUE chronic wrist fracture Required Braces or Orthoses: Splint/Cast Splint/Cast: R prefab wrist brace with thumb spica splint Restrictions Weight Bearing Restrictions: No    Mobility  Bed Mobility Overal bed mobility: Needs Assistance Bed Mobility: Rolling;Supine to Sit;Sit to Supine Rolling: Max assist   Supine to sit: Max assist;+2 for physical assistance;HOB elevated Sit to supine: Total assist;+2 for physical assistance   General bed mobility comments: pt needs max cues and tactile assist for cross-body reaching to opposite bed rail to assist with pulling her body onto side; cues for log roll and pt following instruction well but needs maxA due to deconditioning; posterior lean upon sitting upright; total A to return to supine 2/2 fatigue  Transfers                 General transfer comment: pt would not attempt, pt c/o dizziness seated EOB however VSS  Ambulation/Gait                 Stairs             Wheelchair Mobility    Modified Rankin (Stroke Patients Only)       Balance Overall balance assessment: Needs assistance Sitting-balance support: Single extremity supported;Feet supported Sitting balance-Leahy Scale: Zero Sitting balance - Comments: max to totalA for seated balance                                    Cognition Arousal/Alertness: Awake/alert Behavior During Therapy: Anxious Overall Cognitive Status: Impaired/Different from baseline Area of Impairment: Safety/judgement;Awareness;Problem solving  Orientation Level: Situation   Memory: Decreased short-term memory Following Commands: Follows one step commands with increased time Safety/Judgement: Decreased awareness of deficits;Decreased awareness of safety Awareness: Intellectual Problem Solving: Slow processing;Requires verbal cues;Requires tactile cues;Decreased initiation General Comments: pt anxious but  participatory, not very verbal; slow to process cues      Exercises General Exercises - Lower Extremity Ankle Circles/Pumps: AAROM;Strengthening;Both;Seated;20 reps;Supine Long Arc Quad: AAROM;Strengthening;Both;10 reps;Seated Hip Flexion/Marching: AAROM;Strengthening;Both;10 reps;Seated    General Comments        Pertinent Vitals/Pain Pain Assessment: Faces Faces Pain Scale: Hurts even more Pain Location: R wrist Pain Descriptors / Indicators: Grimacing;Moaning;Guarding Pain Intervention(s): Monitored during session;Repositioned (pt kept from WB through R wrist due to hx chronic fx)    Home Living                      Prior Function            PT Goals (current goals can now be found in the care plan section) Acute Rehab PT Goals Patient Stated Goal: to get stronger PT Goal Formulation: With patient/family Time For Goal Achievement: 09/18/20 Potential to Achieve Goals: Fair Progress towards PT goals: Progressing toward goals (slow progress)    Frequency    Min 2X/week      PT Plan Current plan remains appropriate    Co-evaluation              AM-PAC PT "6 Clicks" Mobility   Outcome Measure  Help needed turning from your back to your side while in a flat bed without using bedrails?: A Lot Help needed moving from lying on your back to sitting on the side of a flat bed without using bedrails?: A Lot Help needed moving to and from a bed to a chair (including a wheelchair)?: Total Help needed standing up from a chair using your arms (e.g., wheelchair or bedside chair)?: Total Help needed to walk in hospital room?: Total Help needed climbing 3-5 steps with a railing? : Total 6 Click Score: 8    End of Session Equipment Utilized During Treatment: Gait belt Activity Tolerance: Patient limited by fatigue;Patient tolerated treatment well Patient left: in bed;with call bell/phone within reach;with bed alarm set;with family/visitor present (bed in chair  position, daughter present) Nurse Communication: Mobility status PT Visit Diagnosis: Unsteadiness on feet (R26.81);Other abnormalities of gait and mobility (R26.89);Muscle weakness (generalized) (M62.81);Difficulty in walking, not elsewhere classified (R26.2)     Time: 1601-0932 PT Time Calculation (min) (ACUTE ONLY): 23 min  Charges:  $Therapeutic Exercise: 8-22 mins $Therapeutic Activity: 8-22 mins                     Orry Sigl P., PTA Acute Rehabilitation Services Pager: [phone] Office: [place] 09/14/2020, 3:20 PM

## 2020-09-14 NOTE — TOC Progression Note (Signed)
Transition of Care [facility]) - Progression Note    Patient Details  Name: [name] MRN: [id number] Date of Birth: 1932/10/25  Transition of Care [facility] CM/SW [place], [place] Phone Number: 09/14/2020, 2:46 PM  Clinical Narrative:     Per MD, pt will be ready to DC on Saturday 09/16/20. CSW confirmed that [facility] can accept pt Saturday. [facility] admission coordinator, Maudie Mercury [phone].   Pts Josem Kaufmann is approved 1/6- 1/10. Auth ID [id number]. Care coordinator Lynnell Jude.   Expected Discharge Plan: [place] Barriers to Discharge: Continued Medical Work up  Expected Discharge Plan and Services Expected Discharge Plan: [place]   Discharge Planning Services: CM Consult Post Acute Care Choice: Durable Medical Equipment Living arrangements for the past 2 months: Single Family Home Expected Discharge Date: 09/12/20                                     Social Determinants of Health (SDOH) Interventions    Readmission Risk Interventions No flowsheet data found.  Emeterio Reeve, Latanya Presser, [place] Social Worker [phone]

## 2020-09-14 NOTE — Progress Notes (Signed)
PROGRESS NOTE    Ashlee Greer DOB: 11-28-1932 DOA: 08/30/2020 PCP: [name], MD  Chief Complaint  Patient presents with  . Loss of Consciousness   Brief Narrative:  Ashlee Greer a 87 y.o.femalewith medical history significant ofA. fib not on anticoagulation, anxiety/depression, arthritis, diabetes, glaucoma, hypertension, hypothyroidism who presented with recurrent loss of consciousness secondary to orthostatic hypotension.  Patient was recently discharged from [facility] after treatment for TIA, UTI, pneumonia. She also had some fatigue and orthostasis while she was there. She was discharged on Bactrim for UTI and Levaquin for her pneumonia. She was recommended to go to an SNF at discharge but family is an Therapist, sports and stated they would care for patient at home.  However, her BP remained low prior to discharge which concerned family.  Due to ongoing ortho hypoTN on returning home, family brought patient to the hospital.    Workup in the ER was notable for temp 101.4, tachypnea, tachycardia. CXR showed left basilar infiltrate vs atelectasis.  Lactic was elevated and she was started on IVF and cefepime. Covid and flu swab negative.  She continued to improve with antibiotics. She was evaluated by physical therapy and recommended for SNF rehab at discharge.  This was discussed with her family who was also in agreement.  Assessment & Plan:   Active Problems:   History of TIA (transient ischemic attack)   PAF (paroxysmal atrial fibrillation) (HCC)   Anxiety and depression   Diabetes (HCC)   Hypothyroidism   Hand pain, right  Severe sepsis 2/2 Community Acquired Pneumonia (HCC)-resolved as of 09/04/2020 - now clinically improved on IV abx; - follow up cultures: negative to date - completed course  3.8 x 3.5 cm L hilar Mass      - she's completed course of abx as noted above      - s/p flexible bronch and EBUS bronchoscopy 1/1.  Pathology  showed all necrotic tissue.  PCCM has a scheduled her for bronchoscopy and biopsy tomorrow.  They also talk to the patient's daughter and answered several questions.        Acute Metabolic Encephalopathy       - she was independent ~2 weeks ago at home and has daily decline       - MRI at 12/16 at [facility] was negative for acute events       - head CT without acute findings       - MRI brain (no acute intracranial abnormality, moderate chronic small vessel ischemic changes and generalized volume loss) and EEG (mild diffuse encephalopathy, nonspecific)       - TSH wnl,  ammonia (wnl), B12 (wnl), RPR (nonreactive)       - appreciate neurology, concern for mild to moderate hypoactive catatonia -> trial of ambien.  Patient alert and oriented however slow in response.  Lower Extremity Weakness            - appreciate neurology recs ->MRI with mild central canal stenosis and mild to oderate foraminal narrowing at T9-10, moderate L foraminal narrowing T10-11.  Convex L scoliosis.  Degenerative disc disease at L1-2 results in narrowing in R subarticular recess (could impact descending R L2 root).  Moderate R foralminal narrowing and narrowing in R subarticular recess at L2-3 could impact descending R L3 and exiting R L2 roots.  Moderate central canal stenosis and L worse than R subarticular recess narrowing at L3-4.  Bilateral foraminal narrowing at this level.  Neurology had opined  that her weakness in the right lower extremity is likely secondary to foraminal narrowing.  Orthostatic hypotension-resolved as of 09/02/2020 -Also possibly multifactorial in setting of infection as well as polypharmacy.  Her medications reviewed and she is on sedating medications including clonazepam as well as anticholinergic mechanisms with nortriptyline which were held; Previous MD has also counseled her bedside that her clonazepam dose needs to be reduced at discharge and ideally would benefit from weaning off of it  altogether.  This has been weaned off and stopped on 09/13/2020. -Continue celexa at lower dose, 20 mg daily on 12/25 -Continue monitoring blood pressure which is improving -Repeat orthostatic blood pressure: negative on 12/24  History of Fall       - occurred prior to [facility] hospitalization       - ? If related to R wrist pain (see below).  She has neck pain, will get cervical imaging (no fracture or subluxation)      - she's complaining of pain limiting lower extremity strength today -> bilateral hip and R knee film without acute finding (remote proximal fibula fx, tricompartment OA and chondrocalcinosis)  History of TIA (transient ischemic attack) - see afib - never received records from [facility] and depression -See orthostatic hypotension Discontinued Klonopin on 09/13/2020. -Celexa continued.  PAF (paroxysmal atrial fibrillation) (HCC) Continue Coreg.  Xarelto is on hold for pending bronchoscopy and biopsy tomorrow.  Acute lower UTI-resolved as of 09/04/2020 -She does endorse some symptoms.  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  Continue SSI and CBG monitoring - follow A1c (6.8)  DVT prophylaxis: xarelto Code Status: partial (no CPR, ok with intubation) Family Communication: Discussed with daughter over the phone.  She was requesting colonoscopy done while inpatient just because of constipation. Disposition:   Status is: Inpatient  Remains inpatient appropriate because:Inpatient level of care appropriate due to severity of illness   Dispo: The patient is from: Home              Anticipated d/c  is to: SNF              Anticipated d/c date is: 2 to 3 days.              Patient currently is medically stable to d/c.  Consultants:   pulm  Procedures:  As above  Antimicrobials:  Anti-infectives (From admission, onward)   Start     Dose/Rate Route Frequency Ordered Stop   09/02/20 1215  azithromycin (ZITHROMAX) tablet 500 mg        500 mg Oral Daily 09/02/20 1122 09/07/20 1055   08/31/20 1615  metroNIDAZOLE (FLAGYL) tablet 500 mg  Status:  Discontinued        500 mg Oral Every 8 hours 08/31/20 1608 09/06/20 1654   08/30/20 2100  ceFEPIme (MAXIPIME) 2 g in sodium chloride 0.9 % 100 mL IVPB  Status:  Discontinued        2 g 200 mL/hr over 30 Minutes Intravenous Every 12 hours 08/30/20 2011 09/02/20 1122     Subjective: Patient seen and examined.  States that she has not had any bowel movement  in last couple of days despite of taking MiraLAX and that she needs something stronger.  No other complaint.  Objective: Vitals:   09/13/20 1325 09/13/20 2049 09/14/20 0500 09/14/20 1304  BP: 136/79 136/87 (!) 145/81 (!) 117/57  Pulse: 85 81 89 90  Resp: 20 (!) 21 18 18   Temp: 97.6 F (36.4 C) 97.6 F (36.4 C) 98.2 F (36.8 C) 97.6 F (36.4 C)  TempSrc: Oral Oral Oral Oral  SpO2: 98% 94% 95% 96%  Weight:      Height:        Intake/Output Summary (Last 24 hours) at 09/14/2020 1357 Last data filed at 09/14/2020 0800 Gross per 24 hour  Intake 300 ml  Output 1700 ml  Net -1400 ml   Filed Weights   08/30/20 1748  Weight: 88.5 kg    Examination:  General exam: Appears calm and comfortable  Respiratory system: Clear to auscultation. Respiratory effort normal. Cardiovascular system: S1 & S2 heard, RRR. No JVD, murmurs, rubs, gallops or clicks. No pedal edema. Gastrointestinal system: Abdomen is nondistended, soft and nontender. No organomegaly or masses felt. Normal bowel sounds heard. Central nervous system: Alert and oriented. No focal neurological deficits. Extremities:  Symmetric 5 x 5 power. Skin: No rashes, lesions or ulcers.  Psychiatry: Judgement and insight appear normal. Mood & affect appropriate.   Data Reviewed: I have personally reviewed following labs and imaging studies  CBC: Recent Labs  Lab 09/08/20 1437 09/09/20 0238 09/10/20 0139 09/11/20 0246  WBC 13.8* 13.1* 10.7* 8.8  NEUTROABS 12.0* 10.6* 9.5* 6.4  HGB 11.5* 11.3* 10.4* 10.5*  HCT 35.8* 35.4* 32.9* 31.6*  MCV 93.0 92.7 94.0 92.9  PLT 339 340 320 768    Basic Metabolic Panel: Recent Labs  Lab 09/08/20 0100 09/08/20 0853 09/09/20 0238 09/10/20 0139 09/11/20 0246 09/12/20 0127  NA  --  133* 134* 135 134* 136  K  --  4.6 4.5 5.1 4.6 5.1  CL  --  98 100 100 99 98  CO2  --  23 24 24 24 24   GLUCOSE  --  151* 157* 190* 120* 125*  BUN  --  18 19 25* 27* 22  CREATININE  --  0.75 0.71 0.72 0.77 0.70  CALCIUM  --  8.0* 8.5* 8.6* 8.5* 8.6*  MG 1.9  --  2.1 2.1 2.0 1.9  PHOS  --   --  3.2 3.9 3.3 4.3    GFR: Estimated Creatinine Clearance: 52 mL/min (by C-G formula based on SCr of 0.7 mg/dL).  Liver Function Tests: Recent Labs  Lab 09/08/20 0853 09/09/20 0238 09/10/20 0139 09/11/20 0246 09/12/20 0127  AST 27 68* 47* 75* 58*  ALT 19 37 35 52* 51*  ALKPHOS 65 81 70 68 71  BILITOT 0.4 0.6 0.6 0.6 0.5  PROT 6.1* 6.4* 6.1* 6.3* 6.0*  ALBUMIN 1.8* 1.8* 1.7* 1.8* 2.0*    CBG: Recent Labs  Lab 09/13/20 1155 09/13/20 1637 09/13/20 2045 09/14/20 0803 09/14/20 1135  GLUCAP 172* 141* 145* 149* 162*     Recent Results (from the past 240 hour(s))  Resp Panel by RT-PCR (Flu A&B, Covid) Nasopharyngeal Swab     Status: None   Collection Time: 09/05/20  4:47 PM   Specimen: Nasopharyngeal Swab; Nasopharyngeal(NP) swabs in vial transport medium  Result Value Ref Range Status   SARS Coronavirus 2 by RT PCR NEGATIVE NEGATIVE Final    Comment: (NOTE) SARS-CoV-2 target nucleic acids are NOT DETECTED.  The SARS-CoV-2 RNA is generally detectable  in upper  respiratory specimens during the acute phase of infection. The lowest concentration of SARS-CoV-2 viral copies this assay can detect is 138 copies/mL. A negative result does not preclude SARS-Cov-2 infection and should not be used as the sole basis for treatment or other patient management decisions. A negative result may occur with  improper specimen collection/handling, submission of specimen other than nasopharyngeal swab, presence of viral mutation(s) within the areas targeted by this assay, and inadequate number of viral copies(<138 copies/mL). A negative result must be combined with clinical observations, patient history, and epidemiological information. The expected result is Negative.  Fact Sheet for Patients:  EntrepreneurPulse.com.au  Fact Sheet for Healthcare Providers:  IncredibleEmployment.be  This test is no t yet approved or cleared by the [place] FDA and  has been authorized for detection and/or diagnosis of SARS-CoV-2 by FDA under an Emergency Use Authorization (EUA). This EUA will remain  in effect (meaning this test can be used) for the duration of the COVID-19 declaration under Section 564(b)(1) of the Act, 21 U.S.C.section 360bbb-3(b)(1), unless the authorization is terminated  or revoked sooner.       Influenza A by PCR NEGATIVE NEGATIVE Final   Influenza B by PCR NEGATIVE NEGATIVE Final    Comment: (NOTE) The Xpert Xpress SARS-CoV-2/FLU/RSV plus assay is intended as an aid in the diagnosis of influenza from Nasopharyngeal swab specimens and should not be used as a sole basis for treatment. Nasal washings and aspirates are unacceptable for Xpert Xpress SARS-CoV-2/FLU/RSV testing.  Fact Sheet for Patients: EntrepreneurPulse.com.au  Fact Sheet for Healthcare Providers: IncredibleEmployment.be  This test is not yet approved or cleared by the [place] FDA and has been  authorized for detection and/or diagnosis of SARS-CoV-2 by FDA under an Emergency Use Authorization (EUA). This EUA will remain in effect (meaning this test can be used) for the duration of the COVID-19 declaration under Section 564(b)(1) of the Act, 21 U.S.C. section 360bbb-3(b)(1), unless the authorization is terminated or revoked.  Performed at [facility], [place] [address], [place], [place] [address]   Resp Panel by RT-PCR (Flu A&B, Covid) Nasopharyngeal Swab     Status: None   Collection Time: 09/11/20  2:07 PM   Specimen: Nasopharyngeal Swab; Nasopharyngeal(NP) swabs in vial transport medium  Result Value Ref Range Status   SARS Coronavirus 2 by RT PCR NEGATIVE NEGATIVE Final    Comment: (NOTE) SARS-CoV-2 target nucleic acids are NOT DETECTED.  The SARS-CoV-2 RNA is generally detectable in upper respiratory specimens during the acute phase of infection. The lowest concentration of SARS-CoV-2 viral copies this assay can detect is 138 copies/mL. A negative result does not preclude SARS-Cov-2 infection and should not be used as the sole basis for treatment or other patient management decisions. A negative result may occur with  improper specimen collection/handling, submission of specimen other than nasopharyngeal swab, presence of viral mutation(s) within the areas targeted by this assay, and inadequate number of viral copies(<138 copies/mL). A negative result must be combined with clinical observations, patient history, and epidemiological information. The expected result is Negative.  Fact Sheet for Patients:  EntrepreneurPulse.com.au  Fact Sheet for Healthcare Providers:  IncredibleEmployment.be  This test is no t yet approved or cleared by the [place] FDA and  has been authorized for detection and/or diagnosis of SARS-CoV-2 by FDA under an Emergency Use Authorization (EUA). This EUA will remain  in effect (meaning this test  can be used) for the duration of the COVID-19 declaration under  Section 564(b)(1) of the Act, 21 U.S.C.section 360bbb-3(b)(1), unless the authorization is terminated  or revoked sooner.       Influenza A by PCR NEGATIVE NEGATIVE Final   Influenza B by PCR NEGATIVE NEGATIVE Final    Comment: (NOTE) The Xpert Xpress SARS-CoV-2/FLU/RSV plus assay is intended as an aid in the diagnosis of influenza from Nasopharyngeal swab specimens and should not be used as a sole basis for treatment. Nasal washings and aspirates are unacceptable for Xpert Xpress SARS-CoV-2/FLU/RSV testing.  Fact Sheet for Patients: EntrepreneurPulse.com.au  Fact Sheet for Healthcare Providers: IncredibleEmployment.be  This test is not yet approved or cleared by the [place] FDA and has been authorized for detection and/or diagnosis of SARS-CoV-2 by FDA under an Emergency Use Authorization (EUA). This EUA will remain in effect (meaning this test can be used) for the duration of the COVID-19 declaration under Section 564(b)(1) of the Act, 21 U.S.C. section 360bbb-3(b)(1), unless the authorization is terminated or revoked.  Performed at [facility], [place] [address] [place], [place] [address]          Radiology Studies: No results found.      Scheduled Meds: . carvedilol  3.125 mg Oral BID WC  . citalopram  20 mg Oral QHS  . donepezil  10 mg Oral QHS  . insulin aspart  0-15 Units Subcutaneous TID WC  . latanoprost  1 drop Both Eyes QHS  . levothyroxine  50 mcg Oral QAC breakfast  . pantoprazole  40 mg Oral Daily  . sodium chloride flush  3 mL Intravenous Q12H   Continuous Infusions:   LOS: 14 days    Time spent: over 30 min Darliss Cheney, MD [facility]  To contact the attending provider between 7A-7P or the covering provider during after hours 7P-7A, please log into the web site [email or web address] and access using universal [place]  password for that web site. If you do not have the password, please call the hospital operator.  09/14/2020, 1:57 PM

## 2020-09-14 NOTE — Plan of Care (Signed)
Problem: Coping: Goal: Level of anxiety will decrease Outcome: Progressing   Problem: Pain Managment: Goal: General experience of comfort will improve Outcome: Progressing   Problem: Safety: Goal: Ability to remain free from injury will improve Outcome: Progressing   Problem: Skin Integrity: Goal: Risk for impaired skin integrity will decrease Outcome: Progressing   Problem: Education: Goal: Knowledge of disease or condition will improve Outcome: Progressing Goal: Knowledge of secondary prevention will improve Outcome: Progressing Goal: Knowledge of patient specific risk factors addressed and post discharge goals established will improve Outcome: Progressing Goal: Individualized Educational Video(s) Outcome: Progressing   Problem: Coping: Goal: Will verbalize positive feelings about self Outcome: Progressing Goal: Will identify appropriate support needs Outcome: Progressing   Problem: Health Behavior/Discharge Planning: Goal: Ability to manage health-related needs will improve Outcome: Progressing   Problem: Self-Care: Goal: Ability to participate in self-care as condition permits will improve Outcome: Progressing Goal: Verbalization of feelings and concerns over difficulty with self-care will improve Outcome: Progressing Goal: Ability to communicate needs accurately will improve Outcome: Progressing   Problem: Nutrition: Goal: Risk of aspiration will decrease Outcome: Progressing Goal: Dietary intake will improve Outcome: Progressing   Problem: Ischemic Stroke/TIA Tissue Perfusion: Goal: Complications of ischemic stroke/TIA will be minimized Outcome: Progressing   Problem: Education: Goal: Utilization of techniques to improve thought processes will improve Outcome: Progressing Goal: Knowledge of the prescribed therapeutic regimen will improve Outcome: Progressing   Problem: Activity: Goal: Interest or engagement in leisure activities will  improve Outcome: Progressing Goal: Imbalance in normal sleep/wake cycle will improve Outcome: Progressing   Problem: Coping: Goal: Coping ability will improve Outcome: Progressing Goal: Will verbalize feelings Outcome: Progressing   Problem: Health Behavior/Discharge Planning: Goal: Ability to make decisions will improve Outcome: Progressing Goal: Compliance with therapeutic regimen will improve Outcome: Progressing   Problem: Role Relationship: Goal: Will demonstrate positive changes in social behaviors and relationships Outcome: Progressing   Problem: Safety: Goal: Ability to disclose and discuss suicidal ideas will improve Outcome: Progressing Goal: Ability to identify and utilize support systems that promote safety will improve Outcome: Progressing   Problem: Self-Concept: Goal: Will verbalize positive feelings about self Outcome: Progressing Goal: Level of anxiety will decrease Outcome: Progressing   Problem: Education: Goal: Ability to state activities that reduce stress will improve Outcome: Progressing   Problem: Coping: Goal: Ability to identify and develop effective coping behavior will improve Outcome: Progressing   Problem: Self-Concept: Goal: Ability to identify factors that promote anxiety will improve Outcome: Progressing Goal: Level of anxiety will decrease Outcome: Progressing Goal: Ability to modify response to factors that promote anxiety will improve Outcome: Progressing   Problem: Education: Goal: Ability to describe self-care measures that may prevent or decrease complications (Diabetes Survival Skills Education) will improve Outcome: Progressing Goal: Individualized Educational Video(s) Outcome: Progressing   Problem: Coping: Goal: Ability to adjust to condition or change in health will improve Outcome: Progressing   Problem: Fluid Volume: Goal: Ability to maintain a balanced intake and output will improve Outcome: Progressing    Problem: Health Behavior/Discharge Planning: Goal: Ability to identify and utilize available resources and services will improve Outcome: Progressing Goal: Ability to manage health-related needs will improve Outcome: Progressing   Problem: Metabolic: Goal: Ability to maintain appropriate glucose levels will improve Outcome: Progressing   Problem: Nutritional: Goal: Maintenance of adequate nutrition will improve Outcome: Progressing Goal: Progress toward achieving an optimal weight will improve Outcome: Progressing   Problem: Skin Integrity: Goal: Risk for impaired skin integrity will decrease Outcome: Progressing  Problem: Tissue Perfusion: Goal: Adequacy of tissue perfusion will improve Outcome: Progressing

## 2020-09-15 ENCOUNTER — Inpatient Hospital Stay (HOSPITAL_COMMUNITY): Payer: Medicare Other | Admitting: Anesthesiology

## 2020-09-15 ENCOUNTER — Encounter (HOSPITAL_COMMUNITY)
Admission: EM | Disposition: A | Discharge status: Skilled Nursing Facility | Payer: Self-pay | Source: Home / Self Care | Attending: Family Medicine

## 2020-09-15 ENCOUNTER — Other Ambulatory Visit: Payer: Self-pay

## 2020-09-15 ENCOUNTER — Encounter (HOSPITAL_COMMUNITY): Payer: Self-pay | Admitting: Internal Medicine

## 2020-09-15 DIAGNOSIS — A419 Sepsis, unspecified organism: Secondary | ICD-10-CM | POA: Diagnosis not present

## 2020-09-15 DIAGNOSIS — R6520 Severe sepsis without septic shock: Secondary | ICD-10-CM | POA: Diagnosis not present

## 2020-09-15 DIAGNOSIS — C3492 Malignant neoplasm of unspecified part of left bronchus or lung: Secondary | ICD-10-CM | POA: Diagnosis not present

## 2020-09-15 HISTORY — PX: VIDEO BRONCHOSCOPY WITH ENDOBRONCHIAL ULTRASOUND: SHX6177

## 2020-09-15 HISTORY — PX: BRONCHIAL NEEDLE ASPIRATION BIOPSY: SHX5106

## 2020-09-15 LAB — GLUCOSE, CAPILLARY
Glucose-Capillary: 146 mg/dL — ABNORMAL HIGH (ref 70–99)
Glucose-Capillary: 150 mg/dL — ABNORMAL HIGH (ref 70–99)
Glucose-Capillary: 147 mg/dL — ABNORMAL HIGH (ref 70–99)
Glucose-Capillary: 221 mg/dL — ABNORMAL HIGH (ref 70–99)

## 2020-09-15 SURGERY — BRONCHOSCOPY, WITH EBUS
Anesthesia: General

## 2020-09-15 MED ORDER — ROCURONIUM BROMIDE 10 MG/ML (PF) SYRINGE
PREFILLED_SYRINGE | INTRAVENOUS | Status: DC | PRN
  Administered 2020-09-15: 10 mg via INTRAVENOUS
  Administered 2020-09-15: 40 mg via INTRAVENOUS

## 2020-09-15 MED ORDER — LIDOCAINE 2% (20 MG/ML) 5 ML SYRINGE
INTRAMUSCULAR | Status: DC | PRN
  Administered 2020-09-15: 60 mg via INTRAVENOUS

## 2020-09-15 MED ORDER — EPHEDRINE SULFATE-NACL 50-0.9 MG/10ML-% IV SOSY
PREFILLED_SYRINGE | INTRAVENOUS | Status: DC | PRN
  Administered 2020-09-15 (×5): 10 mg via INTRAVENOUS
  Administered 2020-09-15: 20 mg via INTRAVENOUS

## 2020-09-15 MED ORDER — DEXAMETHASONE SODIUM PHOSPHATE 10 MG/ML IJ SOLN
INTRAMUSCULAR | Status: DC | PRN
  Administered 2020-09-15: 5 mg via INTRAVENOUS

## 2020-09-15 MED ORDER — SUGAMMADEX SODIUM 200 MG/2ML IV SOLN
INTRAVENOUS | Status: DC | PRN
  Administered 2020-09-15: 200 mg via INTRAVENOUS

## 2020-09-15 MED ORDER — LACTATED RINGERS IV SOLN: INTRAVENOUS | Status: DC

## 2020-09-15 MED ORDER — PHENYLEPHRINE HCL-NACL 10-0.9 MG/250ML-% IV SOLN
INTRAVENOUS | Status: DC | PRN
  Administered 2020-09-15: 50 ug/min via INTRAVENOUS

## 2020-09-15 MED ORDER — ONDANSETRON HCL 4 MG/2ML IJ SOLN
INTRAMUSCULAR | Status: DC | PRN
  Administered 2020-09-15: 4 mg via INTRAVENOUS

## 2020-09-15 MED ORDER — CHLORHEXIDINE GLUCONATE 0.12 % MT SOLN: 15.0000 mL | Freq: Once | OROMUCOSAL | Status: AC

## 2020-09-15 MED ORDER — PHENYLEPHRINE 40 MCG/ML (10ML) SYRINGE FOR IV PUSH (FOR BLOOD PRESSURE SUPPORT)
PREFILLED_SYRINGE | INTRAVENOUS | Status: DC | PRN
  Administered 2020-09-15: 120 ug via INTRAVENOUS
  Administered 2020-09-15: 80 ug via INTRAVENOUS
  Administered 2020-09-15: 20 ug via INTRAVENOUS
  Administered 2020-09-15: 80 ug via INTRAVENOUS

## 2020-09-15 MED ORDER — PROPOFOL 10 MG/ML IV BOLUS
INTRAVENOUS | Status: DC | PRN
  Administered 2020-09-15: 70 mg via INTRAVENOUS

## 2020-09-15 MED ORDER — FENTANYL CITRATE (PF) 250 MCG/5ML IJ SOLN
INTRAMUSCULAR | Status: DC | PRN
  Administered 2020-09-15: 25 ug via INTRAVENOUS

## 2020-09-15 MED ORDER — CHLORHEXIDINE GLUCONATE 0.12 % MT SOLN
OROMUCOSAL | Status: AC
  Administered 2020-09-15: 15 mL
  Filled 2020-09-15: qty 15

## 2020-09-15 SURGICAL SUPPLY — 30 items

## 2020-09-15 NOTE — Anesthesia Preprocedure Evaluation (Addendum)
Anesthesia Evaluation  Patient identified by MRN, date of birth, ID band Patient awake    Reviewed: Allergy & Precautions, H&P , NPO status , Patient's Chart, lab work & pertinent test results, reviewed documented beta blocker date and time   Airway Mallampati: II  TM Distance: <3 FB Neck ROM: Full    Dental no notable dental hx. (+) Edentulous Upper, Edentulous Lower   Pulmonary  Lung mass   Pulmonary exam normal breath sounds clear to auscultation       Cardiovascular hypertension, Pt. on home beta blockers Normal cardiovascular exam+ dysrhythmias Atrial Fibrillation  Rhythm:Irregular Rate:Normal     Neuro/Psych PSYCHIATRIC DISORDERS Anxiety Depression TIA   GI/Hepatic negative GI ROS, Neg liver ROS,   Endo/Other  diabetes, Type 2, Oral Hypoglycemic AgentsHypothyroidism Morbid obesityObesity   Renal/GU negative Renal ROS  negative genitourinary   Musculoskeletal  (+) Arthritis ,   Abdominal   Peds negative pediatric ROS (+)  Hematology  (+) Blood dyscrasia, anemia ,   Anesthesia Other Findings   Reproductive/Obstetrics negative OB ROS                            Anesthesia Physical  Anesthesia Plan  ASA: IV  Anesthesia Plan: General   Post-op Pain Management:    Induction: Intravenous  PONV Risk Score and Plan: 3 and Ondansetron, Dexamethasone and Treatment may vary due to age or medical condition  Airway Management Planned: Oral ETT  Additional Equipment:   Intra-op Plan:   Post-operative Plan: Extubation in OR  Informed Consent: I have reviewed the patients History and Physical, chart, labs and discussed the procedure including the risks, benefits and alternatives for the proposed anesthesia with the patient or authorized representative who has indicated his/her understanding and acceptance.   Patient has DNR.  Discussed DNR with patient and Suspend DNR.   Dental  advisory given  Plan Discussed with: CRNA and Anesthesiologist  Anesthesia Plan Comments:        Anesthesia Quick Evaluation

## 2020-09-15 NOTE — Interval H&P Note (Signed)
History and Physical Interval Note:  09/15/2020 2:21 PM  Ashlee Greer  has presented today for surgery, with the diagnosis of LUNG MASS.  The various methods of treatment have been discussed with the patient and family. After consideration of risks, benefits and other options for treatment, the patient has consented to  Procedure(s): [place] (N/A) as a surgical intervention.  The patient's history has been reviewed, patient examined, no change in status, stable for surgery.  I have reviewed the patient's chart and labs.  Questions were answered to the patient's satisfaction.     [place]

## 2020-09-15 NOTE — Anesthesia Procedure Notes (Signed)
Procedure Name: Intubation Date/Time: 09/15/2020 2:39 PM Performed by: Thelma Comp, CRNA Pre-anesthesia Checklist: Patient identified, Emergency Drugs available, Suction available and Patient being monitored Patient Re-evaluated:Patient Re-evaluated prior to induction Oxygen Delivery Method: Circle System Utilized Preoxygenation: Pre-oxygenation with 100% oxygen Induction Type: IV induction Ventilation: Mask ventilation without difficulty Laryngoscope Size: Mac and 3 Grade View: Grade I Tube type: Oral Tube size: 8.5 mm Number of attempts: 1 Airway Equipment and Method: Stylet and Oral airway Placement Confirmation: ETT inserted through vocal cords under direct vision,  positive ETCO2 and breath sounds checked- equal and bilateral Secured at: 22 cm Tube secured with: Tape Dental Injury: Teeth and Oropharynx as per pre-operative assessment

## 2020-09-15 NOTE — Progress Notes (Signed)
PROGRESS NOTE    Ashlee Greer  WPY:[id number] DOB: 1933-07-10 DOA: 08/30/2020 PCP: [name], MD  Chief Complaint  Patient presents with  . Loss of Consciousness   Brief Narrative:  Ashlee Greer a 87 y.o.femalewith medical history significant ofA. fib not on anticoagulation, anxiety/depression, arthritis, diabetes, glaucoma, hypertension, hypothyroidism who presented with recurrent loss of consciousness secondary to orthostatic hypotension.  Patient was recently discharged from [facility] after treatment for TIA, UTI, pneumonia. She also had some fatigue and orthostasis while she was there. She was discharged on Bactrim for UTI and Levaquin for her pneumonia. She was recommended to go to an SNF at discharge but family is an Therapist, sports and stated they would care for patient at home.  However, her BP remained low prior to discharge which concerned family.  Due to ongoing ortho hypoTN on returning home, family brought patient to the hospital.    Workup in the ER was notable for temp 101.4, tachypnea, tachycardia. CXR showed left basilar infiltrate vs atelectasis.  Lactic was elevated and she was started on IVF and cefepime. Covid and flu swab negative.  She continued to improve with antibiotics. She was evaluated by physical therapy and recommended for SNF rehab at discharge.  This was discussed with her family who was also in agreement.  Assessment & Plan:   Active Problems:   History of TIA (transient ischemic attack)   PAF (paroxysmal atrial fibrillation) (HCC)   Anxiety and depression   Diabetes (HCC)   Hypothyroidism   Hand pain, right  Severe sepsis 2/2 Community Acquired Pneumonia (HCC)-resolved as of 09/04/2020 - now clinically improved on IV abx; - follow up cultures: negative to date - completed course  3.8 x 3.5 cm L hilar Mass      - she's completed course of abx as noted above      - s/p flexible bronch and EBUS bronchoscopy 1/1.  Pathology  showed all necrotic tissue.  PCCM has a scheduled her for bronchoscopy today.  They also talk to the patient's daughter and answered several questions.        Acute Metabolic Encephalopathy       - she was independent ~2 weeks ago at home and has daily decline       - MRI at 12/16 [facility] was negative for acute events       - head CT without acute findings       - MRI brain (no acute intracranial abnormality, moderate chronic small vessel ischemic changes and generalized volume loss) and EEG (mild diffuse encephalopathy, nonspecific)       - TSH wnl,  ammonia (wnl), B12 (wnl), RPR (nonreactive)       - appreciate neurology, concern for mild to moderate hypoactive catatonia -> trial of ambien.  Patient alert and oriented however slow in response.  Lower Extremity Weakness            - appreciate neurology recs ->MRI with mild central canal stenosis and mild to oderate foraminal narrowing at T9-10, moderate L foraminal narrowing T10-11.  Convex L scoliosis.  Degenerative disc disease at L1-2 results in narrowing in R subarticular recess (could impact descending R L2 root).  Moderate R foralminal narrowing and narrowing in R subarticular recess at L2-3 could impact descending R L3 and exiting R L2 roots.  Moderate central canal stenosis and L worse than R subarticular recess narrowing at L3-4.  Bilateral foraminal narrowing at this level.  Neurology had opined that her  weakness in the right lower extremity is likely secondary to foraminal narrowing.  Orthostatic hypotension-resolved as of 09/02/2020 -Also possibly multifactorial in setting of infection as well as polypharmacy.  Her medications reviewed and she is on sedating medications including clonazepam as well as anticholinergic mechanisms with nortriptyline which were held; Previous MD has also counseled her bedside that her clonazepam dose needs to be reduced at discharge and ideally would benefit from weaning off of it altogether.  This has  been weaned off and stopped on 09/13/2020. -Continue celexa at lower dose, 20 mg daily on 12/25 -Continue monitoring blood pressure which is improving -Repeat orthostatic blood pressure: negative on 12/24  History of Fall       - occurred prior to [place] hospitalization       - ? If related to R wrist pain (see below).  She has neck pain, will get cervical imaging (no fracture or subluxation)      - she's complaining of pain limiting lower extremity strength today -> bilateral hip and R knee film without acute finding (remote proximal fibula fx, tricompartment OA and chondrocalcinosis)  History of TIA (transient ischemic attack) - see afib - never received records from [facility] and depression -See orthostatic hypotension Discontinued Klonopin on 09/13/2020. -Celexa continued.  PAF (paroxysmal atrial fibrillation) (HCC) Continue Coreg.  Xarelto is on hold for pending bronchoscopy and biopsy today.  Acute lower UTI-resolved as of 09/04/2020 -She does endorse some symptoms.  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  Continue SSI and CBG monitoring - follow A1c (6.8)  DVT prophylaxis: xarelto Code Status: partial (no CPR, ok with intubation) Family Communication: No family present today.  Discussed with daughter over the phone yesterday.  Discussed with patient today. Disposition:   Status is: Inpatient  Remains inpatient appropriate because:Inpatient level of care appropriate due to severity of illness   Dispo: The patient is from: Home              Anticipated d/c is to: SNF              Anticipated d/c  date is: 1 to 2 days if cleared by PCCM              Patient currently is medically stable to d/c.  Consultants:   pulm  Procedures:  As above  Antimicrobials:  Anti-infectives (From admission, onward)   Start     Dose/Rate Route Frequency Ordered Stop   09/02/20 1215  azithromycin (ZITHROMAX) tablet 500 mg        500 mg Oral Daily 09/02/20 1122 09/07/20 1055   08/31/20 1615  metroNIDAZOLE (FLAGYL) tablet 500 mg  Status:  Discontinued        500 mg Oral Every 8 hours 08/31/20 1608 09/06/20 1654   08/30/20 2100  ceFEPIme (MAXIPIME) 2 g in sodium chloride 0.9 % 100 mL IVPB  Status:  Discontinued        2 g 200 mL/hr over 30 Minutes Intravenous Every 12 hours 08/30/20 2011 09/02/20 1122     Subjective: Patient seen and examined.  She is alert and oriented with no complaints.  Objective: Vitals:   09/14/20 1304 09/14/20 1647 09/14/20 2156 09/15/20 0432  BP: (!) 117/57 (!) 141/88 135/78 140/73  Pulse: 90 85 81 88  Resp: 18 18 17 17   Temp: 97.6 F (36.4 C) 98.9 F (37.2 C) (!) 97.5 F (36.4 C) 98.1 F (36.7 C)  TempSrc: Oral Oral    SpO2: 96% 93% 96% 93%  Weight:      Height:        Intake/Output Summary (Last 24 hours) at 09/15/2020 1134 Last data filed at 09/15/2020 0538 Gross per 24 hour  Intake 30 ml  Output 850 ml  Net -820 ml   Filed Weights   08/30/20 1748  Weight: 88.5 kg    Examination:  General exam: Appears calm and comfortable  Respiratory system: Clear to auscultation. Respiratory effort normal. Cardiovascular system: S1 & S2 heard, RRR. No JVD, murmurs, rubs, gallops or clicks. No pedal edema. Gastrointestinal system: Abdomen is nondistended, soft and nontender. No organomegaly or masses felt. Normal bowel sounds heard. Central nervous system: Alert and oriented. No focal neurological deficits. Extremities: Symmetric 5 x 5 power. Skin: No rashes, lesions or ulcers.   Data Reviewed: I have personally reviewed following labs and imaging  studies  CBC: Recent Labs  Lab 09/08/20 1437 09/09/20 0238 09/10/20 0139 09/11/20 0246  WBC 13.8* 13.1* 10.7* 8.8  NEUTROABS 12.0* 10.6* 9.5* 6.4  HGB 11.5* 11.3* 10.4* 10.5*  HCT 35.8* 35.4* 32.9* 31.6*  MCV 93.0 92.7 94.0 92.9  PLT 339 340 320 188    Basic Metabolic Panel: Recent Labs  Lab 09/09/20 0238 09/10/20 0139 09/11/20 0246 09/12/20 0127  NA 134* 135 134* 136  K 4.5 5.1 4.6 5.1  CL 100 100 99 98  CO2 24 24 24 24   GLUCOSE 157* 190* 120* 125*  BUN 19 25* 27* 22  CREATININE 0.71 0.72 0.77 0.70  CALCIUM 8.5* 8.6* 8.5* 8.6*  MG 2.1 2.1 2.0 1.9  PHOS 3.2 3.9 3.3 4.3    GFR: Estimated Creatinine Clearance: 52 mL/min (by C-G formula based on SCr of 0.7 mg/dL).  Liver Function Tests: Recent Labs  Lab 09/09/20 0238 09/10/20 0139 09/11/20 0246 09/12/20 0127  AST 68* 47* 75* 58*  ALT 37 35 52* 51*  ALKPHOS 81 70 68 71  BILITOT 0.6 0.6 0.6 0.5  PROT 6.4* 6.1* 6.3* 6.0*  ALBUMIN 1.8* 1.7* 1.8* 2.0*    CBG: Recent Labs  Lab 09/14/20 0803 09/14/20 1135 09/14/20 1654 09/14/20 2154 09/15/20 0813  GLUCAP 149* 162* 122* 140* 146*     Recent Results (from the past 240 hour(s))  Resp Panel by RT-PCR (Flu A&B, Covid) Nasopharyngeal Swab     Status: None   Collection Time: 09/05/20  4:47 PM   Specimen: Nasopharyngeal Swab; Nasopharyngeal(NP) swabs in vial transport medium  Result Value Ref Range Status   SARS Coronavirus 2 by RT PCR NEGATIVE NEGATIVE Final    Comment: (NOTE) SARS-CoV-2 target nucleic acids are NOT DETECTED.  The SARS-CoV-2 RNA is generally detectable in upper respiratory specimens during the acute phase of infection. The lowest concentration of SARS-CoV-2 viral copies this assay can detect is 138 copies/mL. A negative result does not preclude SARS-Cov-2 infection and should not be used as the sole basis for treatment or other patient management decisions. A negative result may occur with  improper specimen collection/handling,  submission of specimen other than nasopharyngeal swab, presence of viral mutation(s) within the areas targeted by this assay, and inadequate number of viral copies(<138 copies/mL). A  negative result must be combined with clinical observations, patient history, and epidemiological information. The expected result is Negative.  Fact Sheet for Patients:  EntrepreneurPulse.com.au  Fact Sheet for Healthcare Providers:  IncredibleEmployment.be  This test is no t yet approved or cleared by the [place] FDA and  has been authorized for detection and/or diagnosis of SARS-CoV-2 by FDA under an Emergency Use Authorization (EUA). This EUA will remain  in effect (meaning this test can be used) for the duration of the COVID-19 declaration under Section 564(b)(1) of the Act, 21 U.S.C.section 360bbb-3(b)(1), unless the authorization is terminated  or revoked sooner.       Influenza A by PCR NEGATIVE NEGATIVE Final   Influenza B by PCR NEGATIVE NEGATIVE Final    Comment: (NOTE) The Xpert Xpress SARS-CoV-2/FLU/RSV plus assay is intended as an aid in the diagnosis of influenza from Nasopharyngeal swab specimens and should not be used as a sole basis for treatment. Nasal washings and aspirates are unacceptable for Xpert Xpress SARS-CoV-2/FLU/RSV testing.  Fact Sheet for Patients: EntrepreneurPulse.com.au  Fact Sheet for Healthcare Providers: IncredibleEmployment.be  This test is not yet approved or cleared by the [place] FDA and has been authorized for detection and/or diagnosis of SARS-CoV-2 by FDA under an Emergency Use Authorization (EUA). This EUA will remain in effect (meaning this test can be used) for the duration of the COVID-19 declaration under Section 564(b)(1) of the Act, 21 U.S.C. section 360bbb-3(b)(1), unless the authorization is terminated or revoked.  Performed at [facility], [place] [address] [place], [place] [address]   Resp Panel by RT-PCR (Flu A&B, Covid) Nasopharyngeal Swab     Status: None   Collection Time: 09/11/20  2:07 PM   Specimen: Nasopharyngeal Swab; Nasopharyngeal(NP) swabs in vial transport medium  Result Value Ref Range Status   SARS Coronavirus 2 by RT PCR NEGATIVE NEGATIVE Final    Comment: (NOTE) SARS-CoV-2 target nucleic acids are NOT DETECTED.  The SARS-CoV-2 RNA is generally detectable in upper respiratory specimens during the acute phase of infection. The lowest concentration of SARS-CoV-2 viral copies this assay can detect is 138 copies/mL. A negative result does not preclude SARS-Cov-2 infection and should not be used as the sole basis for treatment or other patient management decisions. A negative result may occur with  improper specimen collection/handling, submission of specimen other than nasopharyngeal swab, presence of viral mutation(s) within the areas targeted by this assay, and inadequate number of viral copies(<138 copies/mL). A negative result must be combined with clinical observations, patient history, and epidemiological information. The expected result is Negative.  Fact Sheet for Patients:  EntrepreneurPulse.com.au  Fact Sheet for Healthcare Providers:  IncredibleEmployment.be  This test is no t yet approved or cleared by the [place] FDA and  has been authorized for detection and/or diagnosis of SARS-CoV-2 by FDA under an Emergency Use Authorization (EUA). This EUA will remain  in effect (meaning this test can be used) for the duration of the COVID-19 declaration under Section 564(b)(1) of the Act, 21 U.S.C.section 360bbb-3(b)(1), unless the authorization is terminated  or revoked sooner.       Influenza A by PCR NEGATIVE NEGATIVE Final   Influenza B by PCR NEGATIVE NEGATIVE Final    Comment: (NOTE) The Xpert Xpress SARS-CoV-2/FLU/RSV plus assay is intended as an aid in the  diagnosis of influenza from Nasopharyngeal swab specimens and should not be used as a sole basis for treatment. Nasal washings and aspirates are unacceptable for Xpert Xpress SARS-CoV-2/FLU/RSV  testing.  Fact Sheet for Patients: EntrepreneurPulse.com.au  Fact Sheet for Healthcare Providers: IncredibleEmployment.be  This test is not yet approved or cleared by the [place] FDA and has been authorized for detection and/or diagnosis of SARS-CoV-2 by FDA under an Emergency Use Authorization (EUA). This EUA will remain in effect (meaning this test can be used) for the duration of the COVID-19 declaration under Section 564(b)(1) of the Act, 21 U.S.C. section 360bbb-3(b)(1), unless the authorization is terminated or revoked.  Performed at [facility], [place] [address] [place], [place] [address]          Radiology Studies: No results found.      Scheduled Meds: . carvedilol  3.125 mg Oral BID WC  . citalopram  20 mg Oral QHS  . donepezil  10 mg Oral QHS  . insulin aspart  0-15 Units Subcutaneous TID WC  . latanoprost  1 drop Both Eyes QHS  . levothyroxine  50 mcg Oral QAC breakfast  . pantoprazole  40 mg Oral Daily  . sodium chloride flush  3 mL Intravenous Q12H   Continuous Infusions:   LOS: 15 days    Time spent: over 28 min  Darliss Cheney, MD [facility]  To contact the attending provider between 7A-7P or the covering provider during after hours 7P-7A, please log into the web site [email or web address] and access using universal [place] password for that web site. If you do not have the password, please call the hospital operator.  09/15/2020, 11:34 AM

## 2020-09-15 NOTE — Op Note (Signed)
Video Bronchoscopy with Endobronchial Ultrasound Procedure Note  Date of Operation: 09/15/2020  Pre-op Diagnosis: Left hilar mass  Post-op Diagnosis: Left hilar mass  Surgeon: Garner Nash, DO   Assistants: none   Anesthesia: General endotracheal anesthesia  Operation: Flexible video fiberoptic bronchoscopy with endobronchial ultrasound and biopsies.  Estimated Blood Loss: Minimal  Complications: None   Indications and History: Ashlee Greer is a 87 y.o. female with left hilar mass.  The risks, benefits, complications, treatment options and expected outcomes were discussed with the patient.  The possibilities of pneumothorax, pneumonia, reaction to medication, pulmonary aspiration, perforation of a viscus, bleeding, failure to diagnose a condition and creating a complication requiring transfusion or operation were discussed with the patient who freely signed the consent.    Description of Procedure: The patient was examined in the preoperative area and history and data from the preprocedure consultation were reviewed. It was deemed appropriate to proceed.  The patient was taken to [facility] endoscopy room 2, identified as Ashlee Greer and the procedure verified as Flexible Video Fiberoptic Bronchoscopy.  A Time Out was held and the above information confirmed. After being taken to the operating room general anesthesia was initiated and the patient  was orally intubated. The video fiberoptic bronchoscope was introduced via the endotracheal tube and a general inspection was performed which showed normal right and left lung anatomy, there was abnormal mucosa along the medial portion of the left mainstem wall concerning for submucosal tumor infiltration.. The standard scope was then withdrawn and the endobronchial ultrasound was used to identify and characterize the peritracheal, hilar and bronchial lymph nodes. Inspection showed large 3.5 cm cross-section left hilar mass visible along the distal  portion of the left mainstem medial wall.. Using real-time ultrasound guidance Wang needle biopsies were take from left hilar mass nodes and were sent for cytology.  Numerous passes were taken for building of the cell block and slides were sent down to Dr. Saralyn Pilar in the room for confirmation of malignancy. The patient tolerated the procedure well without apparent complications.  The endobronchial ultrasound scope was swapped out for a standard therapeutic bronchoscope.  Aspiration of the bilateral mainstem's was necessary for removal of clot, secretions and debris.  Blood clot from the left lower lobe was extracted.  And all distal subsegments were patent at the termination of the procedure.  The bronchoscope was brought to just above the main carina and there was no evidence of active bleeding.  There was no significant blood loss. The bronchoscope was withdrawn. Anesthesia was reversed and the patient was taken to the PACU for recovery.   Samples: 1. Wang needle biopsies from left hilar mass  Plans:  The patient will be discharged from the PACU to home when recovered from anesthesia. We will review the cytology, pathology and microbiology results with the patient when they become available. Outpatient followup will be with Garner Nash, DO.   Garner Nash, DO Post Pulmonary Critical Care 09/15/2020 3:46 PM

## 2020-09-15 NOTE — Transfer of Care (Signed)
Immediate Anesthesia Transfer of Care Note  Patient: Ashlee Greer  Procedure(s) Performed: VIDEO BRONCHOSCOPY WITH ENDOBRONCHIAL ULTRASOUND (N/A ) BRONCHIAL NEEDLE ASPIRATION BIOPSIES  Patient Location: Short Stay  Anesthesia Type:General  Level of Consciousness: drowsy, patient cooperative and responds to stimulation  Airway & Oxygen Therapy: Patient Spontanous Breathing and Patient connected to face mask oxygen  Post-op Assessment: Report given to RN and Post -op Vital signs reviewed and stable  Post vital signs: Reviewed and stable  Last Vitals:  Vitals Value Taken Time  BP 116/58 09/15/20 1557  Temp    Pulse 89 09/15/20 1559  Resp 17 09/15/20 1559  SpO2 93 % 09/15/20 1559  Vitals shown include unvalidated device data.  Last Pain:  Vitals:   09/15/20 1557  TempSrc: Temporal  PainSc: 0-No pain      Patients Stated Pain Goal: 0 (94/80/16 5537)  Complications: No complications documented.

## 2020-09-16 ENCOUNTER — Encounter (HOSPITAL_COMMUNITY): Payer: Self-pay | Admitting: Pulmonary Disease

## 2020-09-16 DIAGNOSIS — A419 Sepsis, unspecified organism: Secondary | ICD-10-CM | POA: Diagnosis not present

## 2020-09-16 DIAGNOSIS — R6520 Severe sepsis without septic shock: Secondary | ICD-10-CM | POA: Diagnosis not present

## 2020-09-16 LAB — CBC WITH DIFFERENTIAL/PLATELET
Abs Immature Granulocytes: 0.03 10*3/uL (ref 0.00–0.07)
Basophils Absolute: 0 10*3/uL (ref 0.0–0.1)
Basophils Relative: 0 %
Eosinophils Absolute: 0 10*3/uL (ref 0.0–0.5)
Eosinophils Relative: 0 %
HCT: 35.7 % — ABNORMAL LOW (ref 36.0–46.0)
Hemoglobin: 11.8 g/dL — ABNORMAL LOW (ref 12.0–15.0)
Immature Granulocytes: 1 %
Lymphocytes Relative: 17 %
Lymphs Abs: 1 10*3/uL (ref 0.7–4.0)
MCH: 31 pg (ref 26.0–34.0)
MCHC: 33.1 g/dL (ref 30.0–36.0)
MCV: 93.7 fL (ref 80.0–100.0)
Monocytes Absolute: 0.3 10*3/uL (ref 0.1–1.0)
Monocytes Relative: 5 %
Neutro Abs: 4.4 10*3/uL (ref 1.7–7.7)
Neutrophils Relative %: 77 %
Platelets: 371 10*3/uL (ref 150–400)
RBC: 3.81 MIL/uL — ABNORMAL LOW (ref 3.87–5.11)
RDW: 15.5 % (ref 11.5–15.5)
WBC: 5.7 10*3/uL (ref 4.0–10.5)
nRBC: 0 % (ref 0.0–0.2)

## 2020-09-16 LAB — BASIC METABOLIC PANEL
Anion gap: 10 (ref 5–15)
BUN: 16 mg/dL (ref 8–23)
CO2: 23 mmol/L (ref 22–32)
Calcium: 8.8 mg/dL — ABNORMAL LOW (ref 8.9–10.3)
Chloride: 101 mmol/L (ref 98–111)
Creatinine, Ser: 0.72 mg/dL (ref 0.44–1.00)
GFR, Estimated: >60 mL/min (ref >60)
Glucose, Bld: 174 mg/dL — ABNORMAL HIGH (ref 70–99)
Potassium: 4.8 mmol/L (ref 3.5–5.1)
Sodium: 134 mmol/L — ABNORMAL LOW (ref 135–145)

## 2020-09-16 LAB — GLUCOSE, CAPILLARY
Glucose-Capillary: 148 mg/dL — ABNORMAL HIGH (ref 70–99)
Glucose-Capillary: 142 mg/dL — ABNORMAL HIGH (ref 70–99)
Glucose-Capillary: 118 mg/dL — ABNORMAL HIGH (ref 70–99)
Glucose-Capillary: 140 mg/dL — ABNORMAL HIGH (ref 70–99)

## 2020-09-16 LAB — SARS CORONAVIRUS 2 (TAT 6-24 HRS): SARS Coronavirus 2: NEGATIVE

## 2020-09-16 MED ORDER — RIVAROXABAN 20 MG PO TABS
20.0000 mg | ORAL_TABLET | Freq: Every day | ORAL | Status: DC
  Administered 2020-09-16: 20 mg via ORAL
  Filled 2020-09-16: qty 1

## 2020-09-16 NOTE — Progress Notes (Signed)
Report given to Lattie Haw, Therapist, sports at [employer], [employer]

## 2020-09-16 NOTE — TOC Progression Note (Signed)
Transition of Care [facility]) - Progression Note    Patient Details  Name: Ashlee Greer MRN: [id number] Date of Birth: 04-20-1933  Transition of Care [facility]) CM/SW [place], [place] Phone Number: [phone] 09/16/2020, 4:28 PM  Clinical Narrative:    Due to COVID test pending, discharge has been rescheduled for tomorrow.  RN and MD alerted of discharge changed plans.  TOC team will continue to assist with discharge planning needs.   Expected Discharge Plan: [place] Barriers to Discharge: No Barriers Identified  Expected Discharge Plan and Services Expected Discharge Plan: [place]   Discharge Planning Services: CM Consult Post Acute Care Choice: Durable Medical Equipment Living arrangements for the past 2 months: Single Family Home Expected Discharge Date: 09/16/20                                     Social Determinants of Health (SDOH) Interventions    Readmission Risk Interventions No flowsheet data found.

## 2020-09-16 NOTE — Anesthesia Postprocedure Evaluation (Signed)
Anesthesia Post Note  Patient: Ashlee Greer  Procedure(s) Performed: VIDEO BRONCHOSCOPY WITH ENDOBRONCHIAL ULTRASOUND (N/A ) BRONCHIAL NEEDLE ASPIRATION BIOPSIES     Patient location during evaluation: PACU Anesthesia Type: General Level of consciousness: sedated and patient cooperative Pain management: pain level controlled Vital Signs Assessment: post-procedure vital signs reviewed and stable Respiratory status: spontaneous breathing Cardiovascular status: stable Anesthetic complications: no   No complications documented.  Last Vitals:  Vitals:   09/15/20 2104 09/16/20 0333  BP: 122/66 134/76  Pulse: 85 75  Resp: 16 16  Temp: (!) 36.4 C 36.9 C  SpO2: 97% 96%    Last Pain:  Vitals:   09/16/20 0827  TempSrc:   PainSc: [place]

## 2020-09-16 NOTE — Discharge Summary (Addendum)
Physician Discharge Summary  Ashlee Greer:[id number] DOB: 02/10/1933 DOA: 08/30/2020  PCP: [name], MD  Admit date: 08/30/2020 Discharge date: 09/16/2020  Time spent: 40 minutes  Recommendations for Outpatient Follow-up:  1. Follow outpatient CBC/CMP 2. Follow with pulmonology and they will arrange PET scan for you. 3. Follow-up with oncology. 4. Follow outpatient for left hilar mass with pulmonology/oncology 5. Encephalopathy improved - follow with neurology outpatient for TIA, encephalopathy, and catatonia. Avoid antipsychotics.   6. Follow with spine surgery for stenosis 7. Needs repeat x ray to right wrist around 09/17/2020 8. Started on xarelto for atrial fibrillation - follow outpatient for tolerance  Please review pending cytology prior to discharge. Discharge Diagnoses:  Active Problems:   History of TIA (transient ischemic attack)   PAF (paroxysmal atrial fibrillation) (HCC)   Anxiety and depression   Diabetes ([place])   Hypothyroidism   Hand pain, right   NSCLC of left lung ([place])   Discharge Condition: stable  Diet recommendation: carb modified  Filed Weights   08/30/20 1748 09/15/20 1305  Weight: 88.5 kg 88.4 kg    History of present illness:  Ashlee Greer Allredis a 87 y.o.femalewith medical history significant ofA. fib not on anticoagulation, anxiety/depression, arthritis, diabetes, glaucoma, hypertension, hypothyroidism who presented with recurrent loss of consciousness secondary to orthostatic hypotension.  Patient was recently discharged from [facility] after treatment for TIA, UTI, pneumonia. She also had some fatigue and orthostasis while she was there. She was discharged on Bactrim for UTI and Levaquin for her pneumonia. She was recommended to go to an SNF at discharge but family is an Therapist, sports and stated they would care for patient at home.  However, her BP remained low prior to discharge which concerned family.  Due to ongoing ortho hypoTN  on returning home, family brought patient to the hospital.   Workup in the ER was notable for temp 101.4, tachypnea, tachycardia. CXR showed left basilar infiltrate vs atelectasis.  Lactic was elevated and she was started on IVF and cefepime. Covid and flu swab negative.  She continued to improve with antibiotics. She was evaluated by physical therapy and recommended for SNF rehab at discharge. This was discussed with her family who was also in agreement.  She was admitted with sepsis for pneumonia.  She was also encephalopathic and had generalized weakness.  She had MRI brain and EEG without acute abnormality.  Neurology saw her and obtained MRI T/L spine.  Her symptoms were thought to be due to mild to moderate catatonia.  She received 4 doses of ambien per neurology and seems to have improved, She was seen by pulm for a left hilar mass which was biopsied but patient's biopsy showed all necrotic tumor.  She had repeat bronchoscopy on 09/15/2020 and preliminary report shows non-small cell lung carcinoma.  Patient is scheduled to see pulmonology as an outpatient and they will get PET scan and they have also sent a message to oncology to see her outpatient as well.  All of this has been discussed with patient's daughter by pulmonologist.  Patient was evaluated by PT OT and they recommended discharging to SNF.  She is being discharged today in stable condition.  See below for additional details  Hospital Course:  Severe sepsis 2/2 Community Acquired Pneumonia (HCC)-resolved as of 09/04/2020 - now clinically improved on IV abx; okay to change toAzithro (continue flagyl)and will complete course (d/c cefepime) - follow up cultures: negative to date - completed abx  Acute Metabolic Encephalopathy       -  she was independent ~2 weeks ago at home and has daily decline        - she's lethargic today when I see her, she recently received the Lorrin Mais       - MRI at 12/16 at [facility] was negative for  acute events       - head CT today without acute findings       - will get MRI brain (no acute intracranial abnormality, moderate chronic small vessel ischemic changes and generalized volume loss) and EEG (mild diffuse encephalopathy, nonspecific)       - TSH wnl, follow ammonia (wnl), B12 (wnl), RPR (nonreactive)       - appreciate neurology, concern for 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 If related to general decline, encephalopathy, but she has marked LE weakness, unable to lift R heel off bed and just able to clear the L leg - with hx independence 2 weeks ago, will discuss with neurology regarding need for additional w/u        - appreciate neurology recs ->MRI with mild central canal stenosis and mild to oderate foraminal narrowing at T9-10, moderate L foraminal narrowing T10-11.  Convex L scoliosis.  Degenerative disc disease at L1-2 results in narrowing in R subarticular recess (could impact descending R L2 root).  Moderate R foralminal narrowing and narrowing in R subarticular recess at L2-3 could impact descending R L3 and exiting R L2 roots.  Moderate central canal stenosis and L worse than R subarticular recess narrowing at L3-4.  Bilateral foraminal narrowing at this level.          - will follow with neurology -> note lateral recess narrowing at 3 of lumbar levels may contribute to asymmetry seen on motor exam of lower extremities -> thought catatonia may have unmasked prreviously compensated subclinical RLE motor deficit  Orthostatic hypotension-resolved as of 09/02/2020 -Also possibly multifactorial in setting of infection as well as polypharmacy. Her medications reviewed and she is on sedating medications including clonazepam as well as anticholinergic mechanisms with nortriptyline which will be  held;Previous MD has also counseled her bedside that her clonazepam dose needs to be reduced at discharge and ideally would benefit from weaning off of it altogether (continue to taper given her AMS) -resume celexa at lower dose, 20 mg daily on 12/25 -Continue monitoring blood pressure which is improving -Repeat orthostatic blood pressure: negative on 12/24  PAF (paroxysmal atrial fibrillation) ([place]) -Discussed bedside with patient;Eliquis made her confused in the past. Denies any significant bleeding events. She is open to alternative anticoagulation -Discussed that we would trial Xarelto(started 12/27 but then paused between 09/13/2018 due to 09/16/2020 for bronchoscopy which was done on 09/15/2020.  Xarelto has been resumed. -Continue Coreg  Acute lower UTI-resolved as of 09/04/2020 -She does endorse some symptoms. 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 No seizures or epileptiform discharges were seen throughout the recording.  Procedure: Flexible bronchoscopy and EBUS Bronchoscopy  Consultations:  PCCM  neurology  Discharge Exam: Vitals:   09/15/20 2104 09/16/20 0333  BP: 122/66 134/76  Pulse: 85 75  Resp: 16 16  Temp: (!) 97.5 F (36.4 C) 98.5 F (36.9 C)  SpO2: 97% 96%   General exam: Appears calm and comfortable  Respiratory system: Clear to auscultation. Respiratory effort normal. Cardiovascular system: S1 & S2 heard, RRR. No JVD, murmurs, rubs, gallops or  clicks. No pedal edema. Gastrointestinal system: Abdomen is nondistended, soft and nontender. No organomegaly or masses felt. Normal bowel sounds heard. Central nervous system: Alert and oriented. No focal neurological deficits. Extremities: Symmetric 5 x 5 power. Skin: No rashes, lesions or ulcers.  Psychiatry: Judgement and insight appear normal. Mood & affect appropriate.    Discharge Instructions   Discharge Instructions    Ambulatory referral to Neurology   Complete by: As directed    An appointment is requested in approximately: 4 weeks   Ambulatory referral to Oncology   Complete by: As directed    Ambulatory referral to Spine Surgery   Complete by: As directed    Call MD for:  difficulty breathing, headache or visual disturbances   Complete by: As directed    Call MD for:  extreme fatigue   Complete by: As directed    Call MD for:  hives   Complete by: As directed    Call MD for:  persistant dizziness or light-headedness   Complete by: As directed    Call MD for:  persistant nausea and vomiting   Complete by: As directed    Call MD for:  redness, tenderness, or signs of infection (pain, swelling, redness, odor or green/yellow discharge around incision site)   Complete by: As directed    Call MD for:  severe uncontrolled pain   Complete by: As directed    Call MD for:  temperature >100.4   Complete by: As directed    Diet - low sodium heart healthy   Complete by: As directed    Diet - low sodium heart healthy   Complete by: As directed    Discharge instructions   Complete by: As directed    You were seen for sepsis from community acquired pneumonia.  You were found to have a left hilar mass (lung mass) which was sampled by the lung doctors.  The results are currently pending.  Your mental status has improved, but your weakness is persistent.  You'll need to follow up with neurology as an outpatient.  Your should also follow up with spine as an outpatient.  We've  stopped your nortriptyline and reduced your clonazepam dose.  Please follow outpatient for further management of these meds.  You have one more dose of ambien for the catatonia.  Avoid antipsychotics.  Continue your wrist splint.  You'll need a repeat x ray around 09/16/20.  You were started on xarelto for your atrial fibrillation.  Follow with your outpatient cardiologist for this.  Return for new, recurrent, or worsening symptoms.  Please ask your PCP to request records from this hospitalization so they know what was done and what the next steps will be.   Increase activity slowly   Complete by: As directed    Increase activity slowly   Complete by: As directed      Allergies as of 09/16/2020      Reactions  Nisoldipine Shortness Of Breath, Swelling, Other (See Comments)   Possibly made the body swell   Penicillin G Shortness Of Breath, Swelling, Other (See Comments)   Body swells   Ace Inhibitors Other (See Comments)   HYPERKALEMIA   Eliquis [apixaban] Other (See Comments)   Altered mental status       Medication List    STOP taking these medications   aspirin 325 MG tablet   clonazePAM 2 MG tablet Commonly known as: KLONOPIN   levofloxacin 500 MG tablet Commonly known as: LEVAQUIN   nortriptyline 10 MG capsule Commonly known as: PAMELOR   sulfamethoxazole-trimethoprim 800-160 MG tablet Commonly known as: BACTRIM DS     TAKE these medications   carvedilol 3.125 MG tablet Commonly known as: COREG Take 3.125 mg by mouth 2 (two) times daily with a meal.   citalopram 40 MG tablet Commonly known as: CELEXA Take 0.5 tablets (20 mg total) by mouth daily. What changed:   how much to take  when to take this   donepezil 10 MG tablet Commonly known as: ARICEPT Take 10 mg by mouth at bedtime.   latanoprost 0.005 % ophthalmic solution Commonly known as: XALATAN Place 1 drop into both eyes at bedtime.   levothyroxine 50 MCG tablet Commonly known as:  SYNTHROID Take 50 mcg by mouth daily before breakfast.   metFORMIN 1000 MG tablet Commonly known as: GLUCOPHAGE Take 1,000 mg by mouth 2 (two) times daily with a meal.   pantoprazole 40 MG tablet Commonly known as: PROTONIX Take 40 mg by mouth daily.   psyllium 58.6 % packet Commonly known as: METAMUCIL Take 1 packet by mouth at bedtime.   rivaroxaban 20 MG Tabs tablet Commonly known as: XARELTO Take 1 tablet (20 mg total) by mouth daily with supper.      Allergies  Allergen Reactions  . Nisoldipine Shortness Of Breath, Swelling and Other (See Comments)    Possibly made the body swell  . Penicillin G Shortness Of Breath, Swelling and Other (See Comments)    Body swells  . Ace Inhibitors Other (See Comments)    HYPERKALEMIA  . Eliquis [Apixaban] Other (See Comments)    Altered mental status     Contact information for follow-up providers    Dough, Jaymes Graff, MD.   Specialty: Family Medicine Contact information: [place] [address] [phone]        Schedule an appointment as soon as possible for a visit with Icard, Bradley L, DO.   Specialty: Pulmonary Disease Why: hospital follow up after bronchoscopy  Contact information: [place] [address] [phone]        Schedule an appointment as soon as possible for a visit with Curt Bears, MD.   Specialty: Oncology Why: Establish care with Oncologist  Contact information: [place] [address] [phone]            Contact information for after-discharge care    Destination    [facility] Preferred SNF .   Service: Skilled Nursing Contact information: [address] [place] [address] [phone]                   The results of significant diagnostics from this hospitalization (including imaging, microbiology, ancillary and laboratory) are listed below for reference.     Significant Diagnostic Studies: EEG  Result Date: 09/07/2020 Lora Havens, MD     09/07/2020  7:42 AM Patient  Name: CAYLYN TEDESCHI MRN: [id number] Epilepsy Attending: Lora Havens Referring Physician/Provider: Dr Fayrene Helper Date: 09/06/2020 Duration: 24.03 mins Patient history: 87yo F with ams. EEG to evaluate for seizure Level of alertness: Awake AEDs during EEG study: Clonazapem Technical aspects: This EEG study was done with scalp electrodes positioned according to the 10-20 International system of electrode placement. Electrical activity was acquired at a sampling rate of _0  and reviewed with a high frequency filter of _1  and a low frequency filter of _2 . EEG data were recorded continuously and digitally stored. Description: The posterior dominant rhythm consists of 7 Hz activity of moderate voltage (25-35 uV) seen predominantly in posterior head regions, symmetric and reactive to eye opening and eye closing. EEG showed continuous generalized 6-7 Hz theta slowing.Hyperventilation and photic stimulation were not performed.   ABNORMALITY -Continuous slow, generalized - Background slow IMPRESSION: This study is suggestive of mild diffuse encephalopathy, nonspecific etiology. No seizures or epileptiform discharges were seen throughout the recording. Priyanka Barbra Sarks   DG Wrist Complete Right  Result Date: 09/02/2020 CLINICAL DATA:  Medial right wrist pain medial wrist pain, initial encounter EXAM: RIGHT WRIST - COMPLETE 3+ VIEW COMPARISON:  None. FINDINGS: Radiocarpal degenerative changes are identified. No acute fracture is seen. Degenerative changes of the first [facility] joint are seen as well. Mild generalized soft tissue swelling is seen. No acute fracture is noted. 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 IMPRESSION: Degenerative changes and prior traumatic changes without acute abnormality Electronically Signed   By: Inez Catalina M.D.   On: 09/02/2020 16:32    DG Knee 1-2 Views Right  Result Date: 09/07/2020 CLINICAL DATA:  Fall. EXAM: RIGHT KNEE - 1-2 VIEW COMPARISON:  None. FINDINGS: Previous hardware fixation of the lateral tibial plateau. Remote fracture deformity of the proximal fibula noted. No joint effusion identified. Loose body within the suprapatellar joint space measures 8 mm. Moderate tricompartment osteoarthritis is noted with joint space narrowing and marginal spur formation. Chondrocalcinosis identified. IMPRESSION: 1. No acute findings. 2. Remote fracture deformity of the proximal fibula. 3. Moderate tricompartment osteoarthritis and chondrocalcinosis. Electronically Signed   By: Kerby Moors M.D.   On: 09/07/2020 12:08   CT HEAD WO CONTRAST  Result Date: 09/06/2020 CLINICAL DATA:  [place] EXAM: CT HEAD WITHOUT CONTRAST TECHNIQUE: Contiguous axial images were obtained from the base of the skull through the vertex without intravenous contrast. COMPARISON:  08/24/2020 FINDINGS: Brain: No evidence of acute infarction, hemorrhage, extra-axial collection, ventriculomegaly, or mass effect. Generalized cerebral atrophy. Periventricular white matter low attenuation likely secondary to microangiopathy. Vascular: Cerebrovascular atherosclerotic calcifications are noted. Skull: Negative for fracture or focal lesion. Sinuses/Orbits: Visualized portions of the orbits are unremarkable. Visualized portions of the paranasal sinuses are unremarkable. Visualized portions of the mastoid air cells are unremarkable. Other: None. IMPRESSION: 1. No acute intracranial pathology. 2. Chronic microvascular disease and cerebral atrophy. Electronically Signed   By: Kathreen Devoid   On: 09/06/2020 13:50   CT CHEST W CONTRAST  Result Date: 09/06/2020 CLINICAL DATA:  Respiratory failure. EXAM: CT CHEST WITH CONTRAST TECHNIQUE: Multidetector CT imaging of the chest was performed during intravenous contrast administration. CONTRAST:  46m OMNIPAQUE IOHEXOL 300 MG/ML   SOLN COMPARISON:  Radiograph of same day. FINDINGS: Cardiovascular: Atherosclerosis of thoracic aorta is noted without aneurysm or dissection. Mild cardiomegaly is noted. No pericardial effusion is noted. Mediastinum/Nodes: Thyroid gland and esophagus are unremarkable. 3.8 x 3.5 cm left hilar mass is noted concerning for malignancy or metastatic  disease. Lungs/Pleura: No pneumothorax is noted. Minimal right posterior basilar subsegmental atelectasis is noted. Small left pleural effusion is noted with adjacent left basilar subsegmental atelectasis or infiltrate. Upper Abdomen: No acute abnormality. Musculoskeletal: No chest wall abnormality. No acute or significant osseous findings. IMPRESSION: 1. 3.8 x 3.5 cm left hilar mass is noted concerning for malignancy or metastatic disease. PET scan is recommended for further evaluation. 2. Small left pleural effusion is noted with adjacent left basilar subsegmental atelectasis or infiltrate. 3. Aortic atherosclerosis. Aortic Atherosclerosis (ICD10-I70.0). Electronically Signed   By: Marijo Conception M.D.   On: 09/06/2020 13:49   CT CERVICAL SPINE WO CONTRAST  Result Date: 09/07/2020 CLINICAL DATA:  [place]. EXAM: CT CERVICAL SPINE WITHOUT CONTRAST TECHNIQUE: Multidetector CT imaging of the cervical spine was performed without intravenous contrast. Multiplanar CT image reconstructions were also generated. COMPARISON:  Cervical spine MRI dated 09/07/2020 FINDINGS: Alignment: Mild anterolisthesis at the C6-7 and C7-T1 levels. This is unchanged at the C7-T1 level since 11/19/2005. Skull base and vertebrae: No acute fracture. No primary bone lesion or focal pathologic process. Soft tissues and spinal canal: No prevertebral fluid or swelling. No visible canal hematoma. Disc levels: Multilevel degenerative changes, including facet degenerative changes throughout the cervical spine. Upper chest: Clear lung apices. Other: Right carotid artery atheromatous calcifications and minimal  left carotid artery atheromatous calcifications. IMPRESSION: 1. No fracture or traumatic subluxation. 2. Multilevel degenerative changes, as described above. 3. Right carotid artery atheromatous calcifications and minimal left carotid artery atheromatous calcifications. Electronically Signed   By: Claudie Revering M.D.   On: 09/07/2020 12:19   MR BRAIN WO CONTRAST  Result Date: 09/06/2020 CLINICAL DATA:  Encephalopathy EXAM: MRI HEAD WITHOUT CONTRAST TECHNIQUE: Multiplanar, multiecho pulse sequences of the brain and surrounding structures were obtained without intravenous contrast. COMPARISON:  08/24/2020 FINDINGS: Brain: No acute infarct, mass effect or extra-axial collection. No acute or chronic hemorrhage. Hyperintense T2-weighted signal is moderately widespread throughout the white matter. Generalized volume loss without a clear lobar predilection. The midline structures are normal. Vascular: Major flow voids are preserved. Skull and upper cervical spine: Normal calvarium and skull base. Visualized upper cervical spine and soft tissues are normal. Sinuses/Orbits:No paranasal sinus fluid levels or advanced mucosal thickening. No mastoid or middle ear effusion. Normal orbits. IMPRESSION: 1. No acute intracranial abnormality. 2. Moderate chronic small vessel ischemic changes and generalized volume loss. Electronically Signed   By: Ulyses Jarred M.D.   On: 09/06/2020 20:26   MR THORACIC SPINE WO CONTRAST  Result Date: 09/08/2020 CLINICAL DATA:  Bilateral upper and lower extremity weakness and pain. EXAM: MRI THORACIC AND LUMBAR SPINE WITHOUT CONTRAST TECHNIQUE: Multiplanar and multiecho pulse sequences of the thoracic and lumbar spine were obtained without intravenous contrast. COMPARISON:  CT chest 09/06/2020.  CT abdomen and pelvis 06/21/2017. FINDINGS: MRI THORACIC SPINE FINDINGS Alignment: 0.3 cm anterolisthesis C7 on T1 and trace anterolisthesis T9 on T10. Vertebrae: No fracture, evidence of discitis, or  bone lesion. Scattered small Schmorl's nodes are noted. Mild degenerative endplate signal change is seen anteriorly at T9-10 and T10-11. Cord:  Normal signal throughout. Paraspinal and other soft tissues: Left hilar mass is better demonstrated on the patient's recent chest CT. Otherwise negative. Disc levels: T1-2: There is loss of disc space height with a shallow bulge and endplate spur. The central canal is open. Moderate bilateral foraminal narrowing is noted. T2-3: Tiny central protrusion without stenosis. T3-4: Negative. T4-5: Minimal bulge without stenosis. T5-6: Negative. T6-7: Minimal bulge without stenosis. T7-8: Minimal  bulge without stenosis. T8-9: Moderate facet arthropathy.  Shallow disc bulge.  No stenosis. T9-10: Moderate facet arthritis moderate to moderately severe facet degenerative change, worse on the left. The disc is uncovered with a shallow bulge and endplate spur and there is some ligamentum flavum thickening. There is mild central canal stenosis. Mild to moderate foraminal narrowing is worse on the left. T10-11: Left worse than right facet degenerative disease. Minimal disc bulge. Moderate left foraminal narrowing. The central canal and right foramen are open. T11-12: Facet degenerative change.  Otherwise negative. T12-L1: Facet degenerative disease and a minimal disc bulge. No stenosis. MRI LUMBAR SPINE FINDINGS Segmentation:  Standard. Alignment: There is convex left scoliosis with the apex at approximately L2-3 Vertebrae:  No fracture, evidence of discitis, or bone lesion. Conus medullaris and cauda equina: Conus extends to the L2 level. Conus and cauda equina appear normal. Paraspinal and other soft tissues: Negative. Disc levels: L1-2: There is loss of disc space height. Disc bulge and endplate spur are more prominent to the right. There is also some ligamentum flavum thickening which is worse on the right. Narrowing in the right lateral recess could impact the descending right L2 root.  Foramina are open. L2-3: Central protrusion and endplate spur. There is some narrowing in the right subarticular recess and moderate right foraminal narrowing. The left foramen is open. Facet degenerative change is more notable on the right. L3-4: Bilateral facet arthropathy. Broad-based disc bulge and endplate spur. Vacuum disc phenomenon. Moderate central canal stenosis and left worse than right subarticular recess narrowing. Moderately severe bilateral foraminal narrowing. L4-5: There is autologous fusion across the disc interspace and the facets are ankylosed. No bulge or protrusion. No stenosis. L5-S1: Autologous fusion across the disc interspace and ankylosis of the left facets. No stenosis. IMPRESSION: MR THORACIC SPINE IMPRESSION Known left hilar mass is better demonstrated on recent chest CT. Mild central canal stenosis and mild to moderate foraminal narrowing at T9-10. Facet degenerative change at this level is worse on the left. Moderate left foraminal narrowing T10-11. MR LUMBAR SPINE IMPRESSION Convex left scoliosis. Degenerative disc disease at L1-2 results in narrowing in the right subarticular recess which could impact the descending right L2 root. Moderate right foraminal narrowing and narrowing in the right subarticular recess at L2-3 could impact the descending right L3 and exiting right L2 roots. Moderate central canal stenosis and left worse than right subarticular recess narrowing at L3-4. There is also moderately severe bilateral foraminal narrowing at this level. Electronically Signed   By: Inge Rise M.D.   On: 09/08/2020 11:23   MR LUMBAR SPINE WO CONTRAST  Result Date: 09/08/2020 CLINICAL DATA:  Bilateral upper and lower extremity weakness and pain. EXAM: MRI THORACIC AND LUMBAR SPINE WITHOUT CONTRAST TECHNIQUE: Multiplanar and multiecho pulse sequences of the thoracic and lumbar spine were obtained without intravenous contrast. COMPARISON:  CT chest 09/06/2020.  CT abdomen and  pelvis 06/21/2017. FINDINGS: MRI THORACIC SPINE FINDINGS Alignment: 0.3 cm anterolisthesis C7 on T1 and trace anterolisthesis T9 on T10. Vertebrae: No fracture, evidence of discitis, or bone lesion. Scattered small Schmorl's nodes are noted. Mild degenerative endplate signal change is seen anteriorly at T9-10 and T10-11. Cord:  Normal signal throughout. Paraspinal and other soft tissues: Left hilar mass is better demonstrated on the patient's recent chest CT. Otherwise negative. Disc levels: T1-2: There is loss of disc space height with a shallow bulge and endplate spur. The central canal is open. Moderate bilateral foraminal narrowing is noted. T2-3: Tiny central  protrusion without stenosis. T3-4: Negative. T4-5: Minimal bulge without stenosis. T5-6: Negative. T6-7: Minimal bulge without stenosis. T7-8: Minimal bulge without stenosis. T8-9: Moderate facet arthropathy.  Shallow disc bulge.  No stenosis. T9-10: Moderate facet arthritis moderate to moderately severe facet degenerative change, worse on the left. The disc is uncovered with a shallow bulge and endplate spur and there is some ligamentum flavum thickening. There is mild central canal stenosis. Mild to moderate foraminal narrowing is worse on the left. T10-11: Left worse than right facet degenerative disease. Minimal disc bulge. Moderate left foraminal narrowing. The central canal and right foramen are open. T11-12: Facet degenerative change.  Otherwise negative. T12-L1: Facet degenerative disease and a minimal disc bulge. No stenosis. MRI LUMBAR SPINE FINDINGS Segmentation:  Standard. Alignment: There is convex left scoliosis with the apex at approximately L2-3 Vertebrae:  No fracture, evidence of discitis, or bone lesion. Conus medullaris and cauda equina: Conus extends to the L2 level. Conus and cauda equina appear normal. Paraspinal and other soft tissues: Negative. Disc levels: L1-2: There is loss of disc space height. Disc bulge and endplate spur are  more prominent to the right. There is also some ligamentum flavum thickening which is worse on the right. Narrowing in the right lateral recess could impact the descending right L2 root. Foramina are open. L2-3: Central protrusion and endplate spur. There is some narrowing in the right subarticular recess and moderate right foraminal narrowing. The left foramen is open. Facet degenerative change is more notable on the right. L3-4: Bilateral facet arthropathy. Broad-based disc bulge and endplate spur. Vacuum disc phenomenon. Moderate central canal stenosis and left worse than right subarticular recess narrowing. Moderately severe bilateral foraminal narrowing. L4-5: There is autologous fusion across the disc interspace and the facets are ankylosed. No bulge or protrusion. No stenosis. L5-S1: Autologous fusion across the disc interspace and ankylosis of the left facets. No stenosis. IMPRESSION: MR THORACIC SPINE IMPRESSION Known left hilar mass is better demonstrated on recent chest CT. Mild central canal stenosis and mild to moderate foraminal narrowing at T9-10. Facet degenerative change at this level is worse on the left. Moderate left foraminal narrowing T10-11. MR LUMBAR SPINE IMPRESSION Convex left scoliosis. Degenerative disc disease at L1-2 results in narrowing in the right subarticular recess which could impact the descending right L2 root. Moderate right foraminal narrowing and narrowing in the right subarticular recess at L2-3 could impact the descending right L3 and exiting right L2 roots. Moderate central canal stenosis and left worse than right subarticular recess narrowing at L3-4. There is also moderately severe bilateral foraminal narrowing at this level. Electronically Signed   By: Inge Rise M.D.   On: 09/08/2020 11:23   DG CHEST PORT 1 VIEW  Addendum Date: 09/06/2020   ADDENDUM REPORT: 09/06/2020 11:03 ADDENDUM: Findings of mass effect in the LEFT lower lobe upon bronchial structures  were discussed with the referring physician as outlined below. Findings are more likely due to worsening of pulmonary parenchymal process. CT of the chest was however suggested to further evaluate this finding given rapid change and to exclude the possibility of acute vascular process related to the aorta given the location. These results were called by telephone at the time of interpretation on 09/06/2020 at 11:03 am to provider A Cephus Slater , who verbally acknowledged these results. Electronically Signed   By: Zetta Bills M.D.   On: 09/06/2020 11:03   Result Date: 09/06/2020 CLINICAL DATA:  Cough, hypotension atrial fibrillation EXAM: PORTABLE CHEST 1 VIEW  COMPARISON:  August 30, 2020 FINDINGS: Trachea midline. The LEFT lower lobe bronchus is splayed atop an area measuring approximately 8.4 x 6.5 cm with some peripheral airspace disease noted in the LEFT lower lobe with obscured LEFT hemidiaphragm. Airspace process more confluent than on the prior study, likely associated with small effusion in the LEFT lung base. RIGHT chest is clear. No acute skeletal process. IMPRESSION: Mass effect along the LEFT retrocardiac region up lifting the LEFT mainstem and lower lobe bronchi with some lower lobe bronchial narrowing suggested, associated with airspace disease and effusion. Findings are suspicious first and foremost for developing pulmonary abscess or worsening pneumonia though there is no air-fluid level seen on the current study. Vascular etiology and aneurysm also considered given the appearance though felt less likely. Would suggest contrasted CT of the chest for further evaluation given the rapid change in the appearance of the LEFT lower lobe since the previous study. A call is out to the referring provider to further discuss findings in the above case. Electronically Signed: By: Zetta Bills M.D. On: 09/06/2020 10:30   DG Chest Port 1 View  Result Date: 08/30/2020 CLINICAL DATA:  Sepsis. EXAM:  PORTABLE CHEST 1 VIEW COMPARISON:  08/28/2020 FINDINGS: Cardiomegaly remains stable. Mild infiltrate or atelectasis in the left lung base appears increased since previous study. Linear opacity at the right lung base is stable and may be due to subsegmental atelectasis or scarring. IMPRESSION: Increased mild left basilar infiltrate or atelectasis. Stable right basilar atelectasis versus scarring. Stable cardiomegaly. Electronically Signed   By: Marlaine Hind M.D.   On: 08/30/2020 18:27   DG Hand Complete Right  Result Date: 09/02/2020 CLINICAL DATA:  Medial wrist pain, fall 1 week ago, initial encounter EXAM: RIGHT HAND - COMPLETE 3+ VIEW COMPARISON:  None. FINDINGS: Degenerative changes are noted at the radiocarpal joint the first [facility] joint. Interphalangeal degenerative changes are noted as well. Widening of the scapholunate space is noted better visualized than on the prior wrist films likely related to prior trauma. No acute fracture or dislocation is seen. Mild soft tissue swelling is noted about the wrist. 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 IMPRESSION: Degenerative and posttraumatic changes as described. No acute abnormality noted. Electronically Signed   By: Inez Catalina M.D.   On: 09/02/2020 16:36   DG HIP UNILAT WITH PELVIS 2-3 VIEWS LEFT  Result Date: 09/07/2020 CLINICAL DATA:  Bilateral hip pain after a fall.  Initial encounter. EXAM: DG HIP (WITH OR WITHOUT PELVIS) 2-3V LEFT; DG HIP (WITH OR WITHOUT PELVIS) 2-3V RIGHT COMPARISON:  Plain films of the hips 08/26/2020. FINDINGS: No acute bony or joint abnormality is seen. 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 IMPRESSION: No acute abnormality. Electronically Signed   By: Inge Rise M.D.   On: 09/07/2020 12:09   DG HIP UNILAT WITH PELVIS 2-3 VIEWS RIGHT  Result Date: 09/07/2020 CLINICAL DATA:  Bilateral hip pain after a fall.  Initial encounter. EXAM: DG HIP (WITH OR WITHOUT PELVIS) 2-3V LEFT; DG HIP  (WITH OR WITHOUT PELVIS) 2-3V RIGHT COMPARISON:  Plain films of the hips 08/26/2020. FINDINGS: No acute bony or joint abnormality is seen. 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 IMPRESSION: No acute abnormality. Electronically Signed   By: Inge Rise M.D.   On: 09/07/2020 12:09   VAS [place] UPPER EXTREMITY VENOUS DUPLEX  Result Date: 09/11/2020 UPPER VENOUS STUDY  Indications: Swelling, and Pain Limitations: Line. Comparison Study: No previous Performing Technologist: Vonzell Schlatter  RVT  Examination Guidelines: A complete evaluation includes B-mode imaging, spectral Doppler, color Doppler, and power Doppler as needed of all accessible portions of each vessel. Bilateral testing is considered an integral part of a complete examination. Limited examinations for reoccurring indications may be performed as noted.  Right Findings: +----------+------------+---------+-----------+----------+-------+ RIGHT     CompressiblePhasicitySpontaneousPropertiesSummary +----------+------------+---------+-----------+----------+-------+ Subclavian               Yes       Yes                      +----------+------------+---------+-----------+----------+-------+  Left Findings: +----------+------------+---------+-----------+----------+-------+ LEFT      CompressiblePhasicitySpontaneousPropertiesSummary +----------+------------+---------+-----------+----------+-------+ IJV           Full       Yes       Yes                      +----------+------------+---------+-----------+----------+-------+ Subclavian    Full                                          +----------+------------+---------+-----------+----------+-------+ Axillary      Full                                          +----------+------------+---------+-----------+----------+-------+ Brachial      Full       Yes       Yes                      +----------+------------+---------+-----------+----------+-------+ Radial         Full                                          +----------+------------+---------+-----------+----------+-------+ Ulnar         Full                                          +----------+------------+---------+-----------+----------+-------+ Cephalic      Full                                          +----------+------------+---------+-----------+----------+-------+ Basilic       Full                                          +----------+------------+---------+-----------+----------+-------+  Summary:  Right: No evidence of thrombosis in the subclavian.  Left: No evidence of deep vein thrombosis in the upper extremity. No evidence of superficial vein thrombosis in the upper extremity.  *See table(s) above for measurements and observations.  Diagnosing physician: Ruta Hinds MD Electronically signed by Ruta Hinds MD on 09/11/2020 at 12:15:12 PM.    Final     Microbiology: Recent Results (from the past 240 hour(s))  Resp Panel by RT-PCR (Flu A&B, Covid) Nasopharyngeal Swab     Status: None   Collection Time: 09/11/20  2:07 PM   Specimen: Nasopharyngeal Swab; Nasopharyngeal(NP) swabs in vial transport medium  Result Value Ref Range Status   SARS Coronavirus 2 by RT PCR NEGATIVE NEGATIVE Final    Comment: (NOTE) SARS-CoV-2 target nucleic acids are NOT DETECTED.  The SARS-CoV-2 RNA is generally detectable in upper respiratory specimens during the acute phase of infection. The lowest concentration of SARS-CoV-2 viral copies this assay can detect is 138 copies/mL. A negative result does not preclude SARS-Cov-2 infection and should not be used as the sole basis for treatment or other patient management decisions. A negative result may occur with  improper specimen collection/handling, submission of specimen other than nasopharyngeal swab, presence of viral mutation(s) within the areas targeted by this assay, and inadequate number of viral copies(<138 copies/mL). A  negative result must be combined with clinical observations, patient history, and epidemiological information. The expected result is Negative.  Fact Sheet for Patients:  EntrepreneurPulse.com.au  Fact Sheet for Healthcare Providers:  IncredibleEmployment.be  This test is no t yet approved or cleared by the [place] FDA and  has been authorized for detection and/or diagnosis of SARS-CoV-2 by FDA under an Emergency Use Authorization (EUA). This EUA will remain  in effect (meaning this test can be used) for the duration of the COVID-19 declaration under Section 564(b)(1) of the Act, 21 U.S.C.section 360bbb-3(b)(1), unless the authorization is terminated  or revoked sooner.       Influenza A by PCR NEGATIVE NEGATIVE Final   Influenza B by PCR NEGATIVE NEGATIVE Final    Comment: (NOTE) The Xpert Xpress SARS-CoV-2/FLU/RSV plus assay is intended as an aid in the diagnosis of influenza from Nasopharyngeal swab specimens and should not be used as a sole basis for treatment. Nasal washings and aspirates are unacceptable for Xpert Xpress SARS-CoV-2/FLU/RSV testing.  Fact Sheet for Patients: EntrepreneurPulse.com.au  Fact Sheet for Healthcare Providers: IncredibleEmployment.be  This test is not yet approved or cleared by the [place] FDA and has been authorized for detection and/or diagnosis of SARS-CoV-2 by FDA under an Emergency Use Authorization (EUA). This EUA will remain in effect (meaning this test can be used) for the duration of the COVID-19 declaration under Section 564(b)(1) of the Act, 21 U.S.C. section 360bbb-3(b)(1), unless the authorization is terminated or revoked.  Performed at [facility], [place] [address] [place], [place] [address]      Labs: Basic Metabolic Panel: Recent Labs  Lab 09/10/20 0139 09/11/20 0246 09/12/20 0127 09/16/20 0110  NA 135 134* 136 134*  K 5.1 4.6  5.1 4.8  CL 100 99 98 101  CO2 _0 GLUCOSE 190* 120* 125* 174*  BUN 25* 27* 22 16  CREATININE 0.72 0.77 0.70 0.72  CALCIUM 8.6* 8.5* 8.6* 8.8*  MG 2.1 2.0 1.9  --   PHOS 3.9 3.3 4.3  --    Liver Function Tests: Recent Labs  Lab 09/10/20 0139 09/11/20 0246 09/12/20 0127  AST 47* 75* 58*  ALT 35 52* 51*  ALKPHOS 70 68 71  BILITOT 0.6 0.6 0.5  PROT 6.1* 6.3* 6.0*  ALBUMIN 1.7* 1.8* 2.0*   No results for input(s): LIPASE, AMYLASE in the last 168 hours. No results for input(s): AMMONIA in the last 168 hours. CBC: Recent Labs  Lab 09/10/20 0139 09/11/20 0246 09/16/20 0110  WBC 10.7* 8.8 5.7  NEUTROABS 9.5* 6.4 4.4  HGB 10.4* 10.5* 11.8*  HCT 32.9* 31.6* 35.7*  MCV 94.0 92.9 93.7  PLT 320 336 371  Cardiac Enzymes: No results for input(s): CKTOTAL, CKMB, CKMBINDEX, TROPONINI in the last 168 hours. BNP: BNP (last 3 results) No results for input(s): BNP in the last 8760 hours.  ProBNP (last 3 results) No results for input(s): PROBNP in the last 8760 hours.  CBG: Recent Labs  Lab 09/15/20 1203 09/15/20 1727 09/15/20 2105 09/16/20 0812 09/16/20 1150  GLUCAP 150* 147* 221* 148* 142*       Signed:  Darliss Cheney MD.  [facility] 09/16/2020, 12:12 PM

## 2020-09-16 NOTE — TOC Transition Note (Addendum)
Transition of Care [facility]) - CM/SW Discharge Note   Patient Details  Name: Ashlee Greer MRN: [id number] Date of Birth: 11/09/1932  Transition of Care [facility]) CM/SW Contact:  Bary Castilla, LCSW Phone Number: [phone] 09/16/2020, 12:30 PM   Clinical Narrative:     Patient will DC to:?[facility] Anticipated DC date:09/16/2020? Family notified:?Sarah Transport by: Corey Harold   Per MD patient ready for DC to [place]. RN, patient, patient's family, and facility notified of DC. Discharge Summary sent to facility via fax.. RN given number for report  [phone] room 218. DC packet on chart. Ambulance transport requested for patient.   CSW signing off.   [place], [place] [phone]   Final next level of care: Skilled Nursing Facility Barriers to Discharge: No Barriers Identified   Patient Goals and CMS Choice        Discharge Placement              Patient chooses bed at: [facility] Patient to be transferred to facility by: [place] Name of family member notified: Judson Roch Patient and family notified of of transfer: 09/16/20  Discharge Plan and Services   Discharge Planning Services: CM Consult Post Acute Care Choice: Durable Medical Equipment                               Social Determinants of Health (SDOH) Interventions     Readmission Risk Interventions No flowsheet data found.

## 2020-09-16 NOTE — Progress Notes (Signed)
ANTICOAGULATION CONSULT NOTE - Initial Consult  Pharmacy Consult for Rivaroxaban Indication: atrial fibrillation  Allergies  Allergen Reactions  . Nisoldipine Shortness Of Breath, Swelling and Other (See Comments)    Possibly made the body swell  . Penicillin G Shortness Of Breath, Swelling and Other (See Comments)    Body swells  . Ace Inhibitors Other (See Comments)    HYPERKALEMIA  . Eliquis [Apixaban] Other (See Comments)    Altered mental status    Labs: Recent Labs    09/16/20 0110  HGB 11.8*  HCT 35.7*  PLT 371  CREATININE 0.72   Assessment: Patient with afib started on rivaroxaban while admitted due to [place] and recent TIA. Restarting rivaroxaban after holding for biopsy and bronch.   Goal of Therapy:  Monitor platelets by anticoagulation protocol: Yes   Plan:  Restart rivaroxaban 20mg  daily  Monitor CBC and signs/symptoms bleeding or clotting   Alfonse Spruce, PharmD PGY2 ID Pharmacy Resident Phone between 7 am - 3:30 pm: [phone]  Please check AMION for all [place] phone numbers After 10:00 PM, call [place] [phone]  09/16/2020,8:14 AM

## 2020-09-16 NOTE — TOC Progression Note (Addendum)
Transition of Care [facility]) - Progression Note    Patient Details  Name: Ashlee Greer MRN: [id number] Date of Birth: 1933-08-25  Transition of Care [facility]) CM/SW [place],  Phone Number: [phone] 09/16/2020, 9:54 AM  Clinical Narrative:     CSW was alerted that patient was medically ready for discharge. CSW spoke with Maudie Mercury at [facility] to confirm the discharge today. Kim informed CSW that she would need to confirm valid authorization. CSW explained that it appeared that patient had a valid from 1/6-1/10 however she wanted to confirm.   Also, patient is in need of a new COVID. Kim informed CSW that she would call her back.  MD alerted about new COVID.  10:07am- Patient's authorization is good, now just waiting on new COVID.  TOC team will continue to assist with discharge planning needs.  Expected Discharge Plan: [place] Barriers to Discharge: Continued Medical Work up  Expected Discharge Plan and Services Expected Discharge Plan: [place]   Discharge Planning Services: CM Consult Post Acute Care Choice: Durable Medical Equipment Living arrangements for the past 2 months: Single Family Home Expected Discharge Date: 09/16/20                                     Social Determinants of Health (SDOH) Interventions    Readmission Risk Interventions No flowsheet data found.

## 2020-09-17 LAB — GLUCOSE, CAPILLARY: Glucose-Capillary: 123 mg/dL — ABNORMAL HIGH (ref 70–99)

## 2020-09-17 NOTE — Progress Notes (Signed)
Patient seen and examined this morning.  Patient was initially discharged to nursing home yesterday, Covid test was done however due to delay in the results of the Covid test, patient ended up staying in the hospital yesterday.  Covid test is negative now.  Patient has no complaints and she is as a stable for discharge as she was yesterday with no change in medical status.  General exam: Appears calm and comfortable  Respiratory system: Clear to auscultation. Respiratory effort normal. Cardiovascular system: S1 & S2 heard, RRR. No JVD, murmurs, rubs, gallops or clicks. No pedal edema. Gastrointestinal system: Abdomen is nondistended, soft and nontender. No organomegaly or masses felt. Normal bowel sounds heard. Central nervous system: Alert and oriented. No focal neurological deficits. Skin: No rashes, lesions or ulcers.    Also spoke to daughter 1 more time over the phone today.  Answered several questions again today.  She remains agreeable with the discharge to nursing home today.  Please review my discharge summary from yesterday for details.

## 2020-09-17 NOTE — TOC Progression Note (Addendum)
Transition of Care [facility]) - Progression Note    Patient Details  Name: Ashlee Greer MRN: [id number] Date of Birth: 1933/06/26  Transition of Care [facility]) CM/SW [place], [place] Phone Number: [phone] 09/17/2020, 8:43 AM  Clinical Narrative:     COVID test came back last night around 11:00PM will arrange for patient to be transported to facility this morning.  9:00Am Spoke with Melissa at facility and she informed CSW that another report was not needed.  CSW spoke with daughter Judson Roch and confirmed discharge today.     Expected Discharge Plan: [place] Barriers to Discharge: No Barriers Identified  Expected Discharge Plan and Services Expected Discharge Plan: [place]   Discharge Planning Services: CM Consult Post Acute Care Choice: Durable Medical Equipment Living arrangements for the past 2 months: Single Family Home Expected Discharge Date: 09/16/20                                     Social Determinants of Health (SDOH) Interventions    Readmission Risk Interventions No flowsheet data found.

## 2020-09-19 ENCOUNTER — Telehealth: Payer: Self-pay | Admitting: *Deleted

## 2020-09-19 NOTE — Telephone Encounter (Signed)
I received referral on Ms. Ashlee Greer.  Path is pending and PET scan is scheduled for 1/19.  I called to schedule her for [place] on 09/28/20.  I was unable to reach her but did leave a vm message with my name and phone number to call.

## 2020-09-20 ENCOUNTER — Telehealth: Payer: Self-pay | Admitting: *Deleted

## 2020-09-20 LAB — CYTOLOGY - NON PAP

## 2020-09-20 NOTE — Telephone Encounter (Signed)
I received referral on Ms. Durio.  I called and her son answered.  He was updated on appt.  He asked that I call [facility] [phone] to update them on her appt due to they will provider her transportation.  I called and left a vm message with their transportation service of appt time, place, location.  I also left my name and phone number to call if they had any questions.

## 2020-09-26 NOTE — Progress Notes (Incomplete)
This pending note was pre-charted for an anticipated clinic visit 09/28/20.  The patient did not present for that appointment, and may be rescheduled.  I am leaving this work in pending state so that it can be used if the patient is rescheduled to see me soon. - MM        Radiation Oncology         ([phone] ________________________________  Multidisciplinary [facility] On[facility]) Initial Outpatient Consultation  Name: Ashlee Greer MRN: [id number]  Date: 09/28/2020  DOB: 1932-11-24  GH:WEXHB, Jaymes Graff, MD  Garner Nash, DO   REFERRING PHYSICIAN: Garner Nash, DO  DIAGNOSIS:  87 yo woman with stage T3N0M0 non-small cell carcinoma of the left lower lung - Stage IIB    ICD-10-CM   1. NSCLC of left lung [facility])  C34.92     HISTORY OF PRESENT ILLNESS::Ashlee Greer is a 87 y.o. female who was admitted to the hospital on 08/30/20 with urosepsis and chest X-ray on admission showed left basilar infiltrate or atelectasis potentially representing airspace disease.  Follow-up CXR on 09/04/20 showed some evolution of the left lower opacity with splaying of the left lower lobe bronchus over a 8.4 x 6.5 cm area:    Subsequent chest CT on 09/06/20 showed a 3.8 x 3.5 cm left hilar mass concerning for malignancy:    Bronchoscopy was performed on 09/12/20 revealed necrotic tissue.  On 09/15/20, she underwent repeat flexible video fiberoptic bronchoscopy with endobronchial ultrasound and biopsies.  Wang needle fine needle aspiration of the left hilar mass showed neoplastic cells with neuroendocrine staining and non-small cell morphological features.  She underwent PET on 09/27/20 for staging which showed a hypermetabolic left hilar mass consistent with known neoplasm but no hypermetabolic mediastinal or hilar lymphadenopathy and no evidence of metastatic disease elsewhere.  The patient was referred today for presentation in the multidisciplinary conference.  Radiology studies and  pathology slides were presented there for review and discussion of treatment options.  A consensus was discussed regarding potential next steps.  PREVIOUS RADIATION THERAPY: {EXAM; YES/NO:19492::"No"}  PAST MEDICAL HISTORY:  has a past medical history of A-fib ([place]), Anxiety, Arthritis, Depression, Diabetes mellitus without complication ([place]), Glaucoma, Hypertension, and Thyroid disease.    PAST SURGICAL HISTORY: Past Surgical History:  Procedure Laterality Date  . BRONCHIAL NEEDLE ASPIRATION BIOPSY  09/15/2020   Procedure: BRONCHIAL NEEDLE ASPIRATION BIOPSIES;  Surgeon: Garner Nash, DO;  Location: [place] ENDOSCOPY;  Service: Pulmonary;;  . FINE NEEDLE ASPIRATION  09/09/2020   Procedure: FINE NEEDLE ASPIRATION (FNA) LINEAR;  Surgeon: Candee Furbish, MD;  Location: [facility] ENDOSCOPY;  Service: Pulmonary;;  . JOINT REPLACEMENT Right    knee replacement  . VIDEO BRONCHOSCOPY WITH ENDOBRONCHIAL ULTRASOUND Left 09/09/2020   Procedure: VIDEO BRONCHOSCOPY WITH ENDOBRONCHIAL ULTRASOUND;  Surgeon: Candee Furbish, MD;  Location: [facility] ENDOSCOPY;  Service: Pulmonary;  Laterality: Left;  Marland Kitchen VIDEO BRONCHOSCOPY WITH ENDOBRONCHIAL ULTRASOUND N/A 09/15/2020   Procedure: VIDEO BRONCHOSCOPY WITH ENDOBRONCHIAL ULTRASOUND;  Surgeon: Garner Nash, DO;  Location: [place];  Service: Pulmonary;  Laterality: N/A;    FAMILY HISTORY: family history is not on file.  SOCIAL HISTORY:  reports that she has never smoked. She has never used smokeless tobacco. She reports previous alcohol use. She reports previous drug use.  ALLERGIES: Nisoldipine, Penicillin g, Ace inhibitors, and Eliquis [apixaban]  MEDICATIONS:  Current Outpatient Medications  Medication Sig Dispense Refill  . carvedilol (COREG) 3.125 MG tablet Take 3.125 mg by mouth 2 (two)  times daily with a meal.    . citalopram (CELEXA) 40 MG tablet Take 0.5 tablets (20 mg total) by mouth daily. 15 tablet 0  . donepezil (ARICEPT) 10 MG tablet Take 10 mg by mouth at  bedtime.    Marland Kitchen latanoprost (XALATAN) 0.005 % ophthalmic solution Place 1 drop into both eyes at bedtime.    Marland Kitchen levothyroxine (SYNTHROID) 50 MCG tablet Take 50 mcg by mouth daily before breakfast.    . metFORMIN (GLUCOPHAGE) 1000 MG tablet Take 1,000 mg by mouth 2 (two) times daily with a meal.    . pantoprazole (PROTONIX) 40 MG tablet Take 40 mg by mouth daily.    . psyllium (METAMUCIL) 58.6 % packet Take 1 packet by mouth at bedtime.    . rivaroxaban (XARELTO) 20 MG TABS tablet Take 1 tablet (20 mg total) by mouth daily with supper. 30 tablet 0   No current facility-administered medications for this visit.    REVIEW OF SYSTEMS:  A 15 point review of systems is documented in the electronic medical record. This was obtained by the nursing staff. However, I reviewed this with the patient to discuss relevant findings and make appropriate changes.  {Ros - complete:30496}   PHYSICAL EXAM:  vitals were not taken for this visit.   {Exam, Complete:17964}  KPS = ***  100 - Normal; no complaints; no evidence of disease. 90   - Able to carry on normal activity; minor signs or symptoms of disease. 80   - Normal activity with effort; some signs or symptoms of disease. 44   - Cares for self; unable to carry on normal activity or to do active work. 60   - Requires occasional assistance, but is able to care for most of his personal needs. 50   - Requires considerable assistance and frequent medical care. 27   - Disabled; requires special care and assistance. 62   - Severely disabled; hospital admission is indicated although death not imminent. 74   - Very sick; hospital admission necessary; active supportive treatment necessary. 10   - Moribund; fatal processes progressing rapidly. 0     - Dead  Karnofsky DA, Abelmann [place], Craver LS and Burchenal [facility] [phone]) The use of the nitrogen mustards in the palliative treatment of carcinoma: with particular reference to bronchogenic carcinoma Cancer 1  [id number]  LABORATORY DATA:  Lab Results  Component Value Date   WBC 5.7 09/16/2020   HGB 11.8 (L) 09/16/2020   HCT 35.7 (L) 09/16/2020   MCV 93.7 09/16/2020   PLT 371 09/16/2020   Lab Results  Component Value Date   NA 134 (L) 09/16/2020   K 4.8 09/16/2020   CL 101 09/16/2020   CO2 23 09/16/2020   Lab Results  Component Value Date   ALT 51 (H) 09/12/2020   AST 58 (H) 09/12/2020   ALKPHOS 71 09/12/2020   BILITOT 0.5 09/12/2020    PULMONARY FUNCTION TEST:   Recent Review Flowsheet Data   There is no flowsheet data to display.     RADIOGRAPHY: EEG  Result Date: 09/07/2020 Lora Havens, MD     09/07/2020  7:42 AM Patient Name: LORELIE BIERMANN MRN: [id number] Epilepsy Attending: Lora Havens Referring Physician/Provider: Dr Fayrene Helper Date: 09/06/2020 Duration: 24.03 mins Patient history: 87yo F with ams. EEG to evaluate for seizure Level of alertness: Awake AEDs during EEG study: Clonazapem Technical aspects: This EEG study was done with scalp electrodes positioned according to the 10-20 International system of electrode  placement. Electrical activity was acquired at a sampling rate of 500Hz  and reviewed with a high frequency filter of 70Hz  and a low frequency filter of 1Hz . EEG data were recorded continuously and digitally stored. Description: The posterior dominant rhythm consists of 7 Hz activity of moderate voltage (25-35 uV) seen predominantly in posterior head regions, symmetric and reactive to eye opening and eye closing. EEG showed continuous generalized 6-7 Hz theta slowing.Hyperventilation and photic stimulation were not performed.   ABNORMALITY -Continuous slow, generalized - Background slow IMPRESSION: This study is suggestive of mild diffuse encephalopathy, nonspecific etiology. No seizures or epileptiform discharges were seen throughout the recording. Priyanka Barbra Sarks   DG Wrist Complete Right  Result Date: 09/02/2020 CLINICAL DATA:  Medial right wrist pain  medial wrist pain, initial encounter EXAM: RIGHT WRIST - COMPLETE 3+ VIEW COMPARISON:  None. FINDINGS: Radiocarpal degenerative changes are identified. No acute fracture is seen. Degenerative changes of the first [facility] joint are seen as well. Mild generalized soft tissue swelling is seen. No acute fracture is noted. 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 IMPRESSION: Degenerative changes and prior traumatic changes without acute abnormality Electronically Signed   By: Inez Catalina M.D.   On: 09/02/2020 16:32   DG Knee 1-2 Views Right  Result Date: 09/07/2020 CLINICAL DATA:  Fall. EXAM: RIGHT KNEE - 1-2 VIEW COMPARISON:  None. FINDINGS: Previous hardware fixation of the lateral tibial plateau. Remote fracture deformity of the proximal fibula noted. No joint effusion identified. Loose body within the suprapatellar joint space measures 8 mm. Moderate tricompartment osteoarthritis is noted with joint space narrowing and marginal spur formation. Chondrocalcinosis identified. IMPRESSION: 1. No acute findings. 2. Remote fracture deformity of the proximal fibula. 3. Moderate tricompartment osteoarthritis and chondrocalcinosis. Electronically Signed   By: Kerby Moors M.D.   On: 09/07/2020 12:08   CT HEAD WO CONTRAST  Result Date: 09/06/2020 CLINICAL DATA:  [place] EXAM: CT HEAD WITHOUT CONTRAST TECHNIQUE: Contiguous axial images were obtained from the base of the skull through the vertex without intravenous contrast. COMPARISON:  08/24/2020 FINDINGS: Brain: No evidence of acute infarction, hemorrhage, extra-axial collection, ventriculomegaly, or mass effect. Generalized cerebral atrophy. Periventricular white matter low attenuation likely secondary to microangiopathy. Vascular: Cerebrovascular atherosclerotic calcifications are noted. Skull: Negative for fracture or focal lesion. Sinuses/Orbits: Visualized portions of the orbits are unremarkable. Visualized portions of the paranasal  sinuses are unremarkable. Visualized portions of the mastoid air cells are unremarkable. Other: None. IMPRESSION: 1. No acute intracranial pathology. 2. Chronic microvascular disease and cerebral atrophy. Electronically Signed   By: Kathreen Devoid   On: 09/06/2020 13:50   CT CHEST W CONTRAST  Result Date: 09/06/2020 CLINICAL DATA:  Respiratory failure. EXAM: CT CHEST WITH CONTRAST TECHNIQUE: Multidetector CT imaging of the chest was performed during intravenous contrast administration. CONTRAST:  44mL OMNIPAQUE IOHEXOL 300 MG/ML  SOLN COMPARISON:  Radiograph of same day. FINDINGS: Cardiovascular: Atherosclerosis of thoracic aorta is noted without aneurysm or dissection. Mild cardiomegaly is noted. No pericardial effusion is noted. Mediastinum/Nodes: Thyroid gland and esophagus are unremarkable. 3.8 x 3.5 cm left hilar mass is noted concerning for malignancy or metastatic disease. Lungs/Pleura: No pneumothorax is noted. Minimal right posterior basilar subsegmental atelectasis is noted. Small left pleural effusion is noted with adjacent left basilar subsegmental atelectasis or infiltrate. Upper Abdomen: No acute abnormality. Musculoskeletal: No chest wall abnormality. No acute or significant osseous findings. IMPRESSION: 1. 3.8 x 3.5 cm left hilar mass is noted concerning for malignancy or metastatic  disease. PET scan is recommended for further evaluation. 2. Small left pleural effusion is noted with adjacent left basilar subsegmental atelectasis or infiltrate. 3. Aortic atherosclerosis. Aortic Atherosclerosis (ICD10-I70.0). Electronically Signed   By: Marijo Conception M.D.   On: 09/06/2020 13:49   CT CERVICAL SPINE WO CONTRAST  Result Date: 09/07/2020 CLINICAL DATA:  [place]. EXAM: CT CERVICAL SPINE WITHOUT CONTRAST TECHNIQUE: Multidetector CT imaging of the cervical spine was performed without intravenous contrast. Multiplanar CT image reconstructions were also generated. COMPARISON:  Cervical spine MRI dated  09/07/2020 FINDINGS: Alignment: Mild anterolisthesis at the C6-7 and C7-T1 levels. This is unchanged at the C7-T1 level since 11/19/2005. Skull base and vertebrae: No acute fracture. No primary bone lesion or focal pathologic process. Soft tissues and spinal canal: No prevertebral fluid or swelling. No visible canal hematoma. Disc levels: Multilevel degenerative changes, including facet degenerative changes throughout the cervical spine. Upper chest: Clear lung apices. Other: Right carotid artery atheromatous calcifications and minimal left carotid artery atheromatous calcifications. IMPRESSION: 1. No fracture or traumatic subluxation. 2. Multilevel degenerative changes, as described above. 3. Right carotid artery atheromatous calcifications and minimal left carotid artery atheromatous calcifications. Electronically Signed   By: Claudie Revering M.D.   On: 09/07/2020 12:19   MR BRAIN WO CONTRAST  Result Date: 09/06/2020 CLINICAL DATA:  Encephalopathy EXAM: MRI HEAD WITHOUT CONTRAST TECHNIQUE: Multiplanar, multiecho pulse sequences of the brain and surrounding structures were obtained without intravenous contrast. COMPARISON:  08/24/2020 FINDINGS: Brain: No acute infarct, mass effect or extra-axial collection. No acute or chronic hemorrhage. Hyperintense T2-weighted signal is moderately widespread throughout the white matter. Generalized volume loss without a clear lobar predilection. The midline structures are normal. Vascular: Major flow voids are preserved. Skull and upper cervical spine: Normal calvarium and skull base. Visualized upper cervical spine and soft tissues are normal. Sinuses/Orbits:No paranasal sinus fluid levels or advanced mucosal thickening. No mastoid or middle ear effusion. Normal orbits. IMPRESSION: 1. No acute intracranial abnormality. 2. Moderate chronic small vessel ischemic changes and generalized volume loss. Electronically Signed   By: Ulyses Jarred M.D.   On: 09/06/2020 20:26   MR  THORACIC SPINE WO CONTRAST  Result Date: 09/08/2020 CLINICAL DATA:  Bilateral upper and lower extremity weakness and pain. EXAM: MRI THORACIC AND LUMBAR SPINE WITHOUT CONTRAST TECHNIQUE: Multiplanar and multiecho pulse sequences of the thoracic and lumbar spine were obtained without intravenous contrast. COMPARISON:  CT chest 09/06/2020.  CT abdomen and pelvis 06/21/2017. FINDINGS: MRI THORACIC SPINE FINDINGS Alignment: 0.3 cm anterolisthesis C7 on T1 and trace anterolisthesis T9 on T10. Vertebrae: No fracture, evidence of discitis, or bone lesion. Scattered small Schmorl's nodes are noted. Mild degenerative endplate signal change is seen anteriorly at T9-10 and T10-11. Cord:  Normal signal throughout. Paraspinal and other soft tissues: Left hilar mass is better demonstrated on the patient's recent chest CT. Otherwise negative. Disc levels: T1-2: There is loss of disc space height with a shallow bulge and endplate spur. The central canal is open. Moderate bilateral foraminal narrowing is noted. T2-3: Tiny central protrusion without stenosis. T3-4: Negative. T4-5: Minimal bulge without stenosis. T5-6: Negative. T6-7: Minimal bulge without stenosis. T7-8: Minimal bulge without stenosis. T8-9: Moderate facet arthropathy.  Shallow disc bulge.  No stenosis. T9-10: Moderate facet arthritis moderate to moderately severe facet degenerative change, worse on the left. The disc is uncovered with a shallow bulge and endplate spur and there is some ligamentum flavum thickening. There is mild central canal stenosis. Mild to moderate foraminal narrowing is worse  on the left. T10-11: Left worse than right facet degenerative disease. Minimal disc bulge. Moderate left foraminal narrowing. The central canal and right foramen are open. T11-12: Facet degenerative change.  Otherwise negative. T12-L1: Facet degenerative disease and a minimal disc bulge. No stenosis. MRI LUMBAR SPINE FINDINGS Segmentation:  Standard. Alignment: There is  convex left scoliosis with the apex at approximately L2-3 Vertebrae:  No fracture, evidence of discitis, or bone lesion. Conus medullaris and cauda equina: Conus extends to the L2 level. Conus and cauda equina appear normal. Paraspinal and other soft tissues: Negative. Disc levels: L1-2: There is loss of disc space height. Disc bulge and endplate spur are more prominent to the right. There is also some ligamentum flavum thickening which is worse on the right. Narrowing in the right lateral recess could impact the descending right L2 root. Foramina are open. L2-3: Central protrusion and endplate spur. There is some narrowing in the right subarticular recess and moderate right foraminal narrowing. The left foramen is open. Facet degenerative change is more notable on the right. L3-4: Bilateral facet arthropathy. Broad-based disc bulge and endplate spur. Vacuum disc phenomenon. Moderate central canal stenosis and left worse than right subarticular recess narrowing. Moderately severe bilateral foraminal narrowing. L4-5: There is autologous fusion across the disc interspace and the facets are ankylosed. No bulge or protrusion. No stenosis. L5-S1: Autologous fusion across the disc interspace and ankylosis of the left facets. No stenosis. IMPRESSION: MR THORACIC SPINE IMPRESSION Known left hilar mass is better demonstrated on recent chest CT. Mild central canal stenosis and mild to moderate foraminal narrowing at T9-10. Facet degenerative change at this level is worse on the left. Moderate left foraminal narrowing T10-11. MR LUMBAR SPINE IMPRESSION Convex left scoliosis. Degenerative disc disease at L1-2 results in narrowing in the right subarticular recess which could impact the descending right L2 root. Moderate right foraminal narrowing and narrowing in the right subarticular recess at L2-3 could impact the descending right L3 and exiting right L2 roots. Moderate central canal stenosis and left worse than right  subarticular recess narrowing at L3-4. There is also moderately severe bilateral foraminal narrowing at this level. Electronically Signed   By: Inge Rise M.D.   On: 09/08/2020 11:23   MR LUMBAR SPINE WO CONTRAST  Result Date: 09/08/2020 CLINICAL DATA:  Bilateral upper and lower extremity weakness and pain. EXAM: MRI THORACIC AND LUMBAR SPINE WITHOUT CONTRAST TECHNIQUE: Multiplanar and multiecho pulse sequences of the thoracic and lumbar spine were obtained without intravenous contrast. COMPARISON:  CT chest 09/06/2020.  CT abdomen and pelvis 06/21/2017. FINDINGS: MRI THORACIC SPINE FINDINGS Alignment: 0.3 cm anterolisthesis C7 on T1 and trace anterolisthesis T9 on T10. Vertebrae: No fracture, evidence of discitis, or bone lesion. Scattered small Schmorl's nodes are noted. Mild degenerative endplate signal change is seen anteriorly at T9-10 and T10-11. Cord:  Normal signal throughout. Paraspinal and other soft tissues: Left hilar mass is better demonstrated on the patient's recent chest CT. Otherwise negative. Disc levels: T1-2: There is loss of disc space height with a shallow bulge and endplate spur. The central canal is open. Moderate bilateral foraminal narrowing is noted. T2-3: Tiny central protrusion without stenosis. T3-4: Negative. T4-5: Minimal bulge without stenosis. T5-6: Negative. T6-7: Minimal bulge without stenosis. T7-8: Minimal bulge without stenosis. T8-9: Moderate facet arthropathy.  Shallow disc bulge.  No stenosis. T9-10: Moderate facet arthritis moderate to moderately severe facet degenerative change, worse on the left. The disc is uncovered with a shallow bulge and endplate spur and  there is some ligamentum flavum thickening. There is mild central canal stenosis. Mild to moderate foraminal narrowing is worse on the left. T10-11: Left worse than right facet degenerative disease. Minimal disc bulge. Moderate left foraminal narrowing. The central canal and right foramen are open.  T11-12: Facet degenerative change.  Otherwise negative. T12-L1: Facet degenerative disease and a minimal disc bulge. No stenosis. MRI LUMBAR SPINE FINDINGS Segmentation:  Standard. Alignment: There is convex left scoliosis with the apex at approximately L2-3 Vertebrae:  No fracture, evidence of discitis, or bone lesion. Conus medullaris and cauda equina: Conus extends to the L2 level. Conus and cauda equina appear normal. Paraspinal and other soft tissues: Negative. Disc levels: L1-2: There is loss of disc space height. Disc bulge and endplate spur are more prominent to the right. There is also some ligamentum flavum thickening which is worse on the right. Narrowing in the right lateral recess could impact the descending right L2 root. Foramina are open. L2-3: Central protrusion and endplate spur. There is some narrowing in the right subarticular recess and moderate right foraminal narrowing. The left foramen is open. Facet degenerative change is more notable on the right. L3-4: Bilateral facet arthropathy. Broad-based disc bulge and endplate spur. Vacuum disc phenomenon. Moderate central canal stenosis and left worse than right subarticular recess narrowing. Moderately severe bilateral foraminal narrowing. L4-5: There is autologous fusion across the disc interspace and the facets are ankylosed. No bulge or protrusion. No stenosis. L5-S1: Autologous fusion across the disc interspace and ankylosis of the left facets. No stenosis. IMPRESSION: MR THORACIC SPINE IMPRESSION Known left hilar mass is better demonstrated on recent chest CT. Mild central canal stenosis and mild to moderate foraminal narrowing at T9-10. Facet degenerative change at this level is worse on the left. Moderate left foraminal narrowing T10-11. MR LUMBAR SPINE IMPRESSION Convex left scoliosis. Degenerative disc disease at L1-2 results in narrowing in the right subarticular recess which could impact the descending right L2 root. Moderate right  foraminal narrowing and narrowing in the right subarticular recess at L2-3 could impact the descending right L3 and exiting right L2 roots. Moderate central canal stenosis and left worse than right subarticular recess narrowing at L3-4. There is also moderately severe bilateral foraminal narrowing at this level. Electronically Signed   By: Inge Rise M.D.   On: 09/08/2020 11:23   DG CHEST PORT 1 VIEW  Addendum Date: 09/06/2020   ADDENDUM REPORT: 09/06/2020 11:03 ADDENDUM: Findings of mass effect in the LEFT lower lobe upon bronchial structures were discussed with the referring physician as outlined below. Findings are more likely due to worsening of pulmonary parenchymal process. CT of the chest was however suggested to further evaluate this finding given rapid change and to exclude the possibility of acute vascular process related to the aorta given the location. These results were called by telephone at the time of interpretation on 09/06/2020 at 11:03 am to provider A Cephus Slater , who verbally acknowledged these results. Electronically Signed   By: Zetta Bills M.D.   On: 09/06/2020 11:03   Result Date: 09/06/2020 CLINICAL DATA:  Cough, hypotension atrial fibrillation EXAM: PORTABLE CHEST 1 VIEW COMPARISON:  August 30, 2020 FINDINGS: Trachea midline. The LEFT lower lobe bronchus is splayed atop an area measuring approximately 8.4 x 6.5 cm with some peripheral airspace disease noted in the LEFT lower lobe with obscured LEFT hemidiaphragm. Airspace process more confluent than on the prior study, likely associated with small effusion in the LEFT lung base. RIGHT chest  is clear. No acute skeletal process. IMPRESSION: Mass effect along the LEFT retrocardiac region up lifting the LEFT mainstem and lower lobe bronchi with some lower lobe bronchial narrowing suggested, associated with airspace disease and effusion. Findings are suspicious first and foremost for developing pulmonary abscess or worsening  pneumonia though there is no air-fluid level seen on the current study. Vascular etiology and aneurysm also considered given the appearance though felt less likely. Would suggest contrasted CT of the chest for further evaluation given the rapid change in the appearance of the LEFT lower lobe since the previous study. A call is out to the referring provider to further discuss findings in the above case. Electronically Signed: By: Zetta Bills M.D. On: 09/06/2020 10:30   DG Chest Port 1 View  Result Date: 08/30/2020 CLINICAL DATA:  Sepsis. EXAM: PORTABLE CHEST 1 VIEW COMPARISON:  08/28/2020 FINDINGS: Cardiomegaly remains stable. Mild infiltrate or atelectasis in the left lung base appears increased since previous study. Linear opacity at the right lung base is stable and may be due to subsegmental atelectasis or scarring. IMPRESSION: Increased mild left basilar infiltrate or atelectasis. Stable right basilar atelectasis versus scarring. Stable cardiomegaly. Electronically Signed   By: Marlaine Hind M.D.   On: 08/30/2020 18:27   DG Hand Complete Right  Result Date: 09/02/2020 CLINICAL DATA:  Medial wrist pain, fall 1 week ago, initial encounter EXAM: RIGHT HAND - COMPLETE 3+ VIEW COMPARISON:  None. FINDINGS: Degenerative changes are noted at the radiocarpal joint the first [facility] joint. Interphalangeal degenerative changes are noted as well. Widening of the scapholunate space is noted better visualized than on the prior wrist films likely related to prior trauma. No acute fracture or dislocation is seen. Mild soft tissue swelling is noted about the wrist. 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 IMPRESSION: Degenerative and posttraumatic changes as described. No acute abnormality noted. Electronically Signed   By: Inez Catalina M.D.   On: 09/02/2020 16:36   DG HIP UNILAT WITH PELVIS 2-3 VIEWS LEFT  Result Date: 09/07/2020 CLINICAL DATA:  Bilateral hip pain after a fall.  Initial encounter.  EXAM: DG HIP (WITH OR WITHOUT PELVIS) 2-3V LEFT; DG HIP (WITH OR WITHOUT PELVIS) 2-3V RIGHT COMPARISON:  Plain films of the hips 08/26/2020. FINDINGS: No acute bony or joint abnormality is seen. 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 IMPRESSION: No acute abnormality. Electronically Signed   By: Inge Rise M.D.   On: 09/07/2020 12:09   DG HIP UNILAT WITH PELVIS 2-3 VIEWS RIGHT  Result Date: 09/07/2020 CLINICAL DATA:  Bilateral hip pain after a fall.  Initial encounter. EXAM: DG HIP (WITH OR WITHOUT PELVIS) 2-3V LEFT; DG HIP (WITH OR WITHOUT PELVIS) 2-3V RIGHT COMPARISON:  Plain films of the hips 08/26/2020. FINDINGS: No acute bony or joint abnormality is seen. 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 IMPRESSION: No acute abnormality. Electronically Signed   By: Inge Rise M.D.   On: 09/07/2020 12:09   VAS [place] UPPER EXTREMITY VENOUS DUPLEX  Result Date: 09/11/2020 UPPER VENOUS STUDY  Indications: Swelling, and Pain Limitations: Line. Comparison Study: No previous Performing Technologist: Vonzell Schlatter RVT  Examination Guidelines: A complete evaluation includes B-mode imaging, spectral Doppler, color Doppler, and power Doppler as needed of all accessible portions of each vessel. Bilateral testing is considered an integral part of a complete examination. Limited examinations for reoccurring indications may be performed as noted.  Right Findings: +----------+------------+---------+-----------+----------+-------+ RIGHT     CompressiblePhasicitySpontaneousPropertiesSummary +----------+------------+---------+-----------+----------+-------+ Subclavian  Yes       Yes                      +----------+------------+---------+-----------+----------+-------+  Left Findings: +----------+------------+---------+-----------+----------+-------+ LEFT      CompressiblePhasicitySpontaneousPropertiesSummary  +----------+------------+---------+-----------+----------+-------+ IJV           Full       Yes       Yes                      +----------+------------+---------+-----------+----------+-------+ Subclavian    Full                                          +----------+------------+---------+-----------+----------+-------+ Axillary      Full                                          +----------+------------+---------+-----------+----------+-------+ Brachial      Full       Yes       Yes                      +----------+------------+---------+-----------+----------+-------+ Radial        Full                                          +----------+------------+---------+-----------+----------+-------+ Ulnar         Full                                          +----------+------------+---------+-----------+----------+-------+ Cephalic      Full                                          +----------+------------+---------+-----------+----------+-------+ Basilic       Full                                          +----------+------------+---------+-----------+----------+-------+  Summary:  Right: No evidence of thrombosis in the subclavian.  Left: No evidence of deep vein thrombosis in the upper extremity. No evidence of superficial vein thrombosis in the upper extremity.  *See table(s) above for measurements and observations.  Diagnosing physician: Ruta Hinds MD Electronically signed by Ruta Hinds MD on 09/11/2020 at 12:15:12 PM.    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 We discussed the natural history of non-small cell lung carcinoma and general treatment, highlighting the role of radiotherapy in the management. We discussed the available radiation techniques, and focused on the details of  logistics and delivery. We reviewed the anticipated acute and late sequelae associated with radiation in this setting. The patient was encouraged to ask questions that were answered to her satisfaction.  I spent {CHL ONC TIME VISIT - GBEEF:[id number]} minutes face to face with the patient and more than 50% of that time was spent in counseling and/or coordination of care.   ------------------------------------------------  Sheral Apley. Tammi Klippel, M.D.

## 2020-09-27 ENCOUNTER — Other Ambulatory Visit: Payer: Self-pay

## 2020-09-27 ENCOUNTER — Ambulatory Visit (HOSPITAL_COMMUNITY): Payer: Medicare Other

## 2020-09-27 ENCOUNTER — Ambulatory Visit (HOSPITAL_COMMUNITY)
Admission: RE | Admit: 2020-09-27 | Discharge: 2020-09-27 | Disposition: A | Discharge status: Home / Self Care | Payer: Medicare Other | Source: Ambulatory Visit | Attending: Pulmonary Disease | Admitting: Pulmonary Disease

## 2020-09-27 DIAGNOSIS — C3492 Malignant neoplasm of unspecified part of left bronchus or lung: Secondary | ICD-10-CM | POA: Insufficient documentation

## 2020-09-27 LAB — GLUCOSE, CAPILLARY: Glucose-Capillary: 153 mg/dL — ABNORMAL HIGH (ref 70–99)

## 2020-09-27 MED ORDER — FLUDEOXYGLUCOSE F - 18 (FDG) INJECTION
9.7000 | Freq: Once | INTRAVENOUS | Status: AC
  Administered 2020-09-27: 9.7 via INTRAVENOUS

## 2020-09-28 ENCOUNTER — Encounter: Payer: Self-pay | Admitting: *Deleted

## 2020-09-28 ENCOUNTER — Other Ambulatory Visit: Payer: Self-pay | Admitting: *Deleted

## 2020-09-28 ENCOUNTER — Inpatient Hospital Stay: Payer: Medicare Other | Admitting: Internal Medicine

## 2020-09-28 ENCOUNTER — Ambulatory Visit
Admission: RE | Admit: 2020-09-28 | Discharge: 2020-09-28 | Disposition: A | Discharge status: Home / Self Care | Payer: Medicare Other | Source: Ambulatory Visit | Attending: Radiation Oncology | Admitting: Radiation Oncology

## 2020-09-28 ENCOUNTER — Inpatient Hospital Stay: Payer: Medicare Other

## 2020-09-28 ENCOUNTER — Telehealth: Payer: Self-pay | Admitting: Internal Medicine

## 2020-09-28 ENCOUNTER — Ambulatory Visit: Payer: Medicare Other | Admitting: Radiation Oncology

## 2020-09-28 DIAGNOSIS — C3492 Malignant neoplasm of unspecified part of left bronchus or lung: Secondary | ICD-10-CM

## 2020-09-28 NOTE — Progress Notes (Signed)
The proposed treatment discussed in cancer conference is for discussion purpose only and is not a binding recommendation. The patient was not physically examined nor present for their treatment options. Therefore, final treatment plans cannot be decided.  ?

## 2020-09-28 NOTE — Telephone Encounter (Signed)
Ms. Almanzar has been rescheduled to see Dr. Julien Nordmann for a telephone visit w/her dtr on 1/31 at 215pm. Pt's daughter has been made aware of the appt.

## 2020-09-29 ENCOUNTER — Telehealth: Payer: Self-pay | Admitting: *Deleted

## 2020-09-29 ENCOUNTER — Telehealth: Payer: Self-pay

## 2020-09-29 ENCOUNTER — Encounter: Payer: Self-pay | Admitting: *Deleted

## 2020-09-29 NOTE — Telephone Encounter (Signed)
Thanks for the update. I called and spoke to daughter and completed documentation.

## 2020-09-29 NOTE — Telephone Encounter (Signed)
I received a message for me to call a navigator at patient's long term care facility.  I called today but was unable to reach anyone or leave a vm message.

## 2020-09-29 NOTE — Progress Notes (Signed)
  Oncology Nurse Navigator Documentation  Oncology Nurse Navigator Flowsheets 09/29/2020  Abnormal Finding Date 09/06/2020  Confirmed Diagnosis Date 09/15/2020  Diagnosis Status Additional Work Up  Navigator Follow Up Date: 10/09/2020  Navigator Follow Up Reason: New Patient Appointment  Navigator Location [facility]  Navigator Encounter Type Other:/ Patient was a no show to her new patient appt yesterday.  Her daughter came and wanted to see Dr. Julien Nordmann. I explained that he sees patient's with families.  He would be able to do a telephone visit and the daughter agreed to this.  Patient is re-scheduled for her new patient visit via phone on 1/31.  I received a message to call the facility navigator due to her having questions. I called but was unable to reach.    Patient Visit Type Other  Treatment Phase Abnormal Scans  Barriers/Navigation Needs Education  Interventions Coordination of Care  Acuity Level 2-Minimal Needs (1-2 Barriers Identified)  Coordination of Care Other  Time Spent with Patient 45

## 2020-09-29 NOTE — Telephone Encounter (Signed)
Pts daughter, Rod Holler, called stating they were just informed today that pts Medicare runs out on Monday 10/02/20 because the pt does not have a dx, therefore they will be discharging her from the nursing home.  Pts daughter states the pt had a PET scan this week and therefore should have a dx and wants this information released to the nursing home to prevent discharge. She further states there were no appointments with dr. Julien Nordmann available until 10/09/20.  Rod Holler states we can either call her or her sister Judson Roch back regarding this. Rod Holler can be reached at [phone].

## 2020-09-29 NOTE — Telephone Encounter (Signed)
I received a message from Dr. Worthy Flank CMA stating that Ashlee Greer's daughter has called back requesting urgent needs.  I called Ashlee Greer whom I spoke with yesterday and I updated her that we have not seen Ashlee Greer yet.  She is not a patient with [place] for now.  I asked that he contact patient's PCP and Dr. Juline Patch office for assistance for patient care needs.  I told Ashlee Greer that I called the nursing home but was unable to reach anyone or leave vm message.  Ashlee Greer, states she understands she is not our patient and does not mean to be persistent.  She did ask if her telephone appt can be moved up.  I stated I will ask Dr. Julien Nordmann if he had earlier available time.

## 2020-10-02 ENCOUNTER — Encounter: Payer: Self-pay | Admitting: *Deleted

## 2020-10-02 NOTE — Progress Notes (Signed)
I received a message from Dr. Julien Nordmann today.  He is able to see Ms. Lutter on Friday.  I notified new patient coordinator to call and schedule.

## 2020-10-03 ENCOUNTER — Telehealth: Payer: Self-pay | Admitting: Internal Medicine

## 2020-10-03 NOTE — Telephone Encounter (Signed)
Pt has been rescheduled for a telephone visit w/Dr. Julien Nordmann on 1/28 at 68am. Pt's daughter has been cld and agreed to the appt date and time.

## 2020-10-06 ENCOUNTER — Telehealth: Payer: Self-pay | Admitting: *Deleted

## 2020-10-06 ENCOUNTER — Inpatient Hospital Stay: Payer: Medicare Other | Attending: Internal Medicine | Admitting: Internal Medicine

## 2020-10-06 DIAGNOSIS — F32A Depression, unspecified: Secondary | ICD-10-CM

## 2020-10-06 DIAGNOSIS — F419 Anxiety disorder, unspecified: Secondary | ICD-10-CM

## 2020-10-06 DIAGNOSIS — C3492 Malignant neoplasm of unspecified part of left bronchus or lung: Secondary | ICD-10-CM

## 2020-10-06 NOTE — Telephone Encounter (Signed)
Received call from pt's daughter, Gerald Leitz. She is calling to let Dr. Julien Nordmann know that they have decided to go with hospice services. Clarise Cruz has contacted [facility] [place]. She expects the initial visit to be on Monday, 10/09/20

## 2020-10-06 NOTE — Progress Notes (Signed)
[place] [phone]   [phone]  PROGRESS NOTE FOR TELEMEDICINE VISITS  Ashlee Greenhouse, MD [address] [place] [address]  I connected with@ on 10/06/20 at  9:30 AM EST by video enabled telemedicine visit and verified that I am speaking with the correct person using two identifiers.   I discussed the limitations, risks, security and privacy concerns of performing an evaluation and management service by telemedicine and the availability of in-person appointments. I also discussed with the patient that there may be a patient responsible charge related to this service. The patient expressed understanding and agreed to proceed.  Other persons participating in the visit and their role in the encounter: daughter, Ashlee Greer  Patient's location: Skilled nursing facility Provider's location: [facility]   REFERRING PHYSICIAN: Dr. Leory Plowman Greer  REASON FOR CONSULTATION:  87 years old white female recently diagnosed with lung cancer.  HPI Ashlee Greer is a 87 y.o. female with past medical history significant for atrial fibrillation, hypertension, hypothyroidism, diabetes mellitus, and anxiety/depression as well as osteoarthritis.  The patient is a never smoker.  She was admitted to the hospital on August 30, 2020 with orthostatic hypotension and syncope.  She was also seen at [facility] few weeks before with questionable TIA, urinary tract infection and pneumonia and treated with a course of antibiotics.  Chest x-ray on August 30, 2020 showed increased mild left basilar infiltrate or atelectasis.  She had CT scan of the chest with contrast on September 06, 2020 and it showed 3.8 x 3.5 cm left hilar mass concerning for malignancy or metastatic disease.  She also had MRI of the brain on September 06, 2020 that showed no evidence of metastatic disease to the brain.  The patient was seen by Dr. Valeta Greer and on September 15, 2020 she underwent  video bronchoscopy with endobronchial ultrasound procedure.  The final pathology ([id number]) showed neoplastic cells present. Immunohistochemistry for TTF-1 is positive. Synaptophysin is positive, suggestive of neuroendocrine differentiation. On the morphology alone, it is difficult to determine whether the neoplastic cells represent a lower grade process (carcinoid) versus a higher grade process (small/large cell neuroendocrine carcinoma). Ki-67 proliferation index was attempted; however, the cells of interest are essentially exhausted on further processing. There are a few cells on the smears with possible non-small cell morphology; these cells are not present in the cell block.  The patient had a PET scan on September 27, 2020 and it showed the left hilar mass mildly hypermetabolic with SUV max of 2.75.  No enlarged or hypermetabolic mediastinal or hilar lymph nodes identified and no evidence of metastatic disease elsewhere. The patient was referred to me for evaluation and recommendation regarding her condition.  She is still a resident at the skilled nursing facility and was unable to come to the office and we conducted the visit through Ashlee Greer video visit with the help of her daughter Ashlee Greer.  The patient continues to complain of generalized weakness and fatigue and discomfort all over her body and a lot of pain on touch.  She had MRI of the thoracolumbar spine that showed degenerative disc disease as well as foraminal narrowing.  She lost around 25 pounds in the last few months.  She is in bed all the time and does not do much.  She denied having any shortness of breath, cough or hemoptysis.  She denied having any headache or visual changes.  She has no nausea, vomiting, diarrhea or constipation. Family history significant for  mother with breast cancer, father had a stroke, sister had breast cancer and brother had colon cancer. The patient is a widow and has 4 children.  She was accompanied today by  her daughter Ashlee Greer during the visit.  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  She has no history for smoking, alcohol or drug abuse.  She lives in [place].  HPI  Past Medical History:  Diagnosis Date  . A-fib ([place])   . Anxiety   . Arthritis   . Depression   . Diabetes mellitus without complication ([place])   . Glaucoma   . Hypertension   . Thyroid disease    hypo    Past Surgical History:  Procedure Laterality Date  . BRONCHIAL NEEDLE ASPIRATION BIOPSY  09/15/2020   Procedure: BRONCHIAL NEEDLE ASPIRATION BIOPSIES;  Surgeon: Ashlee Nash, DO;  Location: [facility] ENDOSCOPY;  Service: Pulmonary;;  . FINE NEEDLE ASPIRATION  09/09/2020   Procedure: FINE NEEDLE ASPIRATION (FNA) LINEAR;  Surgeon: Ashlee Furbish, MD;  Location: [facility] ENDOSCOPY;  Service: Pulmonary;;  . JOINT REPLACEMENT Right    knee replacement  . VIDEO BRONCHOSCOPY WITH ENDOBRONCHIAL ULTRASOUND Left 09/09/2020   Procedure: VIDEO BRONCHOSCOPY WITH ENDOBRONCHIAL ULTRASOUND;  Surgeon: Ashlee Furbish, MD;  Location: [facility] ENDOSCOPY;  Service: Pulmonary;  Laterality: Left;  Marland Kitchen VIDEO BRONCHOSCOPY WITH ENDOBRONCHIAL ULTRASOUND N/A 09/15/2020   Procedure: VIDEO BRONCHOSCOPY WITH ENDOBRONCHIAL ULTRASOUND;  Surgeon: Ashlee Nash, DO;  Location: [facility];  Service: Pulmonary;  Laterality: N/A;    No family history on file.  Social History Social History   Tobacco Use  . Smoking status: Never Smoker  . Smokeless tobacco: Never Used  Substance Use Topics  . Alcohol use: Not Currently  . Drug use: Not Currently    Allergies  Allergen Reactions  . Nisoldipine Shortness Of Breath, Swelling and Other (See Comments)    Possibly made the body swell  . Penicillin G Shortness Of Breath, Swelling and Other (See Comments)    Body swells  . Ace Inhibitors Other (See Comments)    HYPERKALEMIA  . Eliquis [Apixaban] Other (See Comments)    Altered mental status     Current Outpatient  Medications  Medication Sig Dispense Refill  . carvedilol (COREG) 3.125 MG tablet Take 3.125 mg by mouth 2 (two) times daily with a meal.    . citalopram (CELEXA) 40 MG tablet Take 0.5 tablets (20 mg total) by mouth daily. 15 tablet 0  . donepezil (ARICEPT) 10 MG tablet Take 10 mg by mouth at bedtime.    Marland Kitchen latanoprost (XALATAN) 0.005 % ophthalmic solution Place 1 drop into both eyes at bedtime.    Marland Kitchen levothyroxine (SYNTHROID) 50 MCG tablet Take 50 mcg by mouth daily before breakfast.    . metFORMIN (GLUCOPHAGE) 1000 MG tablet Take 1,000 mg by mouth 2 (two) times daily with a meal.    . pantoprazole (PROTONIX) 40 MG tablet Take 40 mg by mouth daily.    . psyllium (METAMUCIL) 58.6 % packet Take 1 packet by mouth at bedtime.    . rivaroxaban (XARELTO) 20 MG TABS tablet Take 1 tablet (20 mg total) by mouth daily with supper. 30 tablet 0   No current facility-administered medications for this visit.    Review of Systems  Constitutional: positive for anorexia, fatigue and weight loss Eyes: negative Ears, nose, mouth, throat, and face: negative Respiratory: positive for dyspnea on exertion Cardiovascular: negative Gastrointestinal: negative Genitourinary:negative Integument/breast: negative  Hematologic/lymphatic: negative Musculoskeletal:positive for arthralgias, back pain, muscle weakness and myalgias Neurological: negative Behavioral/Psych: negative Endocrine: negative Allergic/Immunologic: negative  PERFORMANCE STATUS: ECOG 3  LABORATORY DATA: Lab Results  Component Value Date   WBC 5.7 09/16/2020   HGB 11.8 (L) 09/16/2020   HCT 35.7 (L) 09/16/2020   MCV 93.7 09/16/2020   PLT 371 09/16/2020      Chemistry      Component Value Date/Time   NA 134 (L) 09/16/2020 0110   K 4.8 09/16/2020 0110   CL 101 09/16/2020 0110   CO2 23 09/16/2020 0110   BUN 16 09/16/2020 0110   CREATININE 0.72 09/16/2020 0110      Component Value Date/Time   CALCIUM 8.8 (L) 09/16/2020 0110    ALKPHOS 71 09/12/2020 0127   AST 58 (H) 09/12/2020 0127   ALT 51 (H) 09/12/2020 0127   BILITOT 0.5 09/12/2020 0127       RADIOGRAPHIC STUDIES: EEG  Result Date: 09/07/2020 Lora Havens, MD     09/07/2020  7:42 AM Patient Name: CRISLYN WILLBANKS MRN: [id number] Epilepsy Attending: Lora Havens Referring Physician/Provider: Dr Fayrene Helper Date: 09/06/2020 Duration: 24.03 mins Patient history: 87yo F with ams. EEG to evaluate for seizure Level of alertness: Awake AEDs during EEG study: Clonazapem Technical aspects: This EEG study was done with scalp electrodes positioned according to the 10-20 International system of electrode placement. Electrical activity was acquired at a sampling rate of '500Hz'  and reviewed with a high frequency filter of '70Hz'  and a low frequency filter of '1Hz' . EEG data were recorded continuously and digitally stored. Description: The posterior dominant rhythm consists of 7 Hz activity of moderate voltage (25-35 uV) seen predominantly in posterior head regions, symmetric and reactive to eye opening and eye closing. EEG showed continuous generalized 6-7 Hz theta slowing.Hyperventilation and photic stimulation were not performed.   ABNORMALITY -Continuous slow, generalized - Background slow IMPRESSION: This study is suggestive of mild diffuse encephalopathy, nonspecific etiology. No seizures or epileptiform discharges were seen throughout the recording. Lora Havens   DG Knee 1-2 Views Right  Result Date: 09/07/2020 CLINICAL DATA:  Fall. EXAM: RIGHT KNEE - 1-2 VIEW COMPARISON:  None. FINDINGS: Previous hardware fixation of the lateral tibial plateau. Remote fracture deformity of the proximal fibula noted. No joint effusion identified. Loose body within the suprapatellar joint space measures 8 mm. Moderate tricompartment osteoarthritis is noted with joint space narrowing and marginal spur formation. Chondrocalcinosis identified. IMPRESSION: 1. No acute findings. 2. Remote  fracture deformity of the proximal fibula. 3. Moderate tricompartment osteoarthritis and chondrocalcinosis. Electronically Signed   By: Kerby Moors M.D.   On: 09/07/2020 12:08   CT HEAD WO CONTRAST  Result Date: 09/06/2020 CLINICAL DATA:  [place] EXAM: CT HEAD WITHOUT CONTRAST TECHNIQUE: Contiguous axial images were obtained from the base of the skull through the vertex without intravenous contrast. COMPARISON:  08/24/2020 FINDINGS: Brain: No evidence of acute infarction, hemorrhage, extra-axial collection, ventriculomegaly, or mass effect. Generalized cerebral atrophy. Periventricular white matter low attenuation likely secondary to microangiopathy. Vascular: Cerebrovascular atherosclerotic calcifications are noted. Skull: Negative for fracture or focal lesion. Sinuses/Orbits: Visualized portions of the orbits are unremarkable. Visualized portions of the paranasal sinuses are unremarkable. Visualized portions of the mastoid air cells are unremarkable. Other: None. IMPRESSION: 1. No acute intracranial pathology. 2. Chronic microvascular disease and cerebral atrophy. Electronically Signed   By: Kathreen Devoid   On: 09/06/2020 13:50   CT CHEST W CONTRAST  Result Date: 09/06/2020 CLINICAL DATA:  Respiratory failure. EXAM: CT CHEST WITH CONTRAST TECHNIQUE: Multidetector CT imaging of the chest was performed during intravenous contrast administration. CONTRAST:  26m OMNIPAQUE IOHEXOL 300 MG/ML  SOLN COMPARISON:  Radiograph of same day. FINDINGS: Cardiovascular: Atherosclerosis of thoracic aorta is noted without aneurysm or dissection. Mild cardiomegaly is noted. No pericardial effusion is noted. Mediastinum/Nodes: Thyroid gland and esophagus are unremarkable. 3.8 x 3.5 cm left hilar mass is noted concerning for malignancy or metastatic disease. Lungs/Pleura: No pneumothorax is noted. Minimal right posterior basilar subsegmental atelectasis is noted. Small left pleural effusion is noted with adjacent left  basilar subsegmental atelectasis or infiltrate. Upper Abdomen: No acute abnormality. Musculoskeletal: No chest wall abnormality. No acute or significant osseous findings. IMPRESSION: 1. 3.8 x 3.5 cm left hilar mass is noted concerning for malignancy or metastatic disease. PET scan is recommended for further evaluation. 2. Small left pleural effusion is noted with adjacent left basilar subsegmental atelectasis or infiltrate. 3. Aortic atherosclerosis. Aortic Atherosclerosis (ICD10-I70.0). Electronically Signed   By: JMarijo ConceptionM.D.   On: 09/06/2020 13:49   CT CERVICAL SPINE WO CONTRAST  Result Date: 09/07/2020 CLINICAL DATA:  [place] EXAM: CT CERVICAL SPINE WITHOUT CONTRAST TECHNIQUE: Multidetector CT imaging of the cervical spine was performed without intravenous contrast. Multiplanar CT image reconstructions were also generated. COMPARISON:  Cervical spine MRI dated 09/07/2020 FINDINGS: Alignment: Mild anterolisthesis at the C6-7 and C7-T1 levels. This is unchanged at the C7-T1 level since 11/19/2005. Skull base and vertebrae: No acute fracture. No primary bone lesion or focal pathologic process. Soft tissues and spinal canal: No prevertebral fluid or swelling. No visible canal hematoma. Disc levels: Multilevel degenerative changes, including facet degenerative changes throughout the cervical spine. Upper chest: Clear lung apices. Other: Right carotid artery atheromatous calcifications and minimal left carotid artery atheromatous calcifications. IMPRESSION: 1. No fracture or traumatic subluxation. 2. Multilevel degenerative changes, as described above. 3. Right carotid artery atheromatous calcifications and minimal left carotid artery atheromatous calcifications. Electronically Signed   By: SClaudie ReveringM.D.   On: 09/07/2020 12:19   MR BRAIN WO CONTRAST  Result Date: 09/06/2020 CLINICAL DATA:  Encephalopathy EXAM: MRI HEAD WITHOUT CONTRAST TECHNIQUE: Multiplanar, multiecho pulse sequences of the brain  and surrounding structures were obtained without intravenous contrast. COMPARISON:  08/24/2020 FINDINGS: Brain: No acute infarct, mass effect or extra-axial collection. No acute or chronic hemorrhage. Hyperintense T2-weighted signal is moderately widespread throughout the white matter. Generalized volume loss without a clear lobar predilection. The midline structures are normal. Vascular: Major flow voids are preserved. Skull and upper cervical spine: Normal calvarium and skull base. Visualized upper cervical spine and soft tissues are normal. Sinuses/Orbits:No paranasal sinus fluid levels or advanced mucosal thickening. No mastoid or middle ear effusion. Normal orbits. IMPRESSION: 1. No acute intracranial abnormality. 2. Moderate chronic small vessel ischemic changes and generalized volume loss. Electronically Signed   By: KUlyses JarredM.D.   On: 09/06/2020 20:26   MR THORACIC SPINE WO CONTRAST  Result Date: 09/08/2020 CLINICAL DATA:  Bilateral upper and lower extremity weakness and pain. EXAM: MRI THORACIC AND LUMBAR SPINE WITHOUT CONTRAST TECHNIQUE: Multiplanar and multiecho pulse sequences of the thoracic and lumbar spine were obtained without intravenous contrast. COMPARISON:  CT chest 09/06/2020.  CT abdomen and pelvis 06/21/2017. FINDINGS: MRI THORACIC SPINE FINDINGS Alignment: 0.3 cm anterolisthesis C7 on T1 and trace anterolisthesis T9 on T10. Vertebrae: No fracture, evidence of discitis, or bone lesion. Scattered small Schmorl's nodes are noted. Mild degenerative endplate signal change is seen anteriorly at  T9-10 and T10-11. Cord:  Normal signal throughout. Paraspinal and other soft tissues: Left hilar mass is better demonstrated on the patient's recent chest CT. Otherwise negative. Disc levels: T1-2: There is loss of disc space height with a shallow bulge and endplate spur. The central canal is open. Moderate bilateral foraminal narrowing is noted. T2-3: Tiny central protrusion without stenosis.  T3-4: Negative. T4-5: Minimal bulge without stenosis. T5-6: Negative. T6-7: Minimal bulge without stenosis. T7-8: Minimal bulge without stenosis. T8-9: Moderate facet arthropathy.  Shallow disc bulge.  No stenosis. T9-10: Moderate facet arthritis moderate to moderately severe facet degenerative change, worse on the left. The disc is uncovered with a shallow bulge and endplate spur and there is some ligamentum flavum thickening. There is mild central canal stenosis. Mild to moderate foraminal narrowing is worse on the left. T10-11: Left worse than right facet degenerative disease. Minimal disc bulge. Moderate left foraminal narrowing. The central canal and right foramen are open. T11-12: Facet degenerative change.  Otherwise negative. T12-L1: Facet degenerative disease and a minimal disc bulge. No stenosis. MRI LUMBAR SPINE FINDINGS Segmentation:  Standard. Alignment: There is convex left scoliosis with the apex at approximately L2-3 Vertebrae:  No fracture, evidence of discitis, or bone lesion. Conus medullaris and cauda equina: Conus extends to the L2 level. Conus and cauda equina appear normal. Paraspinal and other soft tissues: Negative. Disc levels: L1-2: There is loss of disc space height. Disc bulge and endplate spur are more prominent to the right. There is also some ligamentum flavum thickening which is worse on the right. Narrowing in the right lateral recess could impact the descending right L2 root. Foramina are open. L2-3: Central protrusion and endplate spur. There is some narrowing in the right subarticular recess and moderate right foraminal narrowing. The left foramen is open. Facet degenerative change is more notable on the right. L3-4: Bilateral facet arthropathy. Broad-based disc bulge and endplate spur. Vacuum disc phenomenon. Moderate central canal stenosis and left worse than right subarticular recess narrowing. Moderately severe bilateral foraminal narrowing. L4-5: There is autologous fusion  across the disc interspace and the facets are ankylosed. No bulge or protrusion. No stenosis. L5-S1: Autologous fusion across the disc interspace and ankylosis of the left facets. No stenosis. IMPRESSION: MR THORACIC SPINE IMPRESSION Known left hilar mass is better demonstrated on recent chest CT. Mild central canal stenosis and mild to moderate foraminal narrowing at T9-10. Facet degenerative change at this level is worse on the left. Moderate left foraminal narrowing T10-11. MR LUMBAR SPINE IMPRESSION Convex left scoliosis. Degenerative disc disease at L1-2 results in narrowing in the right subarticular recess which could impact the descending right L2 root. Moderate right foraminal narrowing and narrowing in the right subarticular recess at L2-3 could impact the descending right L3 and exiting right L2 roots. Moderate central canal stenosis and left worse than right subarticular recess narrowing at L3-4. There is also moderately severe bilateral foraminal narrowing at this level. Electronically Signed   By: Inge Rise M.D.   On: 09/08/2020 11:23   MR LUMBAR SPINE WO CONTRAST  Result Date: 09/08/2020 CLINICAL DATA:  Bilateral upper and lower extremity weakness and pain. EXAM: MRI THORACIC AND LUMBAR SPINE WITHOUT CONTRAST TECHNIQUE: Multiplanar and multiecho pulse sequences of the thoracic and lumbar spine were obtained without intravenous contrast. COMPARISON:  CT chest 09/06/2020.  CT abdomen and pelvis 06/21/2017. FINDINGS: MRI THORACIC SPINE FINDINGS Alignment: 0.3 cm anterolisthesis C7 on T1 and trace anterolisthesis T9 on T10. Vertebrae: No fracture, evidence of  discitis, or bone lesion. Scattered small Schmorl's nodes are noted. Mild degenerative endplate signal change is seen anteriorly at T9-10 and T10-11. Cord:  Normal signal throughout. Paraspinal and other soft tissues: Left hilar mass is better demonstrated on the patient's recent chest CT. Otherwise negative. Disc levels: T1-2: There is  loss of disc space height with a shallow bulge and endplate spur. The central canal is open. Moderate bilateral foraminal narrowing is noted. T2-3: Tiny central protrusion without stenosis. T3-4: Negative. T4-5: Minimal bulge without stenosis. T5-6: Negative. T6-7: Minimal bulge without stenosis. T7-8: Minimal bulge without stenosis. T8-9: Moderate facet arthropathy.  Shallow disc bulge.  No stenosis. T9-10: Moderate facet arthritis moderate to moderately severe facet degenerative change, worse on the left. The disc is uncovered with a shallow bulge and endplate spur and there is some ligamentum flavum thickening. There is mild central canal stenosis. Mild to moderate foraminal narrowing is worse on the left. T10-11: Left worse than right facet degenerative disease. Minimal disc bulge. Moderate left foraminal narrowing. The central canal and right foramen are open. T11-12: Facet degenerative change.  Otherwise negative. T12-L1: Facet degenerative disease and a minimal disc bulge. No stenosis. MRI LUMBAR SPINE FINDINGS Segmentation:  Standard. Alignment: There is convex left scoliosis with the apex at approximately L2-3 Vertebrae:  No fracture, evidence of discitis, or bone lesion. Conus medullaris and cauda equina: Conus extends to the L2 level. Conus and cauda equina appear normal. Paraspinal and other soft tissues: Negative. Disc levels: L1-2: There is loss of disc space height. Disc bulge and endplate spur are more prominent to the right. There is also some ligamentum flavum thickening which is worse on the right. Narrowing in the right lateral recess could impact the descending right L2 root. Foramina are open. L2-3: Central protrusion and endplate spur. There is some narrowing in the right subarticular recess and moderate right foraminal narrowing. The left foramen is open. Facet degenerative change is more notable on the right. L3-4: Bilateral facet arthropathy. Broad-based disc bulge and endplate spur. Vacuum  disc phenomenon. Moderate central canal stenosis and left worse than right subarticular recess narrowing. Moderately severe bilateral foraminal narrowing. L4-5: There is autologous fusion across the disc interspace and the facets are ankylosed. No bulge or protrusion. No stenosis. L5-S1: Autologous fusion across the disc interspace and ankylosis of the left facets. No stenosis. IMPRESSION: MR THORACIC SPINE IMPRESSION Known left hilar mass is better demonstrated on recent chest CT. Mild central canal stenosis and mild to moderate foraminal narrowing at T9-10. Facet degenerative change at this level is worse on the left. Moderate left foraminal narrowing T10-11. MR LUMBAR SPINE IMPRESSION Convex left scoliosis. Degenerative disc disease at L1-2 results in narrowing in the right subarticular recess which could impact the descending right L2 root. Moderate right foraminal narrowing and narrowing in the right subarticular recess at L2-3 could impact the descending right L3 and exiting right L2 roots. Moderate central canal stenosis and left worse than right subarticular recess narrowing at L3-4. There is also moderately severe bilateral foraminal narrowing at this level. Electronically Signed   By: Inge Rise M.D.   On: 09/08/2020 11:23   NM PET Image Initial (PI) Skull Base To Thigh  Result Date: 09/28/2020 CLINICAL DATA:  Initial treatment strategy for non-small cell lung cancer. EXAM: NUCLEAR MEDICINE PET SKULL BASE TO THIGH TECHNIQUE: 9.7 mCi F-18 FDG was injected intravenously. Full-ring PET imaging was performed from the skull base to thigh after the radiotracer. CT data was obtained and used  for attenuation correction and anatomic localization. Fasting blood glucose: 153 mg/dl COMPARISON:  Chest CT 09/06/2020 FINDINGS: Mediastinal blood pool activity: SUV max 3.47 Liver activity: SUV max NA NECK: No hypermetabolic lymph nodes in the neck. Incidental CT findings: none CHEST: The left hilar mass is mildly  hypermetabolic with SUV max of 1.96. No enlarged or hypermetabolic mediastinal or hilar lymph nodes are identified. No findings suspicious for pulmonary metastatic disease. Incidental CT findings: Mild cardiac enlargement. Moderate tortuosity, ectasia and calcification of the thoracic aorta. ABDOMEN/PELVIS: No findings suspicious for abdominal/pelvic metastatic disease. No hypermetabolic hepatic or adrenal gland lesions. No abdominal/pelvic lymphadenopathy. Ill-defined area of hypermetabolism noted in the subcutaneous fat overlying the medial aspect of the gluteus maximus muscle on the left side. Hazy interstitial changes are noted in this area. I think this is most likely some type of posttraumatic inflammatory process or possible recent injection. No discrete/discernible mass is identified. SUV max is 5.18. Recommend clinical correlation. Incidental CT findings: Moderate atherosclerotic calcifications, tortuosity and ectasia of the abdominal aorta. Simple left renal cyst. SKELETON: No findings suspicious for osseous metastatic disease. Incidental CT findings: none IMPRESSION: 1. Hypermetabolic left hilar mass consistent with known neoplasm. No hypermetabolic mediastinal or hilar lymphadenopathy and no evidence of metastatic disease elsewhere. 2. Ill-defined area of hypermetabolism in the subcutaneous tissues overlying the left buttock area, likely posttraumatic change but recommend clinical correlation. Electronically Signed   By: Marijo Sanes M.D.   On: 09/28/2020 08:21   DG CHEST PORT 1 VIEW  Addendum Date: 09/06/2020   ADDENDUM REPORT: 09/06/2020 11:03 ADDENDUM: Findings of mass effect in the LEFT lower lobe upon bronchial structures were discussed with the referring physician as outlined below. Findings are more likely due to worsening of pulmonary parenchymal process. CT of the chest was however suggested to further evaluate this finding given rapid change and to exclude the possibility of acute  vascular process related to the aorta given the location. These results were called by telephone at the time of interpretation on 09/06/2020 at 11:03 am to provider A Cephus Slater , who verbally acknowledged these results. Electronically Signed   By: Zetta Bills M.D.   On: 09/06/2020 11:03   Result Date: 09/06/2020 CLINICAL DATA:  Cough, hypotension atrial fibrillation EXAM: PORTABLE CHEST 1 VIEW COMPARISON:  August 30, 2020 FINDINGS: Trachea midline. The LEFT lower lobe bronchus is splayed atop an area measuring approximately 8.4 x 6.5 cm with some peripheral airspace disease noted in the LEFT lower lobe with obscured LEFT hemidiaphragm. Airspace process more confluent than on the prior study, likely associated with small effusion in the LEFT lung base. RIGHT chest is clear. No acute skeletal process. IMPRESSION: Mass effect along the LEFT retrocardiac region up lifting the LEFT mainstem and lower lobe bronchi with some lower lobe bronchial narrowing suggested, associated with airspace disease and effusion. Findings are suspicious first and foremost for developing pulmonary abscess or worsening pneumonia though there is no air-fluid level seen on the current study. Vascular etiology and aneurysm also considered given the appearance though felt less likely. Would suggest contrasted CT of the chest for further evaluation given the rapid change in the appearance of the LEFT lower lobe since the previous study. A call is out to the referring provider to further discuss findings in the above case. Electronically Signed: By: Zetta Bills M.D. On: 09/06/2020 10:30   DG HIP UNILAT WITH PELVIS 2-3 VIEWS LEFT  Result Date: 09/07/2020 CLINICAL DATA:  Bilateral hip pain after a fall.  Initial encounter. EXAM: DG HIP (WITH OR WITHOUT PELVIS) 2-3V LEFT; DG HIP (WITH OR WITHOUT PELVIS) 2-3V RIGHT COMPARISON:  Plain films of the hips 08/26/2020. FINDINGS: No acute bony or joint abnormality is seen. 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 IMPRESSION: No acute abnormality. Electronically Signed   By: Inge Rise M.D.   On: 09/07/2020 12:09   DG HIP UNILAT WITH PELVIS 2-3 VIEWS RIGHT  Result Date: 09/07/2020 CLINICAL DATA:  Bilateral hip pain after a fall.  Initial encounter. EXAM: DG HIP (WITH OR WITHOUT PELVIS) 2-3V LEFT; DG HIP (WITH OR WITHOUT PELVIS) 2-3V RIGHT COMPARISON:  Plain films of the hips 08/26/2020. FINDINGS: No acute bony or joint abnormality is seen. 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 IMPRESSION: No acute abnormality. Electronically Signed   By: Inge Rise M.D.   On: 09/07/2020 12:09   VAS [place] UPPER EXTREMITY VENOUS DUPLEX  Result Date: 09/11/2020 UPPER VENOUS STUDY  Indications: Swelling, and Pain Limitations: Line. Comparison Study: No previous Performing Technologist: Vonzell Schlatter RVT  Examination Guidelines: A complete evaluation includes B-mode imaging, spectral Doppler, color Doppler, and power Doppler as needed of all accessible portions of each vessel. Bilateral testing is considered an integral part of a complete examination. Limited examinations for reoccurring indications may be performed as noted.  Right Findings: +----------+------------+---------+-----------+----------+-------+ RIGHT     CompressiblePhasicitySpontaneousPropertiesSummary +----------+------------+---------+-----------+----------+-------+ Subclavian               Yes       Yes                      +----------+------------+---------+-----------+----------+-------+  Left Findings: +----------+------------+---------+-----------+----------+-------+ LEFT      CompressiblePhasicitySpontaneousPropertiesSummary +----------+------------+---------+-----------+----------+-------+ IJV           Full       Yes       Yes                      +----------+------------+---------+-----------+----------+-------+ Subclavian    Full                                           +----------+------------+---------+-----------+----------+-------+ Axillary      Full                                          +----------+------------+---------+-----------+----------+-------+ Brachial      Full       Yes       Yes                      +----------+------------+---------+-----------+----------+-------+ Radial        Full                                          +----------+------------+---------+-----------+----------+-------+ Ulnar         Full                                          +----------+------------+---------+-----------+----------+-------+ Cephalic      Full                                          +----------+------------+---------+-----------+----------+-------+  Basilic       Full                                          +----------+------------+---------+-----------+----------+-------+  Summary:  Right: No evidence of thrombosis in the subclavian.  Left: No evidence of deep vein thrombosis in the upper extremity. No evidence of superficial vein thrombosis in the upper extremity.  *See table(s) above for measurements and observations.  Diagnosing physician: Ruta Hinds MD Electronically signed by Ruta Hinds MD on 09/11/2020 at 12:15:12 PM.    Final     ASSESSMENT: This is a very pleasant 87 years old white female recently diagnosed with stage Ib/IIa (T2a/b, N0, M0) non-small cell lung cancer with suggestion of neuroendocrine differentiation suspicious for low-grade process versus high-grade process.  There was a limited material for further identification.  This was diagnosed in January 2022.   PLAN: I had a lengthy discussion with the patient and her daughter today about her current condition and treatment options. I personally and independently reviewed the scan images and discussed the results with the patient and her daughter. Based on the imaging studies and the nonconclusive pathology report, this is likely to be low-grade  neoplasm either carcinoid tumor or low-grade adenocarcinoma in a patient with never smoking history. The patient is not a great candidate for surgical resection or any systemic chemotherapy. I recommended for the patient to see radiation oncology for evaluation and consideration of curative radiotherapy to the left hilar mass. I will make a referral for the patient to see radiation oncology in [place] since the patient would like to have her treatment done in [place] rather than [place]. I do not see any role for systemic chemotherapy on this patient at this point. I will see her on as-needed basis but unfortunately her general condition is very poor and she may be a good candidate for palliative care in the near future. The patient and her daughter agreed to the current plan. She was advised to call if she has any other concerning issues. The patient voices understanding of current disease status and treatment options and is in agreement with the current care plan.  All questions were answered. The patient knows to call the clinic with any problems, questions or concerns. We can certainly see the patient much sooner if necessary.   I discussed the assessment and treatment plan with the patient. The patient was provided an opportunity to ask questions and all were answered. The patient agreed with the plan and demonstrated an understanding of the instructions.   The patient was advised to call back or seek an in-person evaluation if the symptoms worsen or if the condition fails to improve as anticipated.  I provided 70 minutes of face-to-face video visit time during this encounter, and > 50% was spent counseling as documented under my assessment & plan.  Eilleen Kempf, MD 10/06/2020 9:35 AM  Disclaimer: This note was dictated with voice recognition software. Similar sounding words can inadvertently be transcribed and may not be corrected upon review.

## 2020-10-09 ENCOUNTER — Ambulatory Visit: Payer: Medicare Other | Admitting: Internal Medicine

## 2020-10-10 ENCOUNTER — Encounter: Payer: Self-pay | Admitting: *Deleted

## 2020-10-10 NOTE — Progress Notes (Signed)
  Oncology Nurse Navigator Documentation  Oncology Nurse Navigator Flowsheets 10/10/2020  Abnormal Finding Date -  Confirmed Diagnosis Date -  Diagnosis Status -  Planned Course of Treatment No Treatment/Observation  Phase of Treatment No Treatment/ Observation  No Treatment Reason: Hospice/per Beth RN note, patient is going with hospice care and daughter has already called them in. Navigation completed  Navigator Follow Up Date: 10/10/2020  Navigator Follow Up Reason: Appointment Review  Navigation Complete Date: 10/10/2020  Post Navigation: Continue to Follow Patient? No  Reason Not Navigating Patient: Hospice/Death  Navigator Location [facility]-  Navigator Encounter Type Other:  Patient Visit Type -  Treatment Phase -  Barriers/Navigation Needs No Barriers At This Time  Interventions -  Acuity -  Coordination of Care -  Time Spent with Patient 15

## 2021-10-10 DEATH — deceased

## 2022-07-27 IMAGING — DX DG CHEST 1V PORT
1 series · 1 of 1 positions shown · non-contrast
Comparison: 08/28/2020

CLINICAL DATA: Sepsis.

EXAM:
PORTABLE CHEST 1 VIEW

[chest ap]
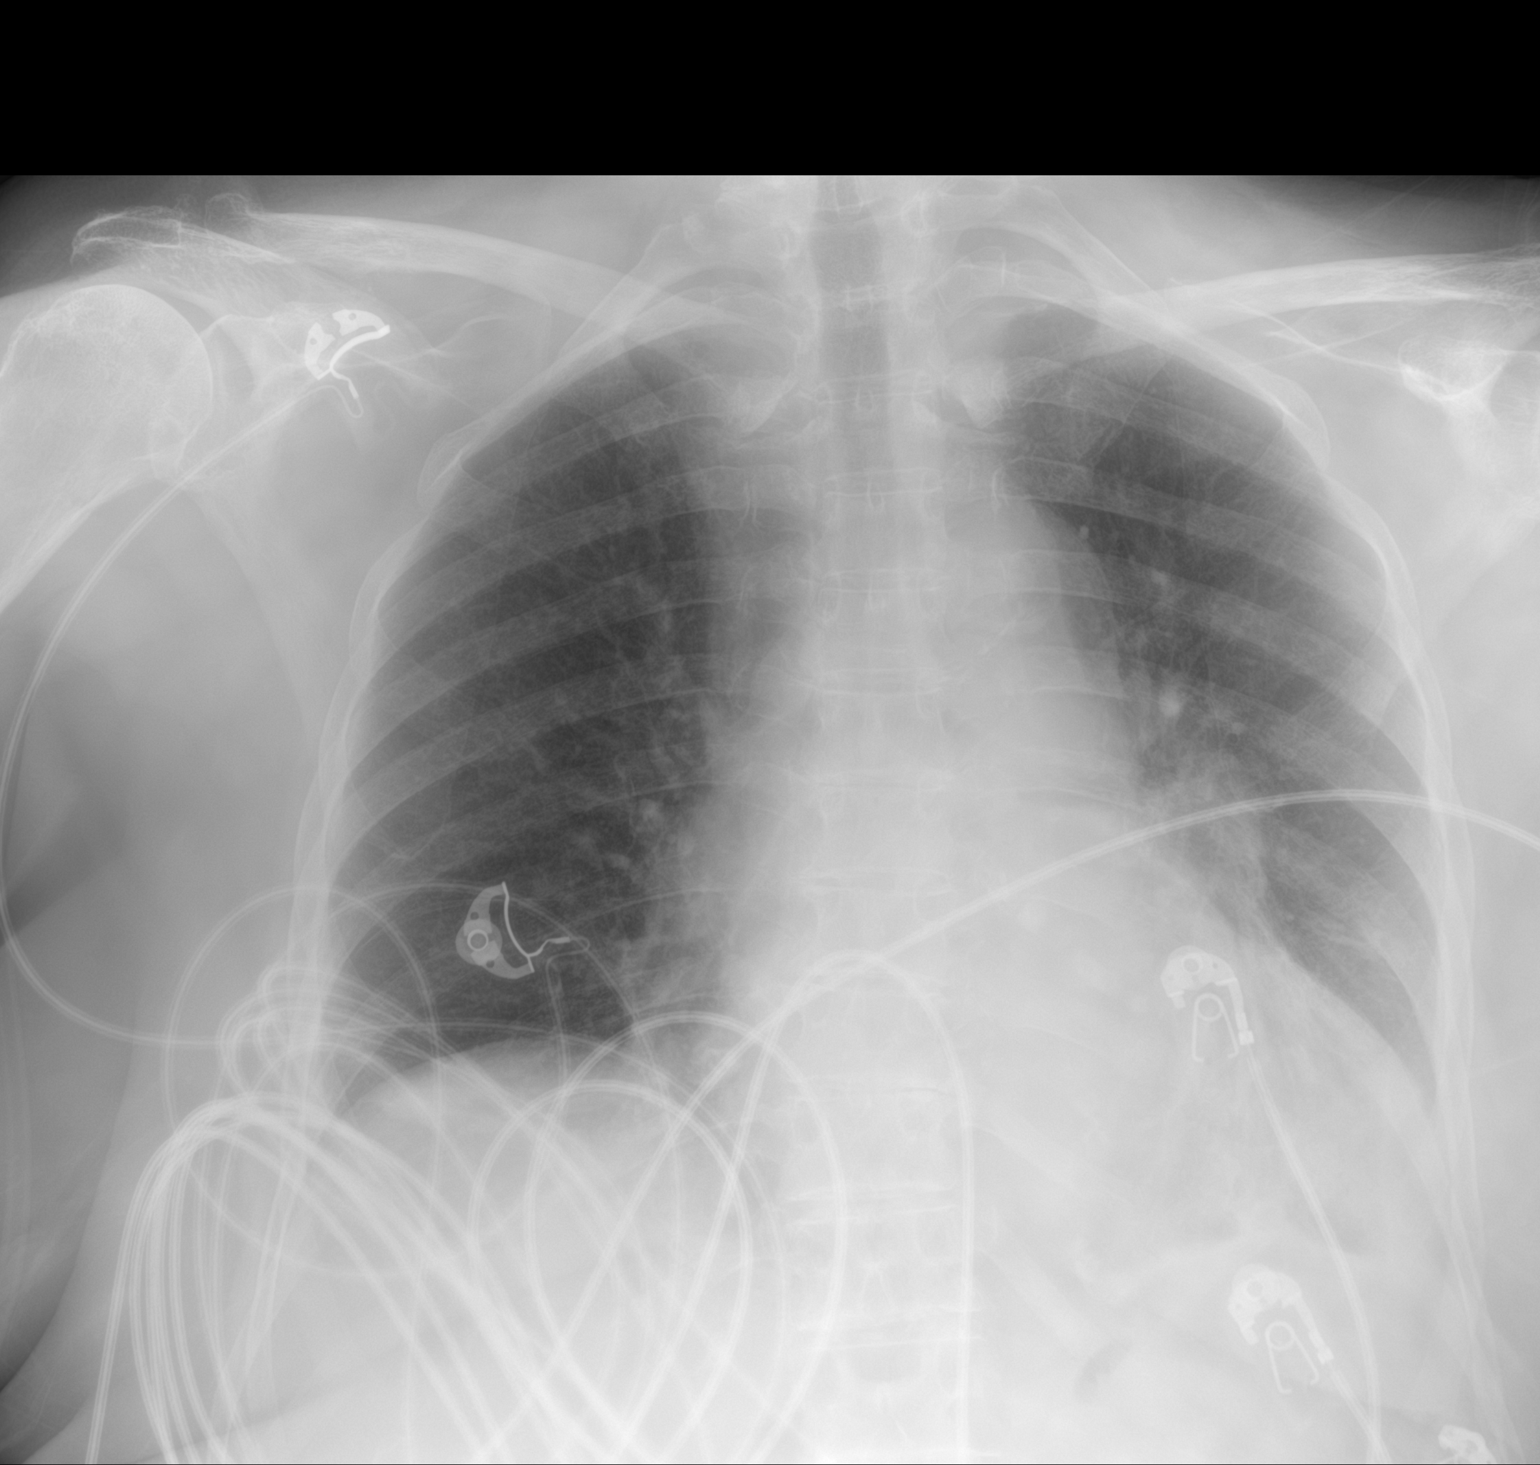

[1 of 1 positions shown; findings below may reference images not displayed]

FINDINGS: Cardiomegaly remains stable. Mild infiltrate or atelectasis in the
left lung base appears increased since previous study. Linear
opacity at the right lung base is stable and may be due to
subsegmental atelectasis or scarring.
IMPRESSION: Increased mild left basilar infiltrate or atelectasis.

Stable right basilar atelectasis versus scarring. Stable
cardiomegaly.

## 2022-08-04 IMAGING — CR DG HIP (WITH OR WITHOUT PELVIS) 2-3V*L*
3 series · 3 of 3 positions shown · non-contrast
Comparison: Plain films of the hips 08/26/2020.

CLINICAL DATA: Bilateral hip pain after a fall.  Initial encounter.

EXAM:
DG HIP (WITH OR WITHOUT PELVIS) 2-3V LEFT; DG HIP (WITH OR WITHOUT
PELVIS) 2-3V RIGHT

[pelvis ap]
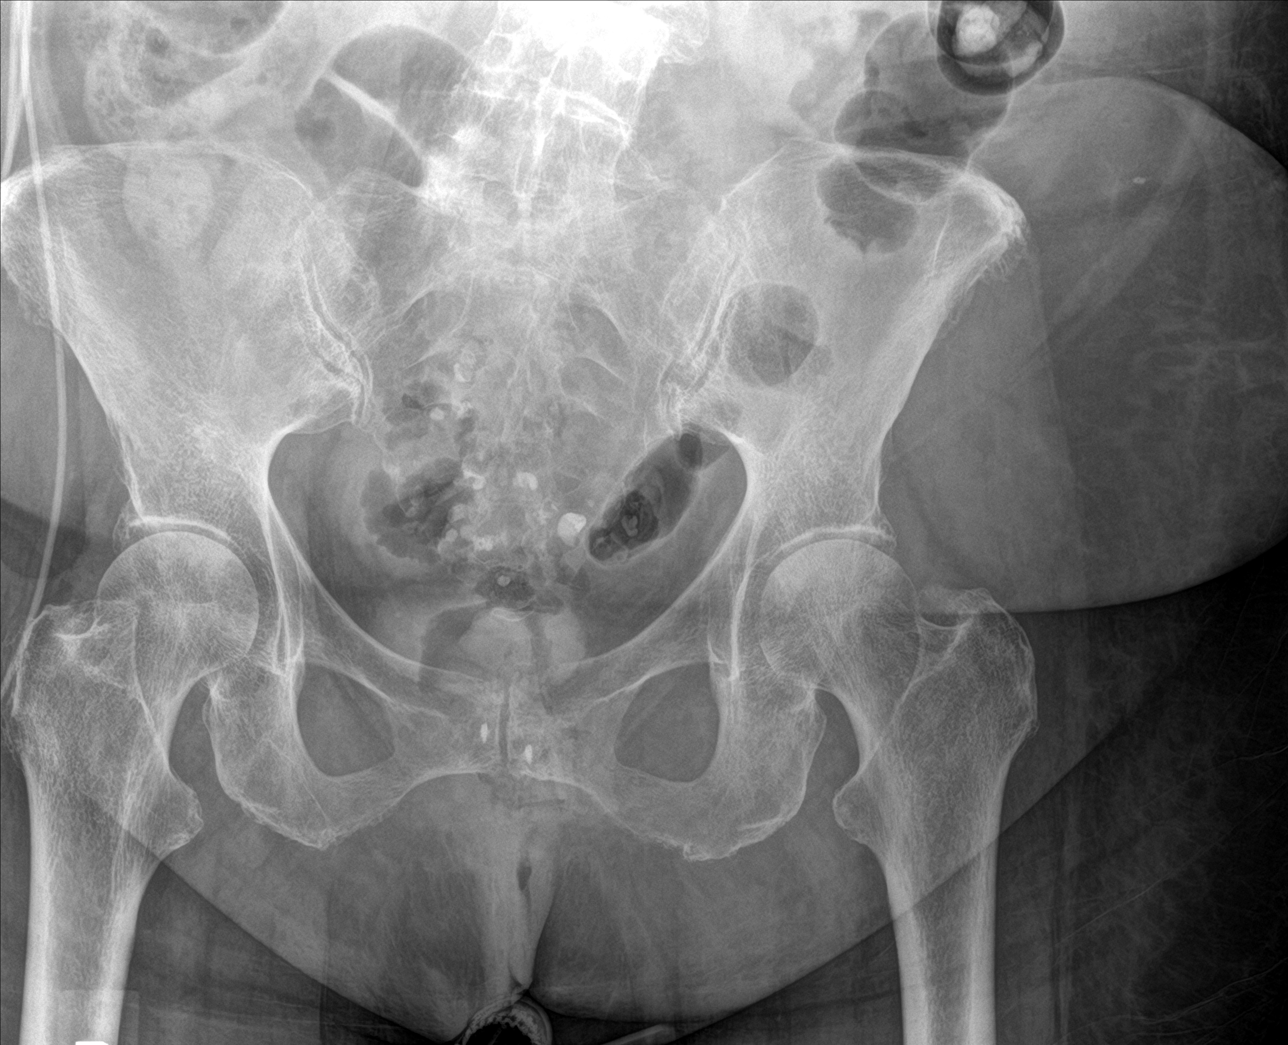

[hip ap]
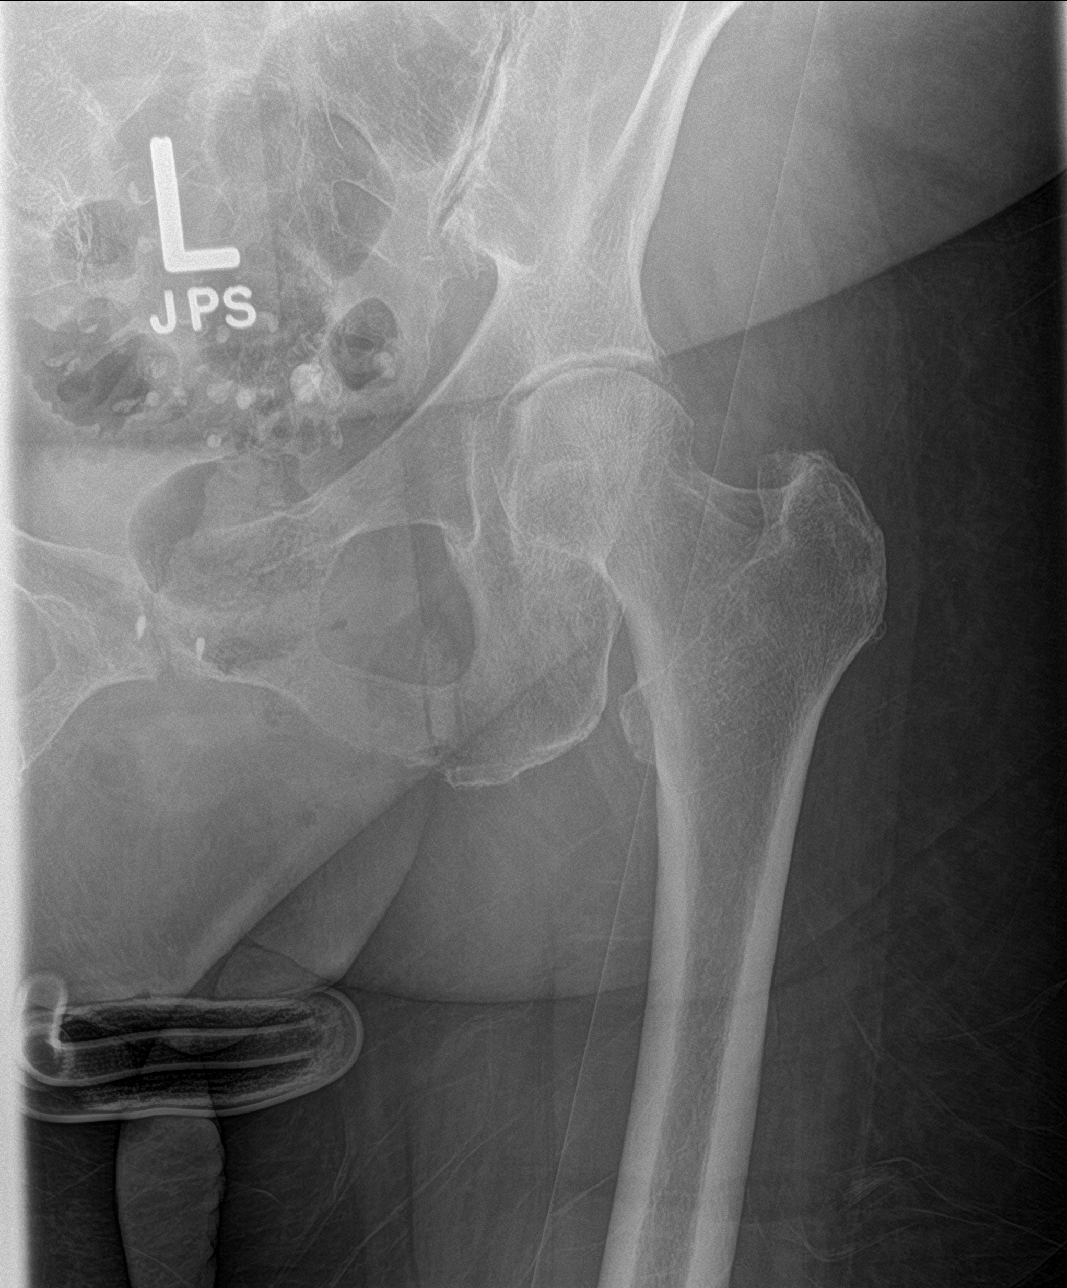

[hip lat]
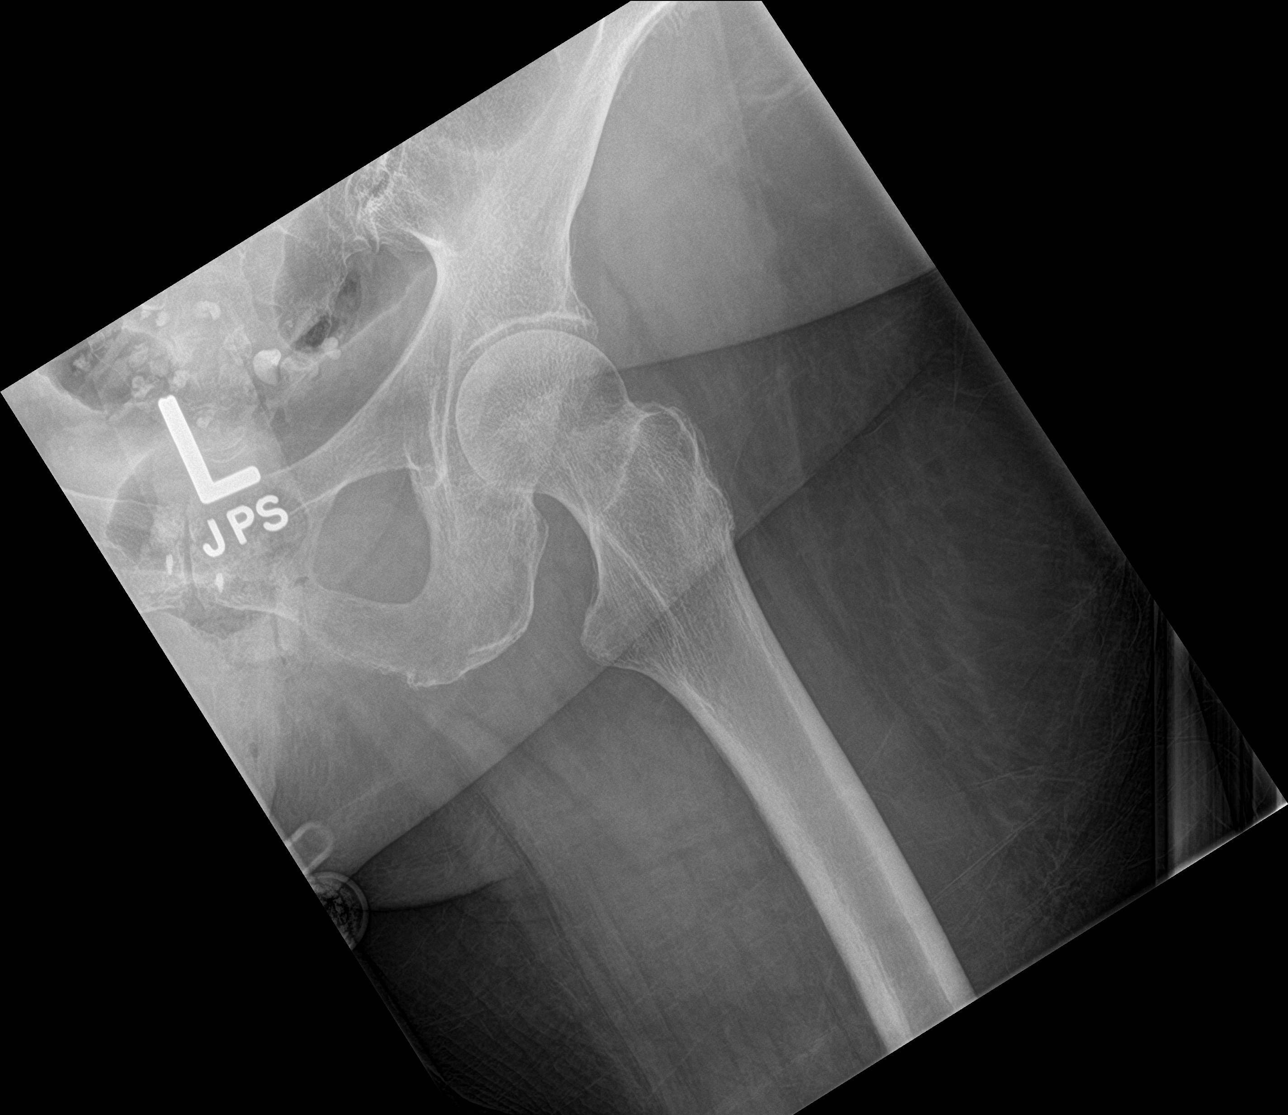

[3 of 3 positions shown; findings below may reference images not displayed]

FINDINGS: No acute bony or joint abnormality is seen. Mild bilateral hip and
SI joint osteoarthritis noted.
IMPRESSION: No acute abnormality.

## 2022-08-04 IMAGING — CR DG KNEE 1-2V*R*
2 series · 2 of 2 positions shown · non-contrast
Comparison: None.

CLINICAL DATA: Fall.

EXAM:
RIGHT KNEE - 1-2 VIEW

[knee ap]
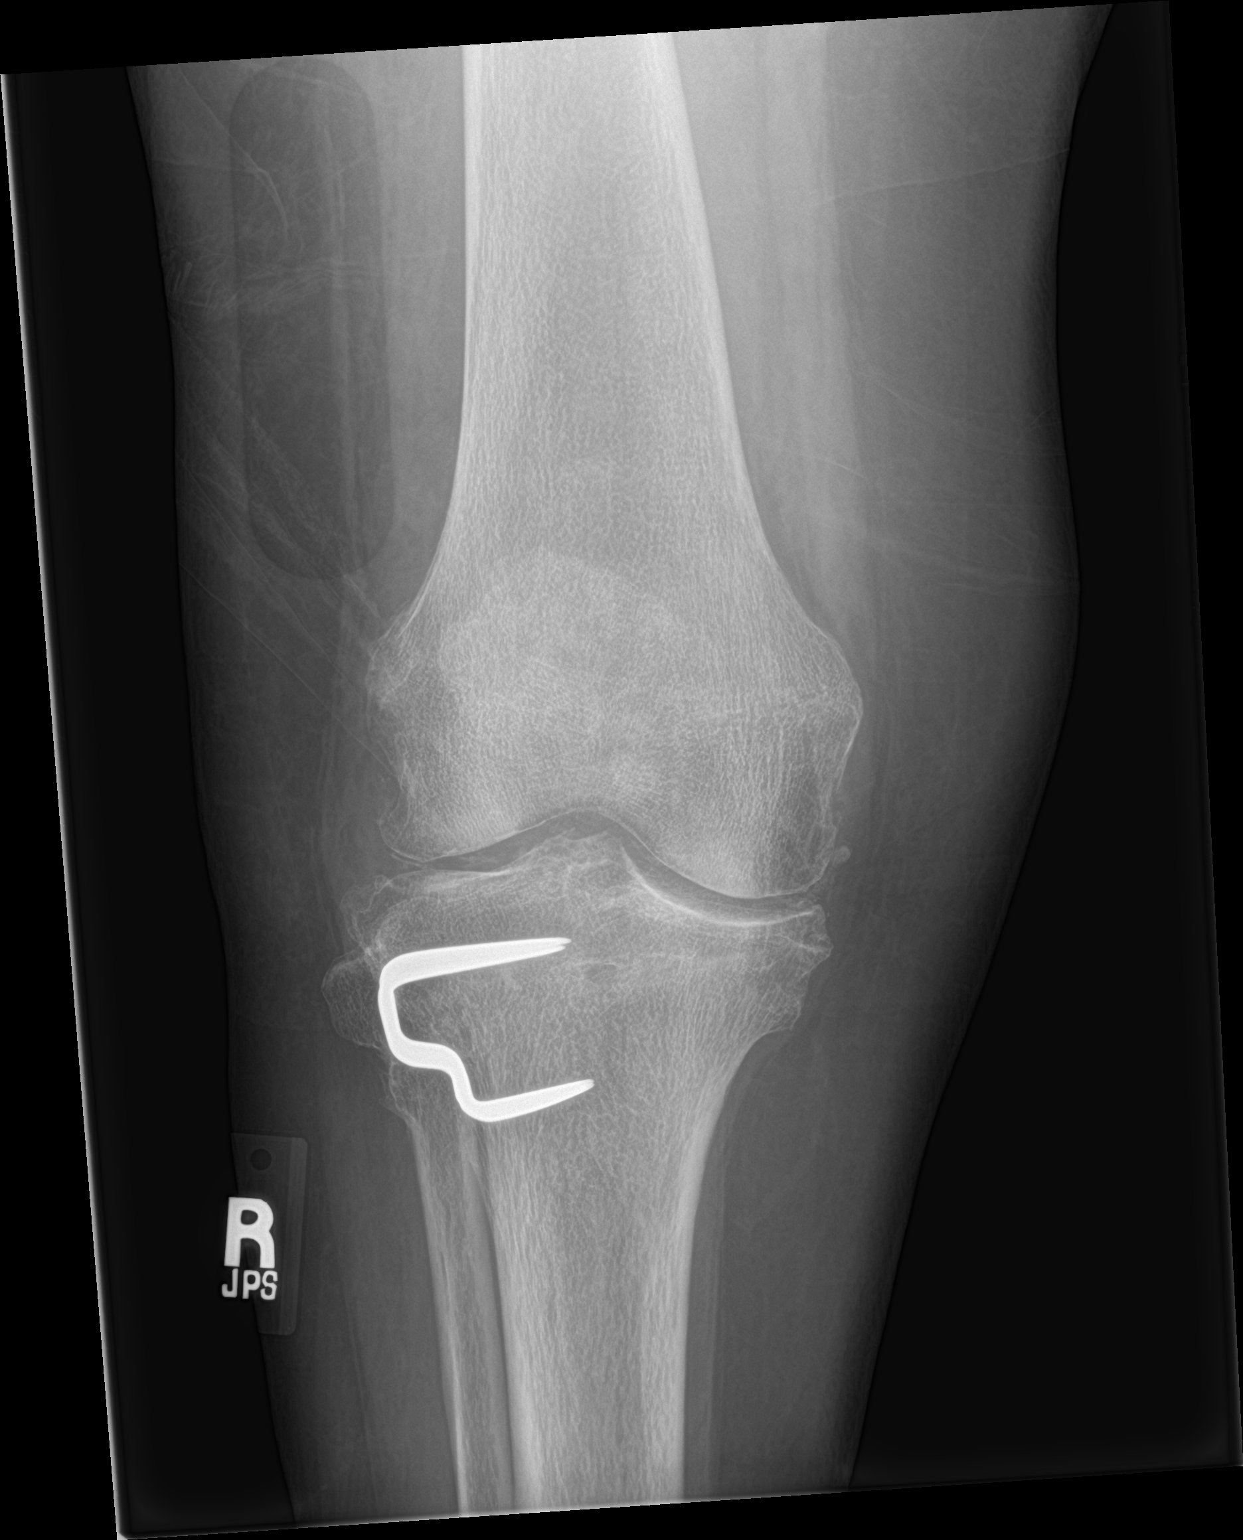

[knee lat]
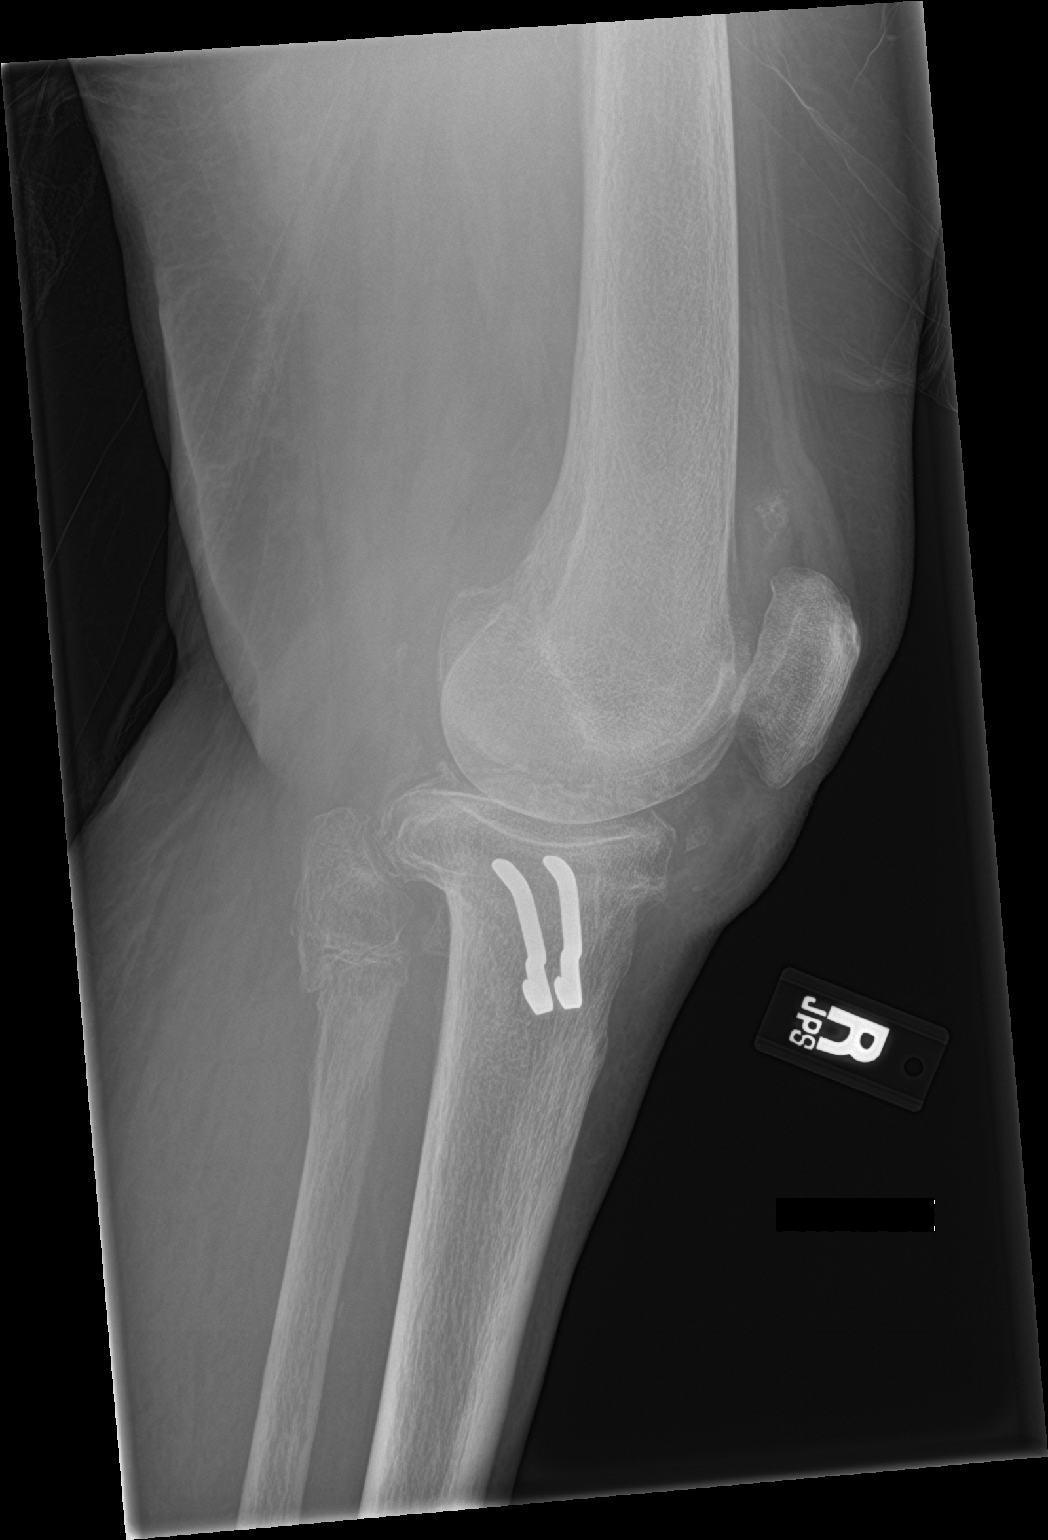

[2 of 2 positions shown; findings below may reference images not displayed]

FINDINGS: Previous hardware fixation of the lateral tibial plateau. Remote
fracture deformity of the proximal fibula noted. No joint effusion
identified. Loose body within the suprapatellar joint space measures
8 mm. Moderate tricompartment osteoarthritis is noted with joint
space narrowing and marginal spur formation. Chondrocalcinosis
identified.
IMPRESSION: 1. No acute findings.
2. Remote fracture deformity of the proximal fibula.
3. Moderate tricompartment osteoarthritis and chondrocalcinosis.
